# Patient Record
Sex: Male | Born: 1945
Health system: Southern US, Community
[De-identification: ages and names within clinical notes are randomized; demographics above are authoritative.]

## PROBLEM LIST (undated history)

## (undated) DIAGNOSIS — N62 Hypertrophy of breast: Secondary | ICD-10-CM

## (undated) DIAGNOSIS — R011 Cardiac murmur, unspecified: Secondary | ICD-10-CM

## (undated) DIAGNOSIS — K219 Gastro-esophageal reflux disease without esophagitis: Secondary | ICD-10-CM

## (undated) DIAGNOSIS — I080 Rheumatic disorders of both mitral and aortic valves: Secondary | ICD-10-CM

## (undated) DIAGNOSIS — C61 Malignant neoplasm of prostate: Secondary | ICD-10-CM

## (undated) DIAGNOSIS — F419 Anxiety disorder, unspecified: Secondary | ICD-10-CM

## (undated) DIAGNOSIS — Z86010 Personal history of colonic polyps: Principal | ICD-10-CM

## (undated) DIAGNOSIS — M4800 Spinal stenosis, site unspecified: Secondary | ICD-10-CM

## (undated) HISTORY — DX: Hypertrophy of breast: N62

## (undated) HISTORY — PX: POLYPECTOMY: SHX149

## (undated) HISTORY — PX: COLONOSCOPY: SHX174

## (undated) HISTORY — PX: TOOTH EXTRACTION: SUR596

## (undated) HISTORY — DX: Spinal stenosis, site unspecified: M48.00

## (undated) HISTORY — DX: Gastro-esophageal reflux disease without esophagitis: K21.9

## (undated) HISTORY — DX: Rheumatic disorders of both mitral and aortic valves: I08.0

## (undated) HISTORY — DX: Anxiety disorder, unspecified: F41.9

## (undated) HISTORY — DX: Malignant neoplasm of prostate: C61

## (undated) HISTORY — DX: Other disorders of bilirubin metabolism: E80.6

## (undated) HISTORY — DX: Cardiac murmur, unspecified: R01.1

## (undated) HISTORY — DX: Personal history of colonic polyps: Z86.010

---

## 1995-08-13 HISTORY — PX: OTHER SURGICAL HISTORY: SHX169

## 2004-10-10 ENCOUNTER — Ambulatory Visit: Payer: Self-pay | Admitting: Internal Medicine

## 2004-11-20 ENCOUNTER — Ambulatory Visit: Payer: Self-pay | Admitting: Internal Medicine

## 2005-11-21 ENCOUNTER — Ambulatory Visit: Payer: Self-pay | Admitting: Internal Medicine

## 2005-12-25 ENCOUNTER — Ambulatory Visit: Payer: Self-pay | Admitting: Internal Medicine

## 2006-04-25 ENCOUNTER — Ambulatory Visit: Payer: Self-pay | Admitting: Internal Medicine

## 2007-03-25 ENCOUNTER — Ambulatory Visit: Payer: Self-pay | Admitting: Internal Medicine

## 2007-03-25 DIAGNOSIS — R9431 Abnormal electrocardiogram [ECG] [EKG]: Secondary | ICD-10-CM | POA: Insufficient documentation

## 2007-03-26 ENCOUNTER — Encounter (INDEPENDENT_AMBULATORY_CARE_PROVIDER_SITE_OTHER): Payer: Self-pay | Admitting: *Deleted

## 2008-05-24 ENCOUNTER — Ambulatory Visit: Payer: Self-pay | Admitting: Internal Medicine

## 2008-05-24 DIAGNOSIS — K219 Gastro-esophageal reflux disease without esophagitis: Secondary | ICD-10-CM

## 2008-05-24 HISTORY — DX: Gastro-esophageal reflux disease without esophagitis: K21.9

## 2008-05-24 LAB — CONVERTED CEMR LAB
Bilirubin Urine: NEGATIVE
Glucose, Urine, Semiquant: NEGATIVE
Ketones, urine, test strip: NEGATIVE
Specific Gravity, Urine: <1.005
pH: 8

## 2008-05-25 ENCOUNTER — Ambulatory Visit: Payer: Self-pay | Admitting: Internal Medicine

## 2008-05-26 ENCOUNTER — Encounter (INDEPENDENT_AMBULATORY_CARE_PROVIDER_SITE_OTHER): Payer: Self-pay | Admitting: *Deleted

## 2008-09-24 LAB — HM COLONOSCOPY: HM Colonoscopy: NORMAL

## 2008-10-21 ENCOUNTER — Ambulatory Visit: Payer: Self-pay | Admitting: Internal Medicine

## 2008-10-21 DIAGNOSIS — R7303 Prediabetes: Secondary | ICD-10-CM | POA: Insufficient documentation

## 2008-10-21 HISTORY — DX: Other disorders of bilirubin metabolism: E80.6

## 2008-10-24 ENCOUNTER — Encounter (INDEPENDENT_AMBULATORY_CARE_PROVIDER_SITE_OTHER): Payer: Self-pay | Admitting: *Deleted

## 2008-10-31 ENCOUNTER — Ambulatory Visit: Payer: Self-pay | Admitting: Cardiovascular Disease

## 2008-11-07 ENCOUNTER — Telehealth (INDEPENDENT_AMBULATORY_CARE_PROVIDER_SITE_OTHER): Payer: Self-pay | Admitting: *Deleted

## 2008-11-08 ENCOUNTER — Ambulatory Visit: Payer: Self-pay

## 2008-11-08 ENCOUNTER — Encounter: Payer: Self-pay | Admitting: Cardiovascular Disease

## 2008-11-08 ENCOUNTER — Encounter: Payer: Self-pay | Admitting: Cardiology

## 2008-11-15 ENCOUNTER — Encounter: Payer: Self-pay | Admitting: Cardiovascular Disease

## 2008-11-15 DIAGNOSIS — I080 Rheumatic disorders of both mitral and aortic valves: Secondary | ICD-10-CM

## 2008-11-15 DIAGNOSIS — I491 Atrial premature depolarization: Secondary | ICD-10-CM | POA: Insufficient documentation

## 2008-11-15 HISTORY — DX: Rheumatic disorders of both mitral and aortic valves: I08.0

## 2008-11-16 ENCOUNTER — Encounter: Payer: Self-pay | Admitting: Cardiovascular Disease

## 2008-11-16 ENCOUNTER — Ambulatory Visit: Payer: Self-pay | Admitting: Cardiovascular Disease

## 2009-05-31 ENCOUNTER — Encounter: Payer: Self-pay | Admitting: Cardiovascular Disease

## 2009-05-31 ENCOUNTER — Ambulatory Visit: Payer: Self-pay | Admitting: Cardiovascular Disease

## 2009-06-26 ENCOUNTER — Telehealth (INDEPENDENT_AMBULATORY_CARE_PROVIDER_SITE_OTHER): Payer: Self-pay | Admitting: *Deleted

## 2009-06-28 ENCOUNTER — Encounter: Payer: Self-pay | Admitting: Internal Medicine

## 2009-07-28 ENCOUNTER — Encounter: Payer: Self-pay | Admitting: Internal Medicine

## 2009-08-01 ENCOUNTER — Encounter: Payer: Self-pay | Admitting: Internal Medicine

## 2009-08-12 HISTORY — PX: PROSTATECTOMY: SHX69

## 2009-08-29 ENCOUNTER — Encounter: Payer: Self-pay | Admitting: Internal Medicine

## 2009-09-06 ENCOUNTER — Telehealth: Payer: Self-pay | Admitting: Cardiovascular Disease

## 2009-09-14 ENCOUNTER — Inpatient Hospital Stay (HOSPITAL_COMMUNITY): Admission: RE | Admit: 2009-09-14 | Discharge: 2009-09-15 | Payer: Self-pay | Admitting: Urology

## 2009-09-14 ENCOUNTER — Encounter (INDEPENDENT_AMBULATORY_CARE_PROVIDER_SITE_OTHER): Payer: Self-pay | Admitting: Urology

## 2009-09-22 ENCOUNTER — Encounter: Payer: Self-pay | Admitting: Internal Medicine

## 2009-11-17 ENCOUNTER — Encounter: Payer: Self-pay | Admitting: Internal Medicine

## 2010-03-21 ENCOUNTER — Encounter: Payer: Self-pay | Admitting: Internal Medicine

## 2010-06-20 ENCOUNTER — Encounter: Payer: Self-pay | Admitting: Internal Medicine

## 2010-06-20 ENCOUNTER — Ambulatory Visit: Payer: Self-pay | Admitting: Internal Medicine

## 2010-06-20 DIAGNOSIS — Z8546 Personal history of malignant neoplasm of prostate: Secondary | ICD-10-CM | POA: Insufficient documentation

## 2010-06-25 LAB — CONVERTED CEMR LAB
Basophils Relative: 1 % (ref 0.0–3.0)
Bilirubin, Direct: 0.1 mg/dL (ref 0.0–0.3)
Direct LDL: 103.6 mg/dL
Eosinophils Absolute: 0.2 10*3/uL (ref 0.0–0.7)
Eosinophils Relative: 4.7 % (ref 0.0–5.0)
GFR calc non Af Amer: 94.84 mL/min (ref >60)
Glucose, Bld: 116 mg/dL — ABNORMAL HIGH (ref 70–99)
HDL: 98.1 mg/dL (ref >39.00)
Hemoglobin: 14.3 g/dL (ref 13.0–17.0)
Lymphocytes Relative: 24.3 % (ref 12.0–46.0)
MCHC: 34.1 g/dL (ref 30.0–36.0)
Neutrophils Relative %: 60.5 % (ref 43.0–77.0)
Platelets: 221 10*3/uL (ref 150.0–400.0)
Potassium: 4.6 meq/L (ref 3.5–5.1)
Sodium: 142 meq/L (ref 135–145)
TSH: 0.65 microintl units/mL (ref 0.35–5.50)
Total Bilirubin: 0.8 mg/dL (ref 0.3–1.2)

## 2010-06-28 LAB — CONVERTED CEMR LAB: Hgb A1c MFr Bld: 5.9 % (ref 4.6–6.5)

## 2010-09-09 LAB — CONVERTED CEMR LAB
ALT: 20 units/L (ref 0–53)
ALT: 33 units/L (ref 0–53)
AST: 23 units/L (ref 0–37)
AST: 33 units/L (ref 0–37)
Albumin: 3.9 g/dL (ref 3.5–5.2)
Alkaline Phosphatase: 55 units/L (ref 39–117)
BUN: 11 mg/dL (ref 6–23)
BUN: 14 mg/dL (ref 6–23)
Basophils Absolute: 0 10*3/uL (ref 0.0–0.1)
Basophils Relative: 0.1 % (ref 0.0–1.0)
Basophils Relative: 1 % (ref 0.0–3.0)
Bilirubin, Direct: 0.1 mg/dL (ref 0.0–0.3)
CO2: 32 meq/L (ref 19–32)
Calcium: 9.7 mg/dL (ref 8.4–10.5)
Chloride: 103 meq/L (ref 96–112)
Cholesterol: 191 mg/dL (ref 0–200)
Eosinophils Absolute: 0.2 10*3/uL (ref 0.0–0.6)
Eosinophils Absolute: 0.2 10*3/uL (ref 0.0–0.7)
Eosinophils Relative: 3.4 % (ref 0.0–5.0)
GFR calc Af Amer: 110 mL/min
GFR calc non Af Amer: 91 mL/min
Glucose, Bld: 103 mg/dL — ABNORMAL HIGH (ref 70–99)
HCT: 40.3 % (ref 39.0–52.0)
HDL: 85.5 mg/dL (ref >39.0)
HDL: 87.9 mg/dL (ref >39.0)
Hemoglobin: 13.2 g/dL (ref 13.0–17.0)
Lymphocytes Relative: 13.2 % (ref 12.0–46.0)
MCHC: 34.6 g/dL (ref 30.0–36.0)
MCV: 90.6 fL (ref 78.0–100.0)
MCV: 94.4 fL (ref 78.0–100.0)
Monocytes Absolute: 0.4 10*3/uL (ref 0.1–1.0)
Monocytes Relative: 6.7 % (ref 3.0–11.0)
PSA: 9.38 ng/mL — ABNORMAL HIGH (ref 0.10–4.00)
RBC: 4.27 M/uL (ref 4.22–5.81)
Sodium: 141 meq/L (ref 135–145)
TSH: 0.87 microintl units/mL (ref 0.35–5.50)
Total Bilirubin: 1.3 mg/dL — ABNORMAL HIGH (ref 0.3–1.2)
VLDL: 15 mg/dL (ref 0–40)
WBC: 4.5 10*3/uL (ref 4.5–10.5)

## 2010-09-11 NOTE — Letter (Signed)
Summary: [facility]  [facility]   Imported By: Lanelle Bal 12/04/2009 13:13:20  _____________________________________________________________________  External Attachment:    Type:   Image     Comment:   External Document

## 2010-09-11 NOTE — Letter (Signed)
Summary: [facility]  [facility]   Imported By: Lanelle Bal 09/29/2009 13:39:09  _____________________________________________________________________  External Attachment:    Type:   Image     Comment:   External Document

## 2010-09-11 NOTE — Letter (Signed)
Summary: [facility]  [facility]   Imported By: Lanelle Bal 03/30/2010 13:11:03  _____________________________________________________________________  External Attachment:    Type:   Image     Comment:   External Document

## 2010-09-11 NOTE — Progress Notes (Signed)
Summary: need surgical clearence  Phone Note From Other Clinic Call back at [phone] ext [phone]   Caller: Ruben Gottron from [facility]  Summary of Call: she is a physical therpist and she wants to do peri anal bio feed back and needs approval from cardiologist and they want this done before his prostate surgery Initial call taken by: Omer Jack,  September 06, 2009 1:36 PM  Follow-up for Phone Call        [facility] for biofeedback and prostate surgery Follow-up by: Colon Branch, MD, [facility],  September 07, 2009 11:44 AM     Appended Document: need surgical clearence NOTE FAXED

## 2010-09-11 NOTE — Letter (Signed)
Summary: [facility]  [facility]   Imported By: Lanelle Bal 09/08/2009 08:15:40  _____________________________________________________________________  External Attachment:    Type:   Image     Comment:   External Document
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
## 2010-09-13 NOTE — Letter (Signed)
Summary: Patient Questionnaire  Patient Questionnaire   Imported By: Lanelle Bal 08/31/2010 10:05:36  _____________________________________________________________________  External Attachment:    Type:   Image     Comment:   External Document

## 2010-09-26 ENCOUNTER — Encounter: Payer: Self-pay | Admitting: Internal Medicine

## 2010-10-09 NOTE — Letter (Signed)
Summary: [facility]  [facility]   Imported By: Maryln Gottron 10/02/2010 15:35:09  _____________________________________________________________________  External Attachment:    Type:   Image     Comment:   External Document

## 2010-10-29 LAB — BASIC METABOLIC PANEL
BUN: 11 mg/dL (ref 6–23)
CO2: 32 mEq/L (ref 19–32)
Calcium: 9.6 mg/dL (ref 8.4–10.5)
Chloride: 101 mEq/L (ref 96–112)
Glucose, Bld: 109 mg/dL — ABNORMAL HIGH (ref 70–99)
Potassium: 4.5 mEq/L (ref 3.5–5.1)

## 2010-10-29 LAB — CBC
HCT: 41.1 % (ref 39.0–52.0)
Hemoglobin: 14 g/dL (ref 13.0–17.0)
RBC: 4.35 MIL/uL (ref 4.22–5.81)
WBC: 5.1 10*3/uL (ref 4.0–10.5)

## 2010-11-01 ENCOUNTER — Encounter: Payer: Self-pay | Admitting: Internal Medicine

## 2010-11-01 ENCOUNTER — Ambulatory Visit (INDEPENDENT_AMBULATORY_CARE_PROVIDER_SITE_OTHER): Payer: Medicare Other | Admitting: Internal Medicine

## 2010-11-01 DIAGNOSIS — IMO0001 Reserved for inherently not codable concepts without codable children: Secondary | ICD-10-CM

## 2010-11-01 DIAGNOSIS — M545 Low back pain, unspecified: Secondary | ICD-10-CM

## 2010-11-01 DIAGNOSIS — IMO0002 Reserved for concepts with insufficient information to code with codable children: Secondary | ICD-10-CM

## 2010-11-01 DIAGNOSIS — M5418 Radiculopathy, sacral and sacrococcygeal region: Secondary | ICD-10-CM

## 2010-11-01 DIAGNOSIS — Z8546 Personal history of malignant neoplasm of prostate: Secondary | ICD-10-CM

## 2010-11-01 LAB — ABO/RH: ABO/RH(D): O POS

## 2010-11-01 LAB — TYPE AND SCREEN
ABO/RH(D): O POS
Antibody Screen: NEGATIVE

## 2010-11-01 LAB — HEMOGLOBIN AND HEMATOCRIT, BLOOD
HCT: 40 % (ref 39.0–52.0)
Hemoglobin: 12.2 g/dL — ABNORMAL LOW (ref 13.0–17.0)
Hemoglobin: 13.8 g/dL (ref 13.0–17.0)

## 2010-11-01 MED ORDER — CYCLOBENZAPRINE HCL 5 MG PO TABS: 5.0000 mg | ORAL_TABLET | Freq: Two times a day (BID) | ORAL | Status: DC | PRN

## 2010-11-01 MED ORDER — HYDROCODONE-ACETAMINOPHEN 7.5-500 MG PO TABS: 2.0000 | ORAL_TABLET | Freq: Four times a day (QID) | ORAL | Status: DC | PRN

## 2010-11-01 NOTE — Progress Notes (Signed)
  Subjective:    Patient ID: Edward Mccall, male    DOB: 16-Oct-1945, 65 y.o.   MRN: [id number]  HPI he presents with low back pain present for one week. It began in the context of lifting weights at a sports facility. He believes it is related to lifting weights and hyperextension the spine using some of the equipment. He has taken hydrocodone as well as nonsteroidals with only partial response. Additionally he is seen a chiropractor for adjustment. Films were made at that facility. He denies any significant past medical history of back problems except for intermittent back strain which was self resolving.     Review of Systems. He denies fever, chills ,sweats, or weight loss. He also denies any rash in the area of the pain.    the pain is described as sharp in the lumbosacral area and radiating into the left lower extremity as far as the ankle.   He denies any loss control of bladder or bowels    he denies abdominal pain, rectal bleeding, melena, or change in bowels.    he has a history of prostate cancer. He denies dysuria, hematuria, or pyuria.    Objective:   Physical Exam he is in obvious discomfort preferring to stand rather than to sit on the exam table. Sitting aggravates the pain particularly in the hamstring area.    he has no jaundice or scleral icterus. There is no rash in the area of the pain.   He is able to walk although he is in discomfort. He can walk on his tiptoes and heels. He is able to lie down on the exam table and sit up without help. Straight leg raising testing is negative at this time.  Deep tendon reflexes are normal and strength is equal and normal in all extremities. He exhibits no cyanosis clubbing or edema.   he has no lymphadenopathy.    he has no abdominal discomfort to palpation.         Assessment & Plan:   #1 he has an acute low back syndrome which has failed to respond to chiropractor  and limited pain medication administration. Clinically an  acute disc is not suggested as he is by his gait and negative straight leg raising.    #2 past medical history of prostate cancer. His last PSA approximately a month ago was undetectable.    plan: #1 he will be asked to take the narcotics on a regular basis of examining pain. Muscle relaxants will also be added. If there is not significant improvement then an   MRI  Of the lumbosacral spine will be performed. As noted although clinically there is no acute disc his history suggests an S1 radiculopathy intermittently.
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
## 2010-11-06 ENCOUNTER — Telehealth: Payer: Self-pay | Admitting: *Deleted

## 2010-11-06 DIAGNOSIS — M5418 Radiculopathy, sacral and sacrococcygeal region: Secondary | ICD-10-CM

## 2010-11-06 DIAGNOSIS — IMO0001 Reserved for inherently not codable concepts without codable children: Secondary | ICD-10-CM

## 2010-11-06 MED ORDER — CYCLOBENZAPRINE HCL 5 MG PO TABS: 5.0000 mg | ORAL_TABLET | Freq: Two times a day (BID) | ORAL | Status: DC | PRN

## 2010-11-06 MED ORDER — HYDROCODONE-ACETAMINOPHEN 7.5-500 MG PO TABS: 2.0000 | ORAL_TABLET | ORAL | Status: DC | PRN

## 2010-11-06 NOTE — Telephone Encounter (Signed)
Spoke w/ pt says that he would like to know what else he can take says Lortab isn't really helping and he is out of flexeril so would like to have refill on meds.

## 2010-11-06 NOTE — Telephone Encounter (Signed)
see referral for CT

## 2010-11-06 NOTE — Telephone Encounter (Signed)
A I have reviewed his medications; there is nothing stronger than the Lortab. Edward Mccall will be increased to every 4 hours. Based on the CT scan referral will be made to the neurosurgeon or to Dr. Ethelene Hal who  is a nonsurgical back specialist.

## 2010-11-06 NOTE — Telephone Encounter (Signed)
Spoke w/ pt aware prescriptions sent to pharmacy

## 2010-11-08 ENCOUNTER — Other Ambulatory Visit: Payer: Self-pay | Admitting: Internal Medicine

## 2010-11-08 ENCOUNTER — Ambulatory Visit
Admission: RE | Admit: 2010-11-08 | Discharge: 2010-11-08 | Disposition: A | Discharge status: Home / Self Care | Payer: Medicare Other | Source: Ambulatory Visit | Attending: Internal Medicine | Admitting: Internal Medicine

## 2010-11-08 ENCOUNTER — Telehealth: Payer: Self-pay | Admitting: Internal Medicine

## 2010-11-08 DIAGNOSIS — M5417 Radiculopathy, lumbosacral region: Secondary | ICD-10-CM

## 2010-11-08 DIAGNOSIS — M5416 Radiculopathy, lumbar region: Secondary | ICD-10-CM

## 2010-11-08 NOTE — Telephone Encounter (Signed)
CT order was place on wrong pt scheduled going for scan today.

## 2010-11-08 NOTE — Telephone Encounter (Signed)
Pt aware of information.  

## 2010-11-09 ENCOUNTER — Telehealth: Payer: Self-pay

## 2010-11-09 NOTE — Telephone Encounter (Signed)
Patient left message on voicemail requesting MRI results  I called patient back and Dr.Hopper gave him the details of his report. Copy to be mailed to patient

## 2010-11-11 HISTORY — PX: LUMBAR DISC SURGERY: SHX700

## 2010-11-13 ENCOUNTER — Telehealth: Payer: Self-pay | Admitting: Internal Medicine

## 2010-11-13 ENCOUNTER — Other Ambulatory Visit: Payer: Self-pay | Admitting: Internal Medicine

## 2010-11-13 DIAGNOSIS — M5416 Radiculopathy, lumbar region: Secondary | ICD-10-CM

## 2010-11-13 MED ORDER — FENTANYL 50 MCG/HR TD PT72: 1.0000 | MEDICATED_PATCH | TRANSDERMAL | Status: AC

## 2010-11-13 NOTE — Telephone Encounter (Signed)
Pt is aware rx is ready °

## 2010-11-13 NOTE — Telephone Encounter (Signed)
As of today, 11-13-2010, patient records still being reviewed by [facility], I informed patient of this, he is aware & is hopeful that Dr. Alwyn Ren can prescribe something different and stronger for the pain until he gets an appt w/Neurosurgery please.

## 2010-11-13 NOTE — Telephone Encounter (Signed)
Fentanyl 50 mg patch every 3 days was prescribed, dispense 5

## 2010-11-13 NOTE — Telephone Encounter (Signed)
Message left on voicemail, Patient is out of pain med and it did not really help. Patient would like to know if there is any other med Dr.Hopper can recommend.  Dr.Hopper please advise

## 2010-11-13 NOTE — Telephone Encounter (Signed)
Patient called to check on status of his referral for back pain that extends down his legs---says he is in constant pain---is there any way to "hurry " this referral----says he is being referred to Dr Danielle Dess (sp?)---please call him if you can do anything for him

## 2010-12-10 ENCOUNTER — Inpatient Hospital Stay (HOSPITAL_COMMUNITY): Payer: Medicare Other

## 2010-12-10 ENCOUNTER — Inpatient Hospital Stay (HOSPITAL_COMMUNITY)
Admission: RE | Admit: 2010-12-10 | Discharge: 2010-12-10 | DRG: 491 | Disposition: A | Discharge status: Home / Self Care | Payer: Medicare Other | Source: Ambulatory Visit | Attending: Neurological Surgery | Admitting: Neurological Surgery

## 2010-12-10 DIAGNOSIS — I059 Rheumatic mitral valve disease, unspecified: Secondary | ICD-10-CM | POA: Diagnosis present

## 2010-12-10 DIAGNOSIS — M47817 Spondylosis without myelopathy or radiculopathy, lumbosacral region: Secondary | ICD-10-CM | POA: Diagnosis present

## 2010-12-10 DIAGNOSIS — K219 Gastro-esophageal reflux disease without esophagitis: Secondary | ICD-10-CM | POA: Diagnosis present

## 2010-12-10 DIAGNOSIS — M5126 Other intervertebral disc displacement, lumbar region: Principal | ICD-10-CM | POA: Diagnosis present

## 2010-12-10 DIAGNOSIS — Z87891 Personal history of nicotine dependence: Secondary | ICD-10-CM

## 2010-12-10 LAB — CBC
HCT: 39.9 % (ref 39.0–52.0)
MCH: 30.9 pg (ref 26.0–34.0)
MCV: 89.5 fL (ref 78.0–100.0)
RDW: 12.3 % (ref 11.5–15.5)

## 2010-12-10 LAB — SURGICAL PCR SCREEN
MRSA, PCR: NEGATIVE
Staphylococcus aureus: POSITIVE — AB

## 2010-12-25 NOTE — Assessment & Plan Note (Signed)
[facility]                            CARDIOLOGY OFFICE NOTE   NAME:Edward Mccall, CINCH ORMOND                      MRN:          [id number]  DATE:10/31/2008                            DOB:          09/30/1945    REFERRING PHYSICIAN:  Titus Dubin. Alwyn Ren, MD,FACP,FCCP   A 65 year old patient referred by Dr. Alwyn Ren for an abnormal EKG.   HISTORY OF PRESENT ILLNESS:  The patient has previously had some  palpitations.  Reviewing his old chart, he has had an event monitor  which showed an occasional PSVT and short burst of PAC.  However, he has  not been on chronic therapy for this.   He did have a nuclear study back in 2000, which was normal with an EF of  53%.   I do not have an old echocardiogram in the chart.   The patient is extremely active.  He works out on a regular basis.  He  is interested in getting back in the triathlons.  He rides a bike on a  regular basis.   He gets occasional shortness of breath, which is measured with the  activity that he does.  He indicates from time to time, he gets a stitch  in his chest, this is actually not described as pain or pressure.  It is  actually more at rest and with stress.   The patient has not had any associated palpitations, diaphoresis,  presyncope, or lower extremity edema.  He does have a history of murmur.   This has not been fully elucidated.   However, when I mentioned it to him, he seemed to indicate that he knew  he has had a murmur in the past.   The patient also indicates that he can drink alcohol on a regular basis.   His father was an alcoholic.   After talking to him at length, I think it may be an issue in regards to  possible cardiomyopathy.   The patient otherwise has been stable in regards to his weight.  He quit  smoking many years ago.   REVIEW OF SYSTEMS:  Otherwise negative.   PAST MEDICAL HISTORY:  Remarkable for previous broken right fibula and  tibial with a plate.   History of BPH, history of GERD, previous colonic  polypectomy, and previous vasectomy.   ALLERGIES:  He has no known allergies.   FAMILY HISTORY:  Father being alcoholic.  Mother having hypertension.   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  He is married.  He has 2 older children.  He does not smoke.  He probably drinks too much, various beverages including beer, Scotch  and mixed drinks.  He can drink particularly heavily on the weekends.  He is a very physically active and fit.   MEDICATIONS:  Include  1. Prilosec.  2. Aspirin.  3. Multivitamins.  4. Fish oil.  5. Glucosamine.  6. Saw palmetto.   PHYSICAL EXAMINATION:  GENERAL:  Remarkable for healthy-appearing male,  in no distress.  VITAL SIGNS:  Blood pressure is 128/70, pulse  70 and regular,  respiratory rate 14, and afebrile.  HEENT:  Unremarkable.  NECK:  Carotids normal without bruit.  No lymphadenopathy, thyromegaly,  or JVP elevation.  LUNGS:  Clear.  Good diaphragmatic motion.  No wheezing.  S1 and S2 with  a systolic ejection murmur.  PMI normal.  ABDOMEN:  Benign.  Bowel sounds positive.  No AAA, no tenderness, no  bruit, no hepatosplenomegaly, and no hepatojugular reflux.  EXTREMITIES:  Distal pulses are intact.  No edema.  NEUROLOGIC:  Nonfocal.  SKIN:  Warm and dry.  MUSCULOSKELETAL:  No muscular weakness status post broken right lower  extremity fibula and tibial.   Dr. Frederik Pear EMR notes were reviewed.   LABORATORY DATA:  Lab work were reviewed.  His LDL is 104, hematocrit  was 40, and creatinine is 1.0.   Electrocardiogram done at Dr. Frederik Pear office is fairly markedly  abnormal.  His EKG back in 2000-2002 appeared normal.  His current EKG  shows fairly marked T-wave inversions in the inferolateral leads.  It is  not clearly related to LVH, T-wave inversion is most remarked in II, III  and F, V5 and V6.   IMPRESSION:  1. Abnormal EKG.  This is a fairly marked  change from before.  We need      to rule out coronary artery disease.  I do not think the stitches      that he is having in his chest are related.  I think these may be      more related to GERD.  Since he has a baseline abnormal EKG, he      will have an exercise stress thallium study.  2. Murmur.  This may be related to his EKG changes as well.  I do not      think he has significant aortic stenosis.  The murmur is actually      more apical and may represent MR.  He will have a 2-D      echocardiogram to further assess this.  3. History of palpitations, relatively stable.  Previous monitor      showing short burst of PSVT.  No indication for beta-blocker      therapy at this time.  4. Alcohol.  Follow up with Dr. Alwyn Ren.  The patient appears to be      drinking too much.  He has an alcoholic history in terms of his      father's previous history.  I would be little bit concerned about      this and I did talk to the patient about this in any relationship      to possible cardiomyopathy.  5. History of gastroesophageal reflux disease, may be related to      alcohol usage.  Continue over-the-counter Prilosec.   Further recommendations will based on his stress test and  echocardiogram.     Theron Arista C. Eden Emms, MD, [facility]  Electronically Signed    PCN/MedQ  DD: 10/31/2008  DT: 11/01/2008  Job #: [id number]

## 2011-01-02 ENCOUNTER — Ambulatory Visit (INDEPENDENT_AMBULATORY_CARE_PROVIDER_SITE_OTHER): Payer: Medicare Other | Admitting: Family Medicine

## 2011-01-02 ENCOUNTER — Encounter: Payer: Self-pay | Admitting: Family Medicine

## 2011-01-02 DIAGNOSIS — M4800 Spinal stenosis, site unspecified: Secondary | ICD-10-CM

## 2011-01-02 DIAGNOSIS — F419 Anxiety disorder, unspecified: Secondary | ICD-10-CM

## 2011-01-02 DIAGNOSIS — F411 Generalized anxiety disorder: Secondary | ICD-10-CM

## 2011-01-02 DIAGNOSIS — Z8546 Personal history of malignant neoplasm of prostate: Secondary | ICD-10-CM

## 2011-01-02 HISTORY — DX: Spinal stenosis, site unspecified: M48.00

## 2011-01-02 HISTORY — DX: Anxiety disorder, unspecified: F41.9

## 2011-01-02 MED ORDER — VENLAFAXINE HCL ER 37.5 MG PO CP24: 75.0000 mg | ORAL_CAPSULE | Freq: Every day | ORAL | Status: DC

## 2011-01-02 MED ORDER — GABAPENTIN 100 MG PO CAPS: 200.0000 mg | ORAL_CAPSULE | Freq: Every day | ORAL | Status: DC

## 2011-01-02 NOTE — Assessment & Plan Note (Signed)
Discussed with pt that this all stems from him losing part of his identity since not being able to cycle as much, warned him it will be a long road but likely will improve. Pt will start on effexor and will titrate up, has valium on hand if needed will return in 2-3 weeks.  Told pt of red flags to look out for.

## 2011-01-02 NOTE — Progress Notes (Signed)
  Subjective:    Patient ID: Edward Mccall, male    DOB: 18-Jun-1946, 65 y.o.   MRN: [id number]  HPI  Pt here to establish care changing providers for PCP.  Main topics of todays visit.   1.  Spinal stenosis-  Pt did have operation on back three weeks ago when having incredible pain and foot drop. Since then had been doing better increased activity again to some planks and rode his bicycle the other day without much problems.  Starting PT this Friday and excited to become more active.  Pt though is still having a lot of pain in the morning, states it is agony and takes about 20-30 minutes to loosen up to allow for him to go about his day.  Pt has tried changing positions while sleeping without much help.  Pt states during the day he has done well and is not taking the meloxicam, percocet or any other pain meds unless he really needs to.  Pt denies any bowel or bladder problems.  Pt does state his left foot does still having a tingling sensation but it has gotten all the strength back. Pt really does not want to be on medications if he can help it.   2.  Anxiety and depression  Pt states that since his surgery and not being as active as he used to be he is having these overwhelming feelings of depression that come and go day by day.  He also feels like he has too much energy and does not know what to do with it. Pt denies  Suicidal and Homicidal ideation  But is not sleeping well, has become teary eyed from time to time.  Pt has not been on any medications before and feels exercise used to help but cannot do it as much.  Pt would be open to see if there is anything that could help.   Review of Systems Denies fever, chills, nausea vomiting abdominal pain, dysuria, chest pain, shortness of breath dyspnea on exertion or numbness in extremities    Past medical history, social, surgical and family history all reviewed without changes.   Objective:   Physical Exam    Gen: NAD HEENT: EOMI, PERRLA CV:  RRR 1/6 SEM Pul: CTAB  ABd: Soft NT Ext: no edema, NVI, full 5/5 strength in all extremities all reflexes in tact, no foot drop appreciated.  Back:  Pt does have incision well healing in mid lumbar region, no pus minimal erythema from healing. Good ROM.  Sensation intact    Assessment & Plan:

## 2011-01-02 NOTE — Assessment & Plan Note (Signed)
S/p surgery, seems to be doing excellent, see his surgeon, no red flags but with the pain he is talking about will have pt start neurontin for short amount of time, pt did not want steroids which could have been some benefit if from swelling. Pt will return in two weeks and we will see how pt is doing.

## 2011-01-02 NOTE — Assessment & Plan Note (Signed)
Discussed with pt did not want to get any labs at this time, no complaints and no problems since surgery.

## 2011-01-02 NOTE — Patient Instructions (Signed)
Edward Mccall it is great we finally got you in I am giving you 2 medicines The first is called neurontin.  I want you to take 1 pill at night for the next week then two pills at night thereafter.  I want you to start Effexor as well.  Take `1 pill daily for the next week then 2 pills daily thereafter Take the percocet if you need it Take the valium if you really need it as well I want to see you again in 2-3 weeks and make sure everything is going well.

## 2011-01-11 HISTORY — PX: LUMBAR DISC SURGERY: SHX700

## 2011-01-24 ENCOUNTER — Other Ambulatory Visit: Payer: Self-pay | Admitting: Neurological Surgery

## 2011-01-24 DIAGNOSIS — M5136 Other intervertebral disc degeneration, lumbar region: Secondary | ICD-10-CM

## 2011-01-24 DIAGNOSIS — M5416 Radiculopathy, lumbar region: Secondary | ICD-10-CM

## 2011-01-28 ENCOUNTER — Ambulatory Visit
Admission: RE | Admit: 2011-01-28 | Discharge: 2011-01-28 | Disposition: A | Discharge status: Home / Self Care | Payer: Medicare Other | Source: Ambulatory Visit | Attending: Neurological Surgery | Admitting: Neurological Surgery

## 2011-01-28 DIAGNOSIS — M5136 Other intervertebral disc degeneration, lumbar region: Secondary | ICD-10-CM

## 2011-01-28 DIAGNOSIS — M5416 Radiculopathy, lumbar region: Secondary | ICD-10-CM

## 2011-01-28 MED ORDER — GADOBENATE DIMEGLUMINE 529 MG/ML IV SOLN
15.0000 mL | Freq: Once | INTRAVENOUS | Status: AC | PRN
  Administered 2011-01-28: 15 mL via INTRAVENOUS

## 2011-02-02 ENCOUNTER — Other Ambulatory Visit (HOSPITAL_COMMUNITY): Payer: Self-pay | Admitting: Neurological Surgery

## 2011-02-02 ENCOUNTER — Ambulatory Visit (HOSPITAL_COMMUNITY)
Admission: EM | Admit: 2011-02-02 | Discharge: 2011-02-02 | Disposition: A | Discharge status: Home / Self Care | Payer: Medicare Other | Source: Ambulatory Visit | Attending: Neurological Surgery | Admitting: Neurological Surgery

## 2011-02-02 ENCOUNTER — Ambulatory Visit (HOSPITAL_COMMUNITY): Admission: EM | Admit: 2011-02-02 | Payer: Medicare Other | Source: Ambulatory Visit | Admitting: Neurological Surgery

## 2011-02-02 ENCOUNTER — Ambulatory Visit (HOSPITAL_COMMUNITY)
Admission: RE | Admit: 2011-02-02 | Discharge: 2011-02-02 | Disposition: A | Discharge status: Home / Self Care | Payer: Medicare Other | Source: Ambulatory Visit | Attending: Neurological Surgery | Admitting: Neurological Surgery

## 2011-02-02 ENCOUNTER — Ambulatory Visit (HOSPITAL_COMMUNITY): Payer: Medicare Other

## 2011-02-02 DIAGNOSIS — M545 Low back pain, unspecified: Secondary | ICD-10-CM

## 2011-02-02 DIAGNOSIS — Z01810 Encounter for preprocedural cardiovascular examination: Secondary | ICD-10-CM | POA: Insufficient documentation

## 2011-02-02 DIAGNOSIS — R52 Pain, unspecified: Secondary | ICD-10-CM

## 2011-02-02 DIAGNOSIS — Z01812 Encounter for preprocedural laboratory examination: Secondary | ICD-10-CM | POA: Insufficient documentation

## 2011-02-02 DIAGNOSIS — M5126 Other intervertebral disc displacement, lumbar region: Secondary | ICD-10-CM | POA: Insufficient documentation

## 2011-02-02 DIAGNOSIS — Z79899 Other long term (current) drug therapy: Secondary | ICD-10-CM | POA: Insufficient documentation

## 2011-02-02 LAB — BASIC METABOLIC PANEL
Chloride: 103 mEq/L (ref 96–112)
GFR calc Af Amer: >60 mL/min (ref >60)
Potassium: 3.8 mEq/L (ref 3.5–5.1)

## 2011-02-02 LAB — CBC
Platelets: 184 10*3/uL (ref 150–400)
RDW: 12.3 % (ref 11.5–15.5)
WBC: 4.8 10*3/uL (ref 4.0–10.5)

## 2011-02-14 NOTE — Op Note (Signed)
[name], Mccall               ACCOUNT NO.:  [id number]  MEDICAL RECORD NO.:  [id number]           PATIENT TYPE:  I  LOCATION:  3524                         FACILITY:  [facility]  PHYSICIAN:  Stefani Dama, M.D.  DATE OF BIRTH:  1946/07/06  DATE OF PROCEDURE:  12/10/2010 DATE OF DISCHARGE:  12/10/2010                              OPERATIVE REPORT   PREOPERATIVE DIAGNOSIS:  Herniated nucleus pulposus at L4-L5 on the left with spondylosis and left lumbar radiculopathy including foot drop.  POSTOPERATIVE DIAGNOSIS:  Herniated nucleus pulposus at L4-L5 on the left with spondylosis and left lumbar radiculopathy including footdrop.  PROCEDURE:  Microdiskectomy at L4-L5 on the left with operating microscope and microdissection technique.  SURGEON:  Stefani Dama, MD  FIRST ASSISTANT:  Clydene Fake, MD  ANESTHESIA:  General endotracheal.  INDICATIONS:  Edward Mccall is a 65 year old individual who has had significant back and left lower extremity pain with weakness including a footdrop.  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  In addition, the pain has been insurmountable despite efforts at conservative treatment.  He has been requiring high doses of oral narcotic analgesics to maintain some level of comfort which is very transient.  The patient is taken to the operating room to undergo surgical decompression.  PROCEDURE:  The patient was brought to the operating room supine on the stretcher.  After smooth induction of general endotracheal anesthesia, he was turned prone.  Back was prepped with alcohol and DuraPrep and draped in a sterile fashion.  A midline incision was created and carried down through the lumbodorsal fascia after localizing the L4-L5 space. Dissection was then carried out subperiosteally at L4-L5 to allow placement of a self-retaining retractor.  A laminotomy was then created removing the inferior margin of lamina of L4 out to and  including the medial wall of the facet.  The yellow ligament was identified and this was then taken up.  It was noted to be very thickened and redundant.  As the lateral recess was uncovered, it was noted be flattening at the takeoff of the L5 nerve root and with further dissection we were able to mobilize the L5 nerve root off a mass which was noted to be a significant protrusion of the disk right over the area of the disk space.  This was incised vertically and under modest amount of pressure some fragments of severely degenerating and desiccated disk material were encountered.  These were removed and allowed for good decompression of the nerve root.  Further exploration, however, yielded that there was direct communication with the disk space and then a diskectomy was ensued removing continual significant fragmented desiccated disk material from within the disk space.  Ultimately, a substantial diskectomy of the disk space was performed removing all of the fragments of degenerated disk material both medially and laterally from within thedisk space.  The space collapsed a bit and the lateral recess for the L4 nerve root was explored.  Some ligamentous material was taken up in this area with 2 and 3 mm Kerrison punch.  Dissection was then carried out laterally and  hemostasis was achieved.  The wound was irrigated copiously with antibiotic irrigating solution and then the common dural tube and the L5 nerve root was well decompressed.  The L4 nerve root was well decompressed and with this final exploration was undertaken.  At this point, it was noted that there was a small arachnoidal appearance at the dura near the midline.  The bone around this area was enlarged and this was then closed with a singular horizontal mattress suture.  No CSF leak was encountered, however, to be certain that this would remain closed a small layer of DuraSeal was laid over this area.  Further exploration  yielded no other rents in the dura and again identified good decompression of the nerve roots.  The wound was then copiously irrigated with antibiotic irrigating solution.  The lumbodorsal fascia was closed with 2-0 Vicryl in an interrupted fashion, 2-0 Vicryl was used on the subcuticular tissues, and Dermabond was placed in the skin. 10 mL of 0.5% Marcaine was injected into the paraspinous musculature for analgesia.  The patient tolerated the procedure well and was returned to the recovery room in stable condition.     Stefani Dama, M.D.     Merla Riches  D:  12/10/2010  T:  12/10/2010  Job:  [id number]  Electronically Signed by Barnett Abu M.D. on 02/14/2011 04:54:04 PM

## 2011-02-14 NOTE — Op Note (Signed)
Edward Mccall, Edward Mccall               ACCOUNT NO.:  [id number]  MEDICAL RECORD NO.:  [id number]  LOCATION:                                 FACILITY:  PHYSICIAN:  Stefani Dama, M.D.  DATE OF BIRTH:  1945/10/19  DATE OF PROCEDURE:  02/02/2011 DATE OF DISCHARGE:                              OPERATIVE REPORT   PREOPERATIVE DIAGNOSIS:  Recurrent herniated nucleus pulposus, L4-L5 left with left lumbar radiculopathy.  POSTOPERATIVE DIAGNOSIS:  Recurrent herniated nucleus pulposus, L4-L5 left with left lumbar radiculopathy.  OPERATION:  Repeat microdiskectomy, L4-L5 with operating microscope, microdissection technique.  SURGEON:  Stefani Dama, MD  FIRST ASSISTANT:  Clydene Fake, MD  ANESTHESIA:  General endotracheal.  INDICATIONS:  Edward Mccall is a 65 year old individual who had a herniated nucleus pulposus diagnosed back in April, had severe unrelenting pain with evidence of a footdrop, underwent surgical diskectomy and things seemed to be improving for the first 3-4 weeks' time.  Then, he developed a fairly sudden and severe recurrence of pain in the left buttock and left lower extremity.  It was not responsive to conservative efforts and we repeated an MRI which demonstrated a large recurrent extruded fragment of disk at L4-L5 on the left side.  He is taken to the operating room at this time to undergo re-decompression.  PROCEDURE:  The patient was brought to the operating room supine on the stretcher.  After smooth induction of general endotracheal anesthesia, he was carefully turned into the prone position.  Bony prominences were appropriately padded and protected.  Back was prepped with alcohol and DuraPrep and draped in a sterile fashion.  The previous incision was reopened and dissection was carried down through the lumbodorsal fascia which was opened on the left side in the midline.  Paraspinous musculature was stripped in the subperiosteal fashion.  A  self-retaining retractor was placed in the wound.  The site of the previous laminotomy was then carefully dissected using a curette.  We freed the edges of some dense adherent scar tissue.  As the scar tissue was removed and dissection proceeded, we identified the dura by carefully dissecting along the lateral aspects of the dura on the outer edges of the laminotomy.  We were able to mobilize the common dural tube, however, on the most inferior aspect as the dura curved under, a small amount of spinal fluid was seen and it was noted that the dura in this area was rather thin and somewhat shredding.  The hole was not clear cut with distinct borders.  It was tamponaded with the Gelfoam and a cottonoid patty.  We proceeded with the dissection.  We were able to identify a large fragment of disk.  Procedure, at this point, was done under the operating microscope with Dr. Phoebe Perch assisting with the dissection and retraction while I worked to free fragment of disk which ultimately ended up this being at first a singular large fragment.  This was followed by many other smaller fragments of disk in the area of the laminotomy and into the disk space itself.  The disk space itself was cleared of a substantial quantity of significantly degenerated disk material and this process continued  to evacuate the disk space until all the fragments of disk that could be obtained from both medial and lateral aspects of the dissection were obtained.  Then, we carefully dissected underneath the L5 nerve root in the region of the foramen. Some other small fragments of disk were identified here and these were removed.  When we were assured that the common dural tube in the L5 nerve root were well decompressed, we then inspected the area of the spinal fluid leak.  It was noted here that the dura was thin and had somewhat of a shredded characteristic and was on the undersurface and far lateral margin of the dura.  It  seemed to tamponade itself when left alone.  Therefore, it was decided that no stitch could be placed into the dura and after a thorough decompression, hemostasis was established. We used the DuraSeal over this area to seal the leak.  Once the DuraSeal was poured, no spinal fluid was noted to be in the vicinity.  We then removed the lumbodorsal retractor and closed the fascia tightly with #1 and #2-0 Vicryl sutures.  20 mL of Exparel long-acting local anesthetic was injected into the paraspinous fascia.  With the fascia being closed tightly, second subcutaneous layer was placed with 2-0 Vicryl and a subcuticular layer was used to close the skin with 3-0 Vicryl. Dermabond was used to seal the skin.  Blood loss for the entire procedure was estimated at 50 mL.  The patient tolerated the procedure well and was returned to recovery room in stable condition.     Stefani Dama, M.D.     Merla Riches  D:  02/02/2011  T:  02/02/2011  Job:  [id number]  Electronically Signed by Barnett Abu M.D. on 02/14/2011 04:54:09 PM

## 2011-04-04 ENCOUNTER — Encounter: Payer: Self-pay | Admitting: Family Medicine

## 2011-04-05 ENCOUNTER — Ambulatory Visit (INDEPENDENT_AMBULATORY_CARE_PROVIDER_SITE_OTHER): Payer: Medicare Other | Admitting: Family Medicine

## 2011-04-05 DIAGNOSIS — M4800 Spinal stenosis, site unspecified: Secondary | ICD-10-CM

## 2011-04-05 DIAGNOSIS — Z8546 Personal history of malignant neoplasm of prostate: Secondary | ICD-10-CM

## 2011-04-05 DIAGNOSIS — F419 Anxiety disorder, unspecified: Secondary | ICD-10-CM

## 2011-04-05 DIAGNOSIS — F411 Generalized anxiety disorder: Secondary | ICD-10-CM

## 2011-04-05 DIAGNOSIS — D179 Benign lipomatous neoplasm, unspecified: Secondary | ICD-10-CM

## 2011-04-06 ENCOUNTER — Encounter: Payer: Self-pay | Admitting: Family Medicine

## 2011-04-06 DIAGNOSIS — D179 Benign lipomatous neoplasm, unspecified: Secondary | ICD-10-CM | POA: Insufficient documentation

## 2011-04-06 NOTE — Assessment & Plan Note (Signed)
About 4 cm today, told pt likely will need surgical removal, is causing discomfort so is indicated.  Pt though would like to wait until done with PT.

## 2011-04-06 NOTE — Progress Notes (Signed)
  Subjective:    Patient ID: Edward Mccall, male    DOB: 05-May-1946, 65 y.o.   MRN: [id number]  HPI 65 yo male with 1. Spinal stenosis-  Pt over the coarse of the last 6 months has had 2 back surgeries, the pain is finally improving, will be starting formal PT here next week, has been walking 3-5 miles a day still very stiff in AM for about an hour then better, is starting to ride his bicycle again as well which is his main love. Denies and radiculopathy and no bowel or bladder problems.   2.  Hx of prostate cancer, saw urologist recently (note scanned in ) no signs of any problems doing well continue to follow up every 6 months.   3.  Depression-  Pt has more anxiety then depression did come off the valium which was difficult but has stopped taking it. Pt though is continuing to take his medication Effexor and is happy with the dose.  Denies  Suicidal and Homicidal ideation    4.  Pt has lump on left bicep has had it for some time, has grown recently and decided that it has started to hurt some, pt though wanted me to be aware of it and look at it and would like it remove in the future but not until he becomes more active with his daily regimen.    Review of Systems Past medical, surgical and family hx were reviewed with no changes. Denies fever, chills, nausea vomiting abdominal pain, dysuria, chest pain, shortness of breath dyspnea on exertion or numbness in extremities     Objective:   Physical Exam Gen: NAD CV: RRR no murmur no PVC Pul: CTAB Abd: SOft NT, ND Ext: DTR intact, strength 5/5 in both legs. neurovascularly intact  No clonus. Left arm 4 cm lipoma, minimally tender to palpation, mobile no erythema no bruising      Assessment & Plan:  Anxiety Pt is doing very well with his Effexor does not want to increase dose, told him when he comes off likely will need to titrate down as well.   Spinal stenosis Improving s/p surgery, continue to follow up with surgeon. Pt knows of  red flags to look out for, will keep me inform with his PT regimen.   PROSTATE CANCER, HX OF Pt doing well, continue to see urologist.  Lipoma About 4 cm today, told pt likely will need surgical removal, is causing discomfort so is indicated.  Pt though would like to wait until done with PT.

## 2011-04-06 NOTE — Assessment & Plan Note (Signed)
Pt is doing very well with his Effexor does not want to increase dose, told him when he comes off likely will need to titrate down as well.

## 2011-04-06 NOTE — Assessment & Plan Note (Signed)
Pt doing well, continue to see urologist.

## 2011-04-06 NOTE — Assessment & Plan Note (Signed)
Improving s/p surgery, continue to follow up with surgeon. Pt knows of red flags to look out for, will keep me inform with his PT regimen.

## 2011-05-27 ENCOUNTER — Telehealth: Payer: Self-pay | Admitting: Family Medicine

## 2011-05-27 NOTE — Telephone Encounter (Signed)
Edward Mccall is calling because he has been having anxiety attacks and he wants to know if it could be from the medication he was taking.

## 2011-05-28 NOTE — Telephone Encounter (Signed)
Appt made for a physical on Jun 25, 2011 per pt request.  Would like to know if MD can put in orders for him to come in one week earlier to have fasting labs.  Also want to know if stopping the effexor one month ago could be causing what he describes as anxiety attacks.  Will forward to MD .Milas Gain, Maryjo Rochester

## 2011-05-29 NOTE — Telephone Encounter (Signed)
Called patient back patient is doing very well only had anxiety for approximately 48 hours thinks is more circumstantial and secondary to the pain that he is still continuing to have after his surgery for spinal stenosis.  Patient though able to do all activities of daily living he did taper down and is off his Effexor at this time patient is going to come in for physical on November 13

## 2011-06-25 ENCOUNTER — Encounter: Payer: Self-pay | Admitting: Family Medicine

## 2011-06-25 ENCOUNTER — Ambulatory Visit (INDEPENDENT_AMBULATORY_CARE_PROVIDER_SITE_OTHER): Payer: Medicare Other | Admitting: Family Medicine

## 2011-06-25 DIAGNOSIS — R7309 Other abnormal glucose: Secondary | ICD-10-CM

## 2011-06-25 DIAGNOSIS — IMO0001 Reserved for inherently not codable concepts without codable children: Secondary | ICD-10-CM

## 2011-06-25 DIAGNOSIS — M545 Low back pain, unspecified: Secondary | ICD-10-CM

## 2011-06-25 DIAGNOSIS — R030 Elevated blood-pressure reading, without diagnosis of hypertension: Secondary | ICD-10-CM | POA: Insufficient documentation

## 2011-06-25 DIAGNOSIS — M5418 Radiculopathy, sacral and sacrococcygeal region: Secondary | ICD-10-CM

## 2011-06-25 DIAGNOSIS — Z13220 Encounter for screening for lipoid disorders: Secondary | ICD-10-CM

## 2011-06-25 DIAGNOSIS — M4800 Spinal stenosis, site unspecified: Secondary | ICD-10-CM

## 2011-06-25 DIAGNOSIS — I491 Atrial premature depolarization: Secondary | ICD-10-CM

## 2011-06-25 DIAGNOSIS — IMO0002 Reserved for concepts with insufficient information to code with codable children: Secondary | ICD-10-CM

## 2011-06-25 LAB — LIPID PANEL
Cholesterol: 223 mg/dL — ABNORMAL HIGH (ref 0–200)
Triglycerides: 78 mg/dL (ref <150)
VLDL: 16 mg/dL (ref 0–40)

## 2011-06-25 LAB — COMPREHENSIVE METABOLIC PANEL
CO2: 30 mEq/L (ref 19–32)
Glucose, Bld: 118 mg/dL — ABNORMAL HIGH (ref 70–99)
Sodium: 142 mEq/L (ref 135–145)
Total Bilirubin: 0.7 mg/dL (ref 0.3–1.2)
Total Protein: 6.7 g/dL (ref 6.0–8.3)

## 2011-06-25 MED ORDER — CYCLOBENZAPRINE HCL 5 MG PO TABS: 5.0000 mg | ORAL_TABLET | Freq: Two times a day (BID) | ORAL | Status: DC | PRN

## 2011-06-25 NOTE — Assessment & Plan Note (Signed)
Patient will have a fasting lipid panel drawn today I'll call patient with results.

## 2011-06-25 NOTE — Assessment & Plan Note (Signed)
She continues to have these flares. Likely to do him actually overexerting himself. Discussed with patient the proper exercises as well as flexion and rotation component that he needs to do to increase his flexibility. Also states that he needs to focus on stretching the psoas muscle whenever possible. In addition to that discussed that possibly crosstraining maybe better some not to overwhelm anyone muscle group. Patient will try to do some swimming as well as biking as well as some other potential crosstraining to try to help decrease the amount of tightness that the patient gets with his hip flexors. Patient continue take the muscle relaxers as needed and have the hydrocodone on hand as needed as well. We will continue to monitor no red flags at this time

## 2011-06-25 NOTE — Progress Notes (Signed)
  Subjective:    Patient ID: Edward Mccall, male    DOB: [birth year], 65 y.o.   MRN: [id number]  HPI 65 yo male with preventive care visit 1. Spinal stenosis-  Pt over the coarse of the last 6 months has had 2 back surgeries, the pain is finally improving, patient has now done most of his physical therapy and has increased his biking up to approximately 100 miles per week and had been doing very good until last weekend when he did have a setback. Patient went along 48 mile bike ride.his bike to take a rest when try to get up he had significant back pain left-sided radiating down the left leg like he did before. Patient states though since that time he was taken riding off for couple days and the pain seemed to be getting better. Patient states though that sometimes the pain does not seem to come to the back but more in the gluteal region and still though radiate down his left leg. Denies bowel or bladder problems.  Patient has been taking his muscle relaxer and only hydrocodone as needed and seems to be doing very well with the pain. Patient was also given Mobic which did seem to help some.   2.  Hx of prostate cancer,no signs of any problems doing well continue to follow up every 6 months with urologist.   3.  Depression-  patient is on no medications at this time and was doing very well until he did have a setback. Patient though still would like to stay off any medications and would like to continue to monitor naturally.   4. patient has a history of fasting hyperglycemia would like an A1c if possible.  5. palpitations patient has had a history of having palpitations in the past and does have a little bit of mitral regurgitation seen on echocardiogram 6 years ago. Patient was previously seen by Dr. Ellyn Hack at that time and has not followed up since. Patient has notice his life he works out on a regular basis he seems to have minimal to none of these arrhythmias but since he had not been working out  as religiously as he usually did he did have some more palpitations than usual. Patient may return to see Dr. Ellyn Hack in the near future.   Review of Systems Past medical history, social, surgical and family history all reviewed.  Denies fever, chills, nausea vomiting abdominal pain, dysuria, chest pain, shortness of breath dyspnea on exertion or numbness in extremities     Objective:   Physical Exam Gen: NAD CV: RRR no murmur no PVC Pul: CTAB Abd: SOft NT, ND Ext: DTR intact, strength 5/5 in both legs. neurovascularly intact  No clonus. OMT Findings: Cervical:C4 rotated and side bent right ThoracicT 5 rotated and side bent left Lumbar: L2-L4 neutral rotated right and side bent left Sacrum: Neutral        Assessment & Plan:

## 2011-06-25 NOTE — Assessment & Plan Note (Signed)
Patient has history of having premature beats previously. At this time is regular rate and rhythm patient has been seen by Dr. Eden Emms and encouraged to see him again for possibly further evaluation. At this time does not seem to be affecting his daily activities at all and does not have many risk factors.Edward Mccall

## 2011-06-25 NOTE — Patient Instructions (Addendum)
It is great to see you. I when she to try to do some of those exercises trying to increase the range of motion. When sitting aware a tennis ball in the back of her left pocket it will not feel good at first but should continue to help. Try stopping the Mobic and see if you can monitor your blood pressure. It probably would be a good idea for [place] to check in 2-3 months. Decided he would like to see Dr. Ellyn Hack again and if so I will put a referral in. I'm drawing labs today and I will call you with the results  Piriformis Syndrome  with Rehab  Piriformis syndrome is a condition the affects the nervous system in the area of the hip, and is characterized by pain and possibly a loss of feeling in the backside (posterior) thigh that may extend down the entire length of the leg. The symptoms are caused by an increase in pressure on the sciatic nerve by the piriformis muscle, which is on the back of the hip and is responsible for externally rotating the hip. The sciatic nerve and its branches connect to much of the leg. Normally the sciatic nerve runs between the piriformis muscle and other muscles. However, in certain individuals the nerve runs through the muscle, which causes an increase in pressure on the nerve and results in the symptoms of piriformis syndrome.  SYMPTOMS  Pain, tingling, numbness, or burning in the back of the thigh that may also extend down the entire leg.  Occasionally, tenderness in the buttock.  Loss of function of the leg.  Pain that worsens when using the piriformis muscle (running, jumping, or stairs).  Pain that increases with prolonged sitting.  Pain that is lessened by laying flat on the back.  CAUSES  Piriformis syndrome is the result of an increase in pressure placed on the sciatic nerve. Often times piriformis syndrome is an overuse injury.  Stress placed on the nerve from a sudden increase in the intensity, frequency, or duration of training.  Compensation of other  extremity injuries.  RISK INCREASES WITH:  Sports that involve the piriformis muscle (running, walking or jumping).  You are born with (congenital) a defect in which the sciatic nerve passes through the muscle.  PREVENTION  Warm up and stretch properly before activity.  Allow for adequate recovery between workouts.  Maintain physical fitness:  Strength, flexibility, and endurance.  Cardiovascular fitness.  PROGNOSIS  If treated properly, then the symptoms of piriformis syndrome usually resolve in 2 to 6 weeks.  RELATED COMPLICATIONS  Persistent and possibly permanent pain and numbness in the lower extremity.  Weakness of the extremity that may progress to disability and inability to compete.  TREATMENT  The most effective treatment for piriformis syndrome is rest from any activities that aggravate the symptoms. Ice and pain medication may help reduce pain and inflammation. The use of strengthening and stretching exercises may help reduce pain with activity. These exercises may be performed at home or with a therapist. A referral to a therapist may be given for further evaluation and treatment, such as ultrasound. Corticosteroid injections may be given to reduce inflammation that is causing pressure to be placed on the sciatic nerve. If non-surgical (conservative) treatment is unsuccessful, then surgery may be recommended.  MEDICATION  If pain medication is necessary, then nonsteroidal anti-inflammatory medications, such as aspirin and ibuprofen, or other minor pain relievers, such as acetaminophen, are often recommended.  Do not take pain medication for 7  days before surgery.  Prescription pain relievers may be given if deemed necessary by your caregiver. Use only as directed and only as much as you need.  Corticosteroid injections may be given by your caregiver. These injections should be reserved for the most serious cases, because they may only be given a certain number of times.  HEAT AND  COLD:  Cold treatment (icing) relieves pain and reduces inflammation. Cold treatment should be applied for 10 to 15 minutes every 2 to 3 hours for inflammation and pain and immediately after any activity that aggravates your symptoms. Use ice packs or massage the area with a piece of ice (ice massage).  Heat treatment may be used prior to performing the stretching and strengthening activities prescribed by your caregiver, physical therapist, or athletic trainer. Use a heat pack or soak the injury in warm water.  SEEK IMMEDIATE MEDICAL CARE IF:  Treatment seems to offer no benefit, or the condition worsens.  Any medications produce adverse side effects.  EXERCISES  RANGE OF MOTION (ROM) AND STRETCHING EXERCISES - Piriformis Syndrome  These exercises may help you when beginning to rehabilitate your injury. Your symptoms may resolve with or without further involvement from your physician, physical therapist or athletic trainer. While completing these exercises, remember:  Restoring tissue flexibility helps normal motion to return to the joints. This allows healthier, less painful movement and activity.  An effective stretch should be held for at least 30 seconds.  A stretch should never be painful. You should only feel a gentle lengthening or release in the stretched tissue.  STRETCH - Hip Rotators  Lie on your back on a firm surface. Grasp your right / left knee with your right / left hand and your ankle with your opposite hand.  Keeping your hips and shoulders firmly planted, gently pull your right / left knee and rotate your lower leg toward your opposite shoulder until you feel a stretch in your buttocks.  Hold this stretch for __________ seconds.  Repeat this stretch __________ times. Complete this stretch __________ times per day.  STRETCH - Iliotibial Band  On the floor or bed, lie on your side so your right / left leg is on top. Bend your knee and grab your ankle.  Slowly bring your knee back  so that your thigh is in line with your trunk. Keep your heel at your buttocks and gently arch your back so your head, shoulders and hips line up.  Slowly lower your leg so that your knee approaches the floor/bed until you feel a gentle stretch on the outside of your right / left thigh. If you do not feel a stretch and your knee will not fall farther, place the heel of your opposite foot on top of your knee and pull your thigh down farther.  Hold this stretch for __________ seconds.  Repeat __________ times. Complete __________ times per day.  STRENGTHENING EXERCISES - Piriformis Syndrome  These are some of the caregiver again or until your symptoms are resolved. Remember:  Strong muscles with good endurance tolerate stress better.  Do the exercises as initially prescribed by your caregiver. Progress slowly with each exercise, gradually increasing the number of repetitions and weight used under their guidance.  STRENGTH - Hip Abductors, Straight Leg Raises  Be aware of your form throughout the entire exercise so that you exercise the correct muscles. Sloppy form means that you are not strengthening the correct muscles.  Lie on your side so that your head, shoulders,  knee and hip line up. You may bend your lower knee to help maintain your balance. Your right / left leg should be on top.  Roll your hips slightly forward, so that your hips are stacked directly over each other and your right / left knee is facing forward.  Lift your top leg up 4-6 inches, leading with your heel. Be sure that your foot does not drift forward or that your knee does not roll toward the ceiling.  Hold this position for __________ seconds. You should feel the muscles in your outer hip lifting (you may not notice this until your leg begins to tire).  Slowly lower your leg to the starting position. Allow the muscles to fully relax before beginning the next repetition.  Repeat __________ times. Complete this exercise __________  times per day.  STRENGTH - Hip Abductors, Quadriped  On a firm, lightly padded surface, position yourself on your hands and knees. Your hands should be directly below your shoulders and your knees should be directly below your hips.  Keeping your right / left knee bent, lift your leg out to the side. Keep your legs level and in line with your shoulders.  Position yourself on your hands and knees.  Hold for __________ seconds.  Keeping your trunk steady and your hips level, slowly lower your leg to the starting position.  Repeat __________ times. Complete this exercise __________ times per day.  STRENGTH - Hip Abductors, Standing  Tie one end of a rubber exercise band/tubing to a secure surface (table, pole) and tie a loop at the other end.  Place the loop around your right / left ankle. Keeping your ankle with the band directly opposite of the secured end, step away until there is tension in the tube/band.  Hold onto a chair as needed for balance.  Keeping your back upright, your shoulders over your hips, and your toes pointing forward, lift your right / left leg out to your side. Be sure to lift your leg with your hip muscles. Do not "throw" your leg or tip your body to lift your leg.  Slowly and with control, return to the starting position.

## 2011-07-16 ENCOUNTER — Other Ambulatory Visit: Payer: Self-pay | Admitting: Family Medicine

## 2011-07-16 MED ORDER — PREDNISONE 50 MG PO TABS: ORAL_TABLET | ORAL | Status: AC

## 2011-07-18 ENCOUNTER — Encounter: Payer: Self-pay | Admitting: Family Medicine

## 2011-07-18 ENCOUNTER — Ambulatory Visit (INDEPENDENT_AMBULATORY_CARE_PROVIDER_SITE_OTHER): Payer: Medicare Other | Admitting: Family Medicine

## 2011-07-18 ENCOUNTER — Ambulatory Visit
Admission: RE | Admit: 2011-07-18 | Discharge: 2011-07-18 | Disposition: A | Discharge status: Home / Self Care | Payer: Medicare Other | Source: Ambulatory Visit | Attending: Family Medicine | Admitting: Family Medicine

## 2011-07-18 VITALS — BP 179/95 | HR 88 | Temp 98.0°F | Ht 69.5 in

## 2011-07-18 DIAGNOSIS — M4800 Spinal stenosis, site unspecified: Secondary | ICD-10-CM

## 2011-07-18 MED ORDER — DIAZEPAM 5 MG PO TABS: 5.0000 mg | ORAL_TABLET | Freq: Two times a day (BID) | ORAL | Status: AC | PRN

## 2011-07-18 MED ORDER — DEXAMETHASONE SODIUM PHOSPHATE 10 MG/ML IJ SOLN
10.0000 mg | Freq: Once | INTRAMUSCULAR | Status: AC
  Administered 2011-07-18: 10 mg via INTRAMUSCULAR

## 2011-07-18 MED ORDER — KETOROLAC TROMETHAMINE 60 MG/2ML IM SOLN
60.0000 mg | Freq: Once | INTRAMUSCULAR | Status: AC
  Administered 2011-07-18: 60 mg via INTRAMUSCULAR

## 2011-07-18 MED ORDER — OXYCODONE-ACETAMINOPHEN 10-325 MG PO TABS: 1.0000 | ORAL_TABLET | ORAL | Status: DC | PRN

## 2011-07-18 NOTE — Patient Instructions (Addendum)
It is good to see you both. I'm giving you a couple injections today to help with the pain. I when she to stop the Vicodin I'm going to give you some Percocet to see if this helps more. I want you to take Valium 5 mg twice daily for at least the next 3 days. I'm going to see if I can do to overtake [facility] for a second opinion.  This would be with Dr. Yevette Edwards if possible. I am going to get some imaging of your back done and I will call you with the results. I probably should see you back in 10-14 days to make sure you're doing better.

## 2011-07-18 NOTE — Progress Notes (Signed)
  Subjective:    Patient ID: Edward Mccall, male    DOB: 1946/05/16, 65 y.o.   MRN: [id number]  HPI  65 year old gentleman last seen for manipulation in the past. Patient has a significant history of spinal stenosis. Patient over the course of the last year has had 2 microdiscectomy use of L4-L5 L5-S1 for bulging discs by Dr. Danielle Dess over the course of a year. Patient after her second surgery seemed to be doing much better for a while there. Patient had started doing his activities of daily living as well as swimming and biking without much trouble. Patient was even able to go on approximately 100 miles a biking in a week. Patient had a goal of getting up to 150 miles a biking in a week's time. Patient though over the course of the last 4 days started having significant stiffness where he was unable to even get out of bed. Patient states that this has continued to get worse where he is now needed a walker for walking. Patient denies weakness but does feel unsteady with his legs. In patient states that this pain is even more severe then after his first surgery when he had to have a revision done of the microdiscectomy. Patient is concerned that something may have slipped from the surgery. Patient has attempted to discuss this with Dr. Danielle Dess but has not received a phone call back. He is also had a friend contact [facility] but has not seen them yet. Patient is concerned and would like a second opinion. Patient called me approximately 66 days ago and I did start him on prednisone burst at that time. He is on day 2 of this medication and states some very minimal improvement. Patient denies any fever, chills, numbness or loss of strength in the legs.  Review of Systems As stated in history of present illness    Objective:   Physical Exam General: Patient is minorly distraught and seems to be minorly anxious. Patient is ambulating with a walker but able to stand without as well as stand  up from a sitting position to standing position without walker.  Back Exam: Inspection: Patient is a well healed incision over the lumbar vertebrae just left of midline. Motion: Patient is very slow with flecks of away secondary to pain and unable to extend away either. Patient did not attempt any side bending. SLR seated:    Negative                      SLR lying: Minorly positive on the left side XSLR seated:    Minorly positive on the left side                    XSLR lying: Positive Seated HS Flexibility: Patient has very tight hamstrings Palpable tenderness: Patient has significant lumbar paraspinal spasm on left side FABER: Unable to do due to pain Sensory change: Patient actually is neurovascularly intact and sensation intact bilaterally Reflex change: Patient does have 3+ DTRs bilaterally no beats of clonus  Strength patient has 5 out of 5 strength in all muscle groups bilaterally Peroneal sensation intact rectum exam deferred   Assessment & Plan:

## 2011-07-18 NOTE — Assessment & Plan Note (Addendum)
Edward Mccall is a 65 year old male with a history of spinal stenosis and has had 2 microdiscectomy use of L4-L5 L5-S1 over the course of the last year by Dr. Danielle Dess. Patient is having worsening pain of the back which is affecting all his activities of daily living. Patient has brisk 3+ reflexes of the lower extremities bilaterally but no clonus and no bowel or bladder problems. Patient's would like a second opinion and consider other modalities at [facility] orthopedics has available.  Toradol and Decadron shot today. At this time will treat acutely with Percocet and Valium. He should not be on chronic pain medications. Hopefully we can decrease him back to Vicodin as needed once this flare is done. Referral to [facility] to see Dr. Yevette Edwards for second opinion. Encourage patient to talk to Dr. Danielle Dess is well but seems this relationship will not work for patient. Patient at this moment is walking with a walker when at his baseline he is able to work out every day and do a throat biking in the area of 150 miles a week. Today's visit had 30 minutes of counseling

## 2011-07-25 ENCOUNTER — Other Ambulatory Visit: Payer: Self-pay | Admitting: Neurological Surgery

## 2011-07-25 DIAGNOSIS — M5126 Other intervertebral disc displacement, lumbar region: Secondary | ICD-10-CM

## 2011-07-29 ENCOUNTER — Ambulatory Visit
Admission: RE | Admit: 2011-07-29 | Discharge: 2011-07-29 | Disposition: A | Discharge status: Home / Self Care | Payer: Medicare Other | Source: Ambulatory Visit | Attending: Neurological Surgery | Admitting: Neurological Surgery

## 2011-07-29 DIAGNOSIS — M5126 Other intervertebral disc displacement, lumbar region: Secondary | ICD-10-CM

## 2011-07-29 MED ORDER — GADOBENATE DIMEGLUMINE 529 MG/ML IV SOLN
15.0000 mL | Freq: Once | INTRAVENOUS | Status: AC | PRN
  Administered 2011-07-29: 15 mL via INTRAVENOUS

## 2011-08-07 ENCOUNTER — Telehealth: Payer: Self-pay | Admitting: *Deleted

## 2011-08-07 ENCOUNTER — Telehealth: Payer: Self-pay | Admitting: Internal Medicine

## 2011-08-07 NOTE — Telephone Encounter (Signed)
Pt is calling back again stating that he is out of his percocet 10-325 mg and diazepam 5 mg. He has had back surgery and his wife will come p/u the Rx's. Pt is requesting a call back ASAP. Thanks.Loralee Pacas Walker

## 2011-08-07 NOTE — Telephone Encounter (Signed)
Edward Mccall is calling about his Rx for Oxycodone that has expired and he only has enough to last through the day and needs a refill.  Please give him a call.

## 2011-08-07 NOTE — Telephone Encounter (Signed)
Pt called my personal cell phone calling to ask if I had any advice regarding what to do about running out of his pain medications.  Pt is a close family friend and was appropriate throughout our conversation.  I was somewhat familiar with what had been going on with his back via social interactions only.  He gave me a brief update stating that his pain is persistent but has not changed.  He is requiring Oxycodone 10mg  tid + qhs.  He is having difficulty with any activity and coming into the doctor is difficult for him.  I informed him that I was unable to fill this per our facility policy and per my training license restrictions.  I was able to call and get him worked into to Dr. Michaelle Copas clinic tomorrow as a favor and forwarded this to Ian Malkin in a private message.

## 2011-08-07 NOTE — Telephone Encounter (Signed)
Spoke with Dr. Alvester Morin and told him that I informed pt once again that he will need to be seen by his pcp per our policy. Dr. Alvester Morin will not refill this medication for him and stated that he will need to be seen by pcp for his meds. I will forward this to Dr. Katrinka Blazing

## 2011-08-07 NOTE — Telephone Encounter (Signed)
Error

## 2011-08-07 NOTE — Telephone Encounter (Signed)
Dr. Katrinka Blazing is in [place].  Tonya told pt that our policy is for him to be seen.  Will forward to covering MD Alvester Morin)

## 2011-08-08 ENCOUNTER — Ambulatory Visit (INDEPENDENT_AMBULATORY_CARE_PROVIDER_SITE_OTHER): Payer: Medicare Other | Admitting: Family Medicine

## 2011-08-08 ENCOUNTER — Encounter: Payer: Self-pay | Admitting: Family Medicine

## 2011-08-08 VITALS — BP 146/86 | HR 85 | Temp 98.1°F | Ht 69.5 in | Wt 158.6 lb

## 2011-08-08 DIAGNOSIS — F419 Anxiety disorder, unspecified: Secondary | ICD-10-CM

## 2011-08-08 DIAGNOSIS — IMO0001 Reserved for inherently not codable concepts without codable children: Secondary | ICD-10-CM

## 2011-08-08 DIAGNOSIS — F411 Generalized anxiety disorder: Secondary | ICD-10-CM

## 2011-08-08 DIAGNOSIS — R030 Elevated blood-pressure reading, without diagnosis of hypertension: Secondary | ICD-10-CM

## 2011-08-08 DIAGNOSIS — M4800 Spinal stenosis, site unspecified: Secondary | ICD-10-CM

## 2011-08-08 MED ORDER — KETOROLAC TROMETHAMINE 60 MG/2ML IM SOLN
60.0000 mg | Freq: Once | INTRAMUSCULAR | Status: AC
  Administered 2011-08-08: 60 mg via INTRAMUSCULAR

## 2011-08-08 MED ORDER — MORPHINE SULFATE 4 MG/ML IJ SOLN
4.0000 mg | Freq: Once | INTRAMUSCULAR | Status: AC
  Administered 2011-08-08: 4 mg via INTRAMUSCULAR

## 2011-08-08 MED ORDER — OXYCODONE-ACETAMINOPHEN 10-325 MG PO TABS: 1.0000 | ORAL_TABLET | ORAL | Status: AC | PRN

## 2011-08-08 MED ORDER — VENLAFAXINE HCL ER 75 MG PO CP24: 75.0000 mg | ORAL_CAPSULE | Freq: Every day | ORAL | Status: DC

## 2011-08-08 NOTE — Telephone Encounter (Signed)
Pt called my personal cell phone.  Told him when it is a medical question needs to go through the office. Told him I will see him on the 27th and we will deal with the pain medications that day.

## 2011-08-08 NOTE — Progress Notes (Signed)
  Subjective:    Patient ID: Edward Mccall, male    DOB: 1946-02-03, 65 y.o.   MRN: [id number]  HPI 65 year old gentleman here for followup of back pain. Please see below for more detailed history.   Patient has a significant history of spinal stenosis. Patient over the course of the last year has had 2 microdiscectomy use of L4-L5 L5-S1 for bulging discs by Dr. Danielle Dess over the course of a year. Patient after her second surgery seemed to be doing much better for a while there. Patient had started doing his activities of daily living as well as swimming and biking without much trouble. Patient was even able to go on approximately 100 miles a biking in a week. Patient had a goal of getting up to 150 miles a biking in a week's time. Patient though over the course of time started having significant stiffness where he was unable to even get out of bed. Patient was seen by Dr. Danielle Dess as well as seen a second orthopedic surgeon in [facility] both have suggested that patient does have a spinal fusion. Patient is continuing to have pain and needing to walk with the walker. After last visit patient was given some Percocet for an acute episode of this pain has not gotten any better. Patient has actually increased the amount is taking and is taking the Percocet every 4 hours. Patient states it still not relieving all his pain. Patient though is at the end and feels that he needs some other changes. Patient is willing to have surgery again. Patient has a history of some depression and anxiety problems. Patient was on Effexor previously but titrate himself off multiple months ago. Patient does notice he is been having a lot more anxiety again having trouble where he is actually having crying spells and not acting like himself. Patient denies any suicidal or homicidal ideation and denies any type of depressive symptoms has a lot of anxiety.   Review of Systems Denies fever, chills, nausea, abdominal pain,  shortness of breath, or chest pain. Patient also denies any bowel or bladder incontinence.    Objective:   Physical Exam Gen: NAD CV: RRR no murmur no PVC Pul: CTAB Abd: SOft NT, ND Ext: DTR intact, strength 4/5 in left lower extremity. neurovascularly intact  No clonus. Mild foot drop on left side. Patient on straight leg test seated does have some radiating pain down the left leg in the sciatic distribution.    Assessment & Plan:  Spinal stenosis After a long discussion with patient I did tell him that my recommendation would be to follow through with spinal fusion probably as soon as possible with whatever physician he decides on. At this time we'll continue the same pain management. I am concerned with some tolerance with this medication and told him that I do not be a long time treatment option. Do to [place] having to refill this medication and in a short time frame we will make him sign a pain contract.

## 2011-08-08 NOTE — Assessment & Plan Note (Signed)
Discussed with patient again at length given multiple different pharmacological changes as well as ventral therapy. Patient has decided not to do any therapy and has decided to start his Effexor can. We'll start 75 mg and see how patient tolerates this. If necessary we will bridge with Ativan the patient declined at this time. If patient does call I would write him for Ativan and leave at the front desk.

## 2011-08-08 NOTE — Assessment & Plan Note (Signed)
Still elevated the patient is still in pain we'll continue to monitor at every office visit. If once patient has surgery and still has an elevated blood pressure we should consider treating with a diuretic but with patient's anxiety feel like this could cause more harm than good at this point.

## 2011-08-08 NOTE — Assessment & Plan Note (Addendum)
After a long discussion with patient I did tell him that my recommendation would be to follow through with spinal fusion probably as soon as possible with whatever physician he decides on. At this time we'll continue the same pain management. I am concerned with some tolerance with this medication and told him that I do not be a long time treatment option. Do to [place] having to refill this medication and in a short time frame we will make him sign a pain contract. Addition this I do think some of his pain is amplified by his anxiety. We will also treat the anxiety with Effexor in because he did have relief during that time. I will send this note also to Dr. Danielle Dess Patient also given an injection of morphine and portal today. Patient declined any other cycle of steroids orally.

## 2011-08-08 NOTE — Patient Instructions (Signed)
I am sorry you are having pain.  Call [facility] and get in for surgery , I will call him as well.  I am refilling percocet and giving you an injection.  I hope this last time I give you the percocet.  I want you to start effexor again, if you need refill call me.  Come and see me whenever.

## 2011-08-14 ENCOUNTER — Telehealth: Payer: Self-pay | Admitting: Family Medicine

## 2011-08-14 ENCOUNTER — Other Ambulatory Visit: Payer: Self-pay | Admitting: Family Medicine

## 2011-08-14 NOTE — Telephone Encounter (Signed)
Spoke with pharmacy regarding refill for Valium.  If any questions about filling please call patient.

## 2011-08-14 NOTE — Telephone Encounter (Signed)
Refill request

## 2011-08-14 NOTE — Telephone Encounter (Signed)
Done

## 2011-08-15 ENCOUNTER — Other Ambulatory Visit: Payer: Self-pay | Admitting: Family Medicine

## 2011-08-15 MED ORDER — DIAZEPAM 5 MG PO TABS: 5.0000 mg | ORAL_TABLET | Freq: Two times a day (BID) | ORAL | Status: DC | PRN

## 2011-08-15 NOTE — Telephone Encounter (Signed)
Refill request

## 2011-08-21 ENCOUNTER — Telehealth: Payer: Self-pay | Admitting: Family Medicine

## 2011-08-21 NOTE — Telephone Encounter (Signed)
Called pt back LM stating that I would continue the valium 2 times daily but would only do this for 1 more week or so.  Tod him to call back if he needs more but hoping this will last long enough.  Pt called back and told him directly.  Will refill valium again on the 13th

## 2011-08-21 NOTE — Telephone Encounter (Signed)
Pt is asking to speak with nurse about some reactions he is having to some of his meds.

## 2011-08-21 NOTE — Telephone Encounter (Signed)
Patient reports he has surgery scheduled at [facility] for 01/17.    He is calling today to report that since he started on Effexor his anxiety has not smoothed out as it did when he started it before.  He was told to start out with 37.5 mg daily for one week and then increase to 75 mg. States starting back has been  " rough " Since anxiety has not smoothed out he has continued taking 37.5 mg and has not increased as directed. Has not been taking Valium, but last night took 2.5 mg and today has taken 5 mg. This seems to help some.    He feels his anxiety comes from upcoming surgery . Advised will forward message to Dr. Katrinka Blazing. He  Is concerned also  that Valium Rx is only for 10 day supply.

## 2011-08-21 NOTE — Telephone Encounter (Signed)
Text paged Dr. Katrinka Blazing . It appears he might be post call. Called patient back to advise that message has been sent and will call him back when Dr. Katrinka Blazing has responded. Patient is agreeable with this plan. States he called doctor at [facility] and was told they would prefer him not make any changes in meds now.  Will forward to MD.

## 2011-08-22 ENCOUNTER — Telehealth: Payer: Self-pay | Admitting: Cardiovascular Disease

## 2011-08-22 NOTE — Telephone Encounter (Signed)
Pt Signed ROI, Pick up copy of Echo  08/21/11/KM

## 2011-08-26 ENCOUNTER — Other Ambulatory Visit: Payer: Self-pay | Admitting: Family Medicine

## 2011-08-26 MED ORDER — DIAZEPAM 5 MG PO TABS: 5.0000 mg | ORAL_TABLET | Freq: Two times a day (BID) | ORAL | Status: DC | PRN

## 2011-08-26 NOTE — Progress Notes (Signed)
Called in prescription in to pharmacy.  Pt surgery got postponed by a week gave another 10 days.

## 2011-09-09 ENCOUNTER — Telehealth: Payer: Self-pay | Admitting: Family Medicine

## 2011-09-09 ENCOUNTER — Other Ambulatory Visit: Payer: Self-pay | Admitting: Family Medicine

## 2011-09-09 MED ORDER — OXYCODONE HCL 5 MG PO TABS: 5.0000 mg | ORAL_TABLET | Freq: Three times a day (TID) | ORAL | Status: AC | PRN

## 2011-09-09 MED ORDER — OXYCODONE HCL 5 MG PO TABS: 5.0000 mg | ORAL_TABLET | Freq: Three times a day (TID) | ORAL | Status: DC | PRN

## 2011-09-09 NOTE — Telephone Encounter (Signed)
Pt called about back pain running out of pain meds, surgeon at [facility] will not refill until after surgery, surgery scheduled for next week.  Told him this is against policy will refill one time then will not do any more pain meds for him.  Pt is agreeable.

## 2011-09-09 NOTE — Telephone Encounter (Signed)
Please call patient back. Returned your call. 

## 2011-09-09 NOTE — Telephone Encounter (Signed)
Spoke with Dr.Smith, he will replace the previous Rx.  Pt informed.  He will have his wife bring up the old Rx, which we will shred and give him the new one.  Will inform Asha in case I am not here.  The Rx is at my desk. [place], Maryjo Rochester

## 2011-09-09 NOTE — Telephone Encounter (Signed)
Wife came by and picked up new rx and gave me old rx that I shredded.

## 2011-09-12 ENCOUNTER — Other Ambulatory Visit: Payer: Self-pay | Admitting: Family Medicine

## 2011-09-12 NOTE — Telephone Encounter (Signed)
Refill request

## 2011-09-13 HISTORY — PX: LUMBAR DISC SURGERY: SHX700

## 2011-10-29 DIAGNOSIS — M5137 Other intervertebral disc degeneration, lumbosacral region: Secondary | ICD-10-CM | POA: Insufficient documentation

## 2011-10-30 ENCOUNTER — Telehealth: Payer: Self-pay | Admitting: Cardiovascular Disease

## 2011-10-30 NOTE — Telephone Encounter (Signed)
SPOKE WITH PT RE MESSAGE , PT  PICKED UP ECHO REPORT AND NOTED  UNDER HISTORY ON ECHO REPORT  THAT PT  HAD ALCOHOL ABUSE AND HYPERCHOLESTEREMIA PT VERY UPSET AND WANTS  THIS CHANGED PT WANTS DOCUMENTATION NOTING   CHANGED IN RECORDS.PT AWARE WILL FORWARD TO DR Eden Emms FOR REVIEW .Edward Mccall

## 2011-10-30 NOTE — Telephone Encounter (Signed)
New msg: Pt calling wanting to speak with nurse/MD regarding correcting some information on pt records.   Pt stated records stated some incorrect information including:  that pt has a history of alcohol abuse That pt has taken a medication that pt know nothing about.  Pt would like this information corrected on his chart/record.   Please return pt call to dicuuss further.

## 2011-10-30 NOTE — Telephone Encounter (Signed)
I have not seen him in 2.5 years.  My note at that time indicated that we talked about his ETOH.  I did not fill out the history section of the echo report and did not read the echo.  I cannot change an echo report read by Dr Cline Cools

## 2011-10-31 NOTE — Telephone Encounter (Signed)
PT AWARE.IS UPSET WITH DOCUMENTATION  IF NEEDS FOLLOW IN FUTURE  DOES NOT THINK HE WILL RESCHEDULE WITH DR NISHAN./CY

## 2012-01-31 ENCOUNTER — Encounter: Payer: Self-pay | Admitting: Internal Medicine

## 2012-01-31 ENCOUNTER — Ambulatory Visit (INDEPENDENT_AMBULATORY_CARE_PROVIDER_SITE_OTHER): Payer: Medicare Other | Admitting: Internal Medicine

## 2012-01-31 VITALS — BP 136/90 | HR 60 | Wt 165.0 lb

## 2012-01-31 DIAGNOSIS — F419 Anxiety disorder, unspecified: Secondary | ICD-10-CM

## 2012-01-31 DIAGNOSIS — F411 Generalized anxiety disorder: Secondary | ICD-10-CM

## 2012-01-31 MED ORDER — FLUOXETINE HCL 20 MG PO TABS: 20.0000 mg | ORAL_TABLET | Freq: Every day | ORAL | Status: DC

## 2012-01-31 NOTE — Progress Notes (Signed)
  Subjective:    Patient ID: Edward Mccall, male    DOB: 1945-09-27, 66 y.o.   MRN: [id number]  HPI For the last 2 weeks he has had intermittent significant anxiety and panic  phenomena without a specific trigger. He questions whether this may be fixation on possible complications of his 3 back surgeries with the last 13 months. This has been an intermittent issue in the past but is unable to control with meditation and high levels of cardiovascular exercise. He has taken Effexor on 2 occasions in the past year. He states his wife feels it may have been of benefit but he had concerns about its safety. At that time he was also taking Valium 5 mg is unsure which is of more benefit. He had some difficulty weaning off the Valium.  He denies a constellation of headache, chest pain, flushing, or diarrhea. He has had some intermittent palpitations;especially with caffeine. This is not significant by history  Both his brother and son on Prozac for anxiety. His mother was on Cymbalta and lorazepam.        Review of Systems  There's been no dramatic fatigue. He also is not having headaches, limb numbness or tingling or weakness other than minor tingling intermittently  in his low left lower extremity. This phenomena has been present  since his lumbosacral disc surgeries.       Objective:   Physical Exam Gen.: Thin but well-nourished; in no acute distress Eyes: Extraocular motion intact; no lid lag or proptosis; minimal ptosis on the left  Neck: No lymphadenopathy; thyroid normal to palpation without nodularity or enlargement Heart: Normal rhythm and slow rate without significant murmur, gallop, or extra heart sounds Lungs: Chest clear to auscultation without rales,rales, wheezes Neuro:Deep tendon reflexes are equal and within normal limits; minimal hand tremor  Skin: Warm and dry without significant lesions or rashes; no onycholysis Psych: Normally communicative and interactive; no abnormal mood  or affect clinically.  MDQ: all negative answers          Assessment & Plan:  #1 anxiety Plan: The pathophysiology of neurotransmitter deficiency was discussed along with the benefits and potential adverse effects of SSRI therapy.

## 2012-01-31 NOTE — Patient Instructions (Addendum)
Consider all the options we discussed. For extreme anxiety consider Valium 5 mg one half pill every 8-12 hours as needed only.

## 2012-02-10 ENCOUNTER — Telehealth: Payer: Self-pay | Admitting: *Deleted

## 2012-02-10 NOTE — Telephone Encounter (Signed)
Pt called back to report that anxiety has increased today with hot flashes and tingling/goose bumps on arms.

## 2012-02-10 NOTE — Telephone Encounter (Signed)
Pt called to report that he has been taking the Prozac but he still continues to have anxiety attack. Pt notes that he is unsure if this med is helping or if symptoms are getting worse.Marland KitchenPlease advise

## 2012-02-10 NOTE — Telephone Encounter (Signed)
Increased anxiety or panic attacks indicate significantly low serotonin levels. Please double dose of Prozac as a trial

## 2012-02-10 NOTE — Telephone Encounter (Signed)
Discuss with patient  

## 2012-02-11 ENCOUNTER — Encounter: Payer: Self-pay | Admitting: Internal Medicine

## 2012-02-11 ENCOUNTER — Ambulatory Visit (INDEPENDENT_AMBULATORY_CARE_PROVIDER_SITE_OTHER): Payer: Medicare Other | Admitting: Internal Medicine

## 2012-02-11 ENCOUNTER — Ambulatory Visit: Payer: Medicare Other | Admitting: Sports Medicine

## 2012-02-11 VITALS — BP 126/72 | HR 54 | Wt 161.0 lb

## 2012-02-11 DIAGNOSIS — F410 Panic disorder [episodic paroxysmal anxiety] without agoraphobia: Secondary | ICD-10-CM

## 2012-02-11 MED ORDER — LORAZEPAM 1 MG PO TABS: ORAL_TABLET | ORAL | Status: DC

## 2012-02-11 MED ORDER — FLUOXETINE HCL 20 MG PO CAPS: ORAL_CAPSULE | ORAL | Status: DC

## 2012-02-11 NOTE — Addendum Note (Signed)
Addended byMarga Melnick F on: 02/11/2012 10:43 AM   Modules accepted: Orders

## 2012-02-11 NOTE — Patient Instructions (Addendum)
Share report with  Psychologist

## 2012-02-11 NOTE — Progress Notes (Signed)
  Subjective:    Patient ID: Edward Mccall, male    DOB: May 12, 1946, 66 y.o.   MRN: [id number]  HPI He has had increased anxiety, particularly in the mornings. He did not feel that fluoxetine 20 mg was of benefit and he may have been worse. His symptoms improved in the evening especially a few beers. Valium 5 mg one half pill and Xanax 0.25 were not of benefit.  As per phone conversation 02/10/12; fluoxetine was increased to 20 mg 2 pills this morning. He sleeps well until approximately 3 AM and then is restless  from then until he awakens about 6 AM.    Review of Systems He denies any significant headache, chest pain, flushing, and diarrhea.     Objective:   Physical Exam He's alert and oriented and interactive. The mood disorder questionnaire was repeated. He does have a positive response of her "thoughts race failure he had recurrence when her mind down". His major symptom is the intermittent sensation of being afraid. He also lacks emotional motivation for physical activity; this is new.        Assessment & Plan:  #1 anxiety and panic; mood disorder is not suggested. The fluoxetine has been of benefit for his brother and son. The dose is probably insufficient for panic and excess anxiety. Lorazepam will be prescribed one half to 1 milligram every 6-8  hours as needed only. He will assess his response to fluoxetine 40 mg daily. Referral was discussed to a psychologist; Dr. Rolm Baptise is an excellent choice.

## 2012-02-18 ENCOUNTER — Encounter: Payer: Self-pay | Admitting: Internal Medicine

## 2012-02-19 ENCOUNTER — Encounter: Payer: Self-pay | Admitting: Internal Medicine

## 2012-02-20 ENCOUNTER — Encounter: Payer: Self-pay | Admitting: Internal Medicine

## 2012-02-20 ENCOUNTER — Ambulatory Visit (INDEPENDENT_AMBULATORY_CARE_PROVIDER_SITE_OTHER): Payer: 59 | Admitting: Licensed Clinical Social Worker

## 2012-02-20 DIAGNOSIS — F411 Generalized anxiety disorder: Secondary | ICD-10-CM

## 2012-02-24 ENCOUNTER — Encounter: Payer: Self-pay | Admitting: Internal Medicine

## 2012-02-27 ENCOUNTER — Ambulatory Visit (INDEPENDENT_AMBULATORY_CARE_PROVIDER_SITE_OTHER): Payer: 59 | Admitting: Licensed Clinical Social Worker

## 2012-02-27 DIAGNOSIS — F411 Generalized anxiety disorder: Secondary | ICD-10-CM

## 2012-03-19 ENCOUNTER — Ambulatory Visit (INDEPENDENT_AMBULATORY_CARE_PROVIDER_SITE_OTHER): Payer: 59 | Admitting: Licensed Clinical Social Worker

## 2012-03-19 DIAGNOSIS — F411 Generalized anxiety disorder: Secondary | ICD-10-CM

## 2012-04-09 ENCOUNTER — Ambulatory Visit (INDEPENDENT_AMBULATORY_CARE_PROVIDER_SITE_OTHER): Payer: 59 | Admitting: Licensed Clinical Social Worker

## 2012-04-09 DIAGNOSIS — F411 Generalized anxiety disorder: Secondary | ICD-10-CM

## 2012-05-18 ENCOUNTER — Encounter: Payer: Self-pay | Admitting: Internal Medicine

## 2012-06-08 ENCOUNTER — Encounter: Payer: Self-pay | Admitting: Internal Medicine

## 2012-06-17 ENCOUNTER — Ambulatory Visit (INDEPENDENT_AMBULATORY_CARE_PROVIDER_SITE_OTHER): Payer: Medicare Other | Admitting: Sports Medicine

## 2012-06-17 ENCOUNTER — Encounter: Payer: Self-pay | Admitting: Sports Medicine

## 2012-06-17 VITALS — BP 133/77 | HR 58 | Ht 69.25 in | Wt 158.5 lb

## 2012-06-17 DIAGNOSIS — F411 Generalized anxiety disorder: Secondary | ICD-10-CM

## 2012-06-17 DIAGNOSIS — F419 Anxiety disorder, unspecified: Secondary | ICD-10-CM

## 2012-06-17 DIAGNOSIS — M4800 Spinal stenosis, site unspecified: Secondary | ICD-10-CM

## 2012-06-17 NOTE — Progress Notes (Signed)
Patient ID: Edward Mccall, male   DOB: 08/27/1945, 66 y.o.   MRN: [id number]  SUBJECTIVE:  Edward Mccall is a 66 y.o. male with relevant PMHx of two discectomies and lumbar fusion presenting for LEFT gluteus pain and mass behind his left nipple.  Pt reports gluteus pain during the previous month that has progressively gotten worse.  He is very fit and reports that he can ride his bike without any pain; last weekend he road 50 miles on Saturday, 80 miles on Sunday without any pain.  He typically rides 200 miles per week.    Pt reports gluteus pain typically comes on with prolonged standing or prolonged sitting in the upright position.  He states that this develops as pain adjacent to his LEFT sacral inferior lateral angle (ILA) and then radiates across his glut and into the superior hamstring.  In addition to biking he also lifts weights 2-3 times per week and does regular core strengthening exercises.  Denies red flag symptoms for low back pain or cauda equina.  At its worse, his prior low back pain was debilitating and caused left sided drop fit.  With regard to breast mass he first noticed mass approximately 10 days ago at which time he also felt some nipple irritation / pain.  Denies any retraction, breast changes, nipple drainage or changes in mass size, shape or contour.  It is non-tender to his palpation.  PMHx: Prostate CA Low back pain with multiple surgeries Anxiety disorder NOS GERD  PSHx: Prostatectomy 2/2 prostate cancer (now cancer free) April 2012 - hemi-discectomy of L4-L5 disc August 2012 - complete discectomy of L4-L5 disc secondary to recurrent symptoms February 2013 - L4/L5 fusion with minimally invasive surgery  Meds: Baby ASA Daily Multi-vitamin Fish Oil Capsules, dose unknown OTC Vitamin D supplement, dose unknown Prevacid prn    OBJECTIVE: Vital signs as noted above.  Well-appearing male resting comfortably in room and in no acute distress  Low back/hip  exam: - No gross abnormalities on inspection - Full AROM, PROM in bilateral hip flexion, extension, IR, ER and abduction, adduction - 5/5 strength in above movements, with exception of 4+/5 strength in hip adduction bilaterally - 5/5 strength in knee flexion/extension - 5/5 strength in ankle dorsiflexion, great toe dorsiflexion and ankle plantar flexion with manual resistance - Pt can toe walk across room without any appreciable defects - During heel walk the left foot drops slightly and is unable to maintain equal degree of dorsiflexion as compared to the right - 2/4 DTR at patellar and achilles tendon bilaterally - Neurovascularly intact distally - Negative straight leg raise bilaterally - Negative FABER bilaterally   ASSESSMENT:  1. Low back pain with radiculopathy secondary to sacral nerve compression    PLAN: Fit, well-appearing 66 year-old male with history of multiple low back surgeries and radicular symptoms ultimately resulting in fusion of L4-L5 in February 2013.  Pt with historical symptoms to suggest possible compression of S2 given dermatomal feature of pain that is better with flexion (during bike-rides) but worse with weight-bearing extension (prolonged standing, seated upright) of lower back.  Neurovascularly intact distally with some residual weakness in dorsiflexion observed on heel walk, likely 2/2 to prior nerve injury on left at that level. As noted in patient instructions, patient was given exercises to perform to help protect his lower back and balance his flexion and extension activities to help protect him from further injury, as noted in patient instructions.   Additionally, he will continue using  NSAIDs for pain relief but switch to using Alleve scheduled at OTC dose, q day or BID (if painful day).  He will begin vitamin B-6 to help with nerve healing and will consider trying tramadol in the interim.  He will follow-up in 1 month at which time we will discuss pain,  symptoms and whether or not he would like to add-on tramadol. As for breast mass, patient will be re-checked in 1 month.  He appears to have benign mass based on historical features and physical exam.  Most likely hypothesis is gynecomastia secondary to high-dose steroids from back injuries that was aggravated from tight biking shirts.  1 month follow-up  -- Kenney Houseman, MS IV

## 2012-06-17 NOTE — Patient Instructions (Addendum)
  Thank you for coming to the office today !  Please follow-up in 1 month  To help stabilize your lower back and abdominal musculature please complete the following: - 2 flexion exercises  --- (1) Pulling your knees to your chest, squeezing for 5 count, slowly lowering legs to the floor --- repeat 3 sets of 10 --- (2) Crunches --- please complete with legs bent initially to 45 degrees -- repeat 3 sets of 10 (you may place pillows under your back for support if needed)  - 2 extension exercises --- (1) Bird dog stretches - 3 sets of 10 daily --- (2) Extending your torso with hands at shoulder distance while prone -- 3 sets of 10 daily   You may do lat pull downs, rows and other activities for your low back, chest or core at the gym but please do them with firm back support (using a seat or a wall)

## 2012-06-18 NOTE — Assessment & Plan Note (Signed)
I think his sxs are coming from level below his fusion  This relates to his spinal stenosis  We will try a rehab program to balance extension and flexion  First try OTC aleve  If no response may try tramadol

## 2012-06-18 NOTE — Assessment & Plan Note (Signed)
With his anxiety disorder he has a fear of taking prescription medications  He might benefit from gabapentin and or tramadol but will hold on this for now to see if rehab works

## 2012-06-30 ENCOUNTER — Ambulatory Visit (INDEPENDENT_AMBULATORY_CARE_PROVIDER_SITE_OTHER): Payer: Medicare Other | Admitting: Internal Medicine

## 2012-06-30 VITALS — BP 126/78 | HR 74 | Temp 98.1°F | Wt 162.0 lb

## 2012-06-30 DIAGNOSIS — N62 Hypertrophy of breast: Secondary | ICD-10-CM

## 2012-06-30 DIAGNOSIS — Z8546 Personal history of malignant neoplasm of prostate: Secondary | ICD-10-CM

## 2012-06-30 DIAGNOSIS — Z23 Encounter for immunization: Secondary | ICD-10-CM

## 2012-06-30 NOTE — Progress Notes (Signed)
  Subjective:    Patient ID: Edward Mccall, male    DOB: [birth year], 66 y.o.   MRN: [id number]  HPI  He describes intermittent diffuse tingling especially in the morning upon awakening which resolves as he ambulates. He also questions whether he is having some flushing on rare occasion. There is no associated headache, chest pain, or diarrhea with the flushing.   He describes mood swings as emotional lability such as responding to the content of about his reading.  He questions whether there might be "low T".  For the last month he's noted a knot in the left breast. This is in the area where the bib shorts he wears biking cause some abrasion. He is not on spironolactone & denies excess caffeine  Review of Systems He describes decreased libido since back & prostate surgery ; he denies loss of strength. He bikes up to 130 miles per week.     Objective:   Physical Exam  Gen.: Thin but well-nourished; in no acute distress Eyes: Extraocular motion intact; no lid lag or proptosis Neck: thyroid normal Heart: Normal rhythm and rate without  gallop, or extra heart sounds.Grade 1/6 systolic murmur  Lungs: Chest clear to auscultation without rales,rales, wheezes Tender subareolar fibrocystic changes in the left Neuro:Deep tendon reflexes are equal and within normal limits; no tremor  Skin: Warm and dry without significant lesions or rashes; no onycholysis Genitalia normal except for left varices  No neck ,axillary , or inguinal nodes Psych: Normally communicative and interactive; no abnormal mood or affect clinically.         Assessment & Plan:  #1 paresthesias, self-limited  #2 rare flushing  #3 fibrocystic breast changes on the left  #4 decreased libido in the setting of back surgery and prostate surgery

## 2012-06-30 NOTE — Patient Instructions (Addendum)
Please  schedule fasting Labs : BMET, hepatic panel,  TSH, testosterone,serum estradiol,LH,FSH,prolactin.PLEASE BRING THESE INSTRUCTIONS TO FOLLOW UP  LAB APPOINTMENT.This will guarantee correct labs are drawn, eliminating need for repeat blood sampling ( needle sticks ! ). Diagnoses /Codes: 611.1

## 2012-07-02 ENCOUNTER — Other Ambulatory Visit (INDEPENDENT_AMBULATORY_CARE_PROVIDER_SITE_OTHER): Payer: Medicare Other

## 2012-07-02 DIAGNOSIS — N62 Hypertrophy of breast: Secondary | ICD-10-CM

## 2012-07-02 DIAGNOSIS — Z79899 Other long term (current) drug therapy: Secondary | ICD-10-CM

## 2012-07-02 LAB — BASIC METABOLIC PANEL
CO2: 31 mEq/L (ref 19–32)
Calcium: 9.5 mg/dL (ref 8.4–10.5)
Glucose, Bld: 112 mg/dL — ABNORMAL HIGH (ref 70–99)
Potassium: 3.8 mEq/L (ref 3.5–5.1)
Sodium: 137 mEq/L (ref 135–145)

## 2012-07-02 LAB — HEPATIC FUNCTION PANEL
AST: 20 U/L (ref 0–37)
Albumin: 4.1 g/dL (ref 3.5–5.2)
Alkaline Phosphatase: 49 U/L (ref 39–117)
Total Protein: 6.9 g/dL (ref 6.0–8.3)

## 2012-07-02 LAB — LUTEINIZING HORMONE: LH: 5.59 m[IU]/mL (ref 1.50–9.30)

## 2012-07-03 ENCOUNTER — Encounter: Payer: Self-pay | Admitting: Internal Medicine

## 2012-07-04 LAB — PROLACTIN: Prolactin: 3.9 ng/mL (ref 2.1–17.1)

## 2012-07-05 ENCOUNTER — Other Ambulatory Visit: Payer: Self-pay | Admitting: Internal Medicine

## 2012-07-05 ENCOUNTER — Encounter: Payer: Self-pay | Admitting: Internal Medicine

## 2012-07-05 DIAGNOSIS — R7989 Other specified abnormal findings of blood chemistry: Secondary | ICD-10-CM

## 2012-07-05 DIAGNOSIS — N62 Hypertrophy of breast: Secondary | ICD-10-CM

## 2012-07-05 DIAGNOSIS — Z8546 Personal history of malignant neoplasm of prostate: Secondary | ICD-10-CM

## 2012-07-07 NOTE — Addendum Note (Signed)
Addended by: Maurice Small on: 07/07/2012 11:32 AM   Modules accepted: Orders

## 2012-07-08 LAB — ESTRADIOL, FREE

## 2012-07-21 ENCOUNTER — Ambulatory Visit: Payer: Self-pay | Admitting: Sports Medicine

## 2012-07-22 ENCOUNTER — Ambulatory Visit (INDEPENDENT_AMBULATORY_CARE_PROVIDER_SITE_OTHER): Payer: Medicare Other | Admitting: Endocrinology

## 2012-07-22 ENCOUNTER — Encounter: Payer: Self-pay | Admitting: Endocrinology

## 2012-07-22 VITALS — BP 134/80 | HR 60 | Temp 98.0°F | Wt 160.0 lb

## 2012-07-22 DIAGNOSIS — N62 Hypertrophy of breast: Secondary | ICD-10-CM

## 2012-07-22 HISTORY — DX: Hypertrophy of breast: N62

## 2012-07-22 NOTE — Patient Instructions (Addendum)
A mammogram is requested for you today.  We'll contact you with results. If the mammogram is ok, i would be happy to prescribe for you a hormonal medication, but this is optional.

## 2012-07-22 NOTE — Progress Notes (Signed)
Subjective:    Patient ID: Edward Mccall, male    DOB: 1945/12/23, 66 y.o.   MRN: [id number]  HPI Pt states few years of slight swelling at the left breast area, and slight assoc pain. Past Medical History  Diagnosis Date  . 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 MITRAL REGURGITATION 11/15/2008  . GERD 05/24/2008  . Anxiety 01/02/2011  . Spinal stenosis 01/02/2011    Past Surgical History  Procedure Date  . Lumbar disc surgery 11/2010    L4-5 discectomy & laminectomy, Dr Danielle Dess   . Lumbar disc surgery 01/2011    L4-5 discectomy,Dr Elsner  . Lumbar disc surgery 09/2011    L4-5 minimally invasive fusion , Dr Penne Lash, [facility]    History   Social History  . Marital Status: Married    Spouse Name: N/A    Number of Children: N/A  . Years of Education: N/A   Occupational History  . Not on file.   Social History Main Topics  . Smoking status: Former Smoker    Quit date: 08/12/1973  . Smokeless tobacco: Not on file     Comment: Quit x 40 yrs ago   . Alcohol Use: Yes     Comment: daily 2-3/night  . Drug Use: No  . Sexually Active: Not on file   Other Topics Concern  . Not on file   Social History Narrative  . No narrative on file    Current Outpatient Prescriptions on File Prior to Visit  Medication Sig Dispense Refill  . aspirin 81 MG tablet Take 81 mg by mouth daily.        Marland Kitchen escitalopram (LEXAPRO) 5 MG tablet Take 5 mg by mouth daily.      . lansoprazole (PREVACID) 15 MG capsule Take 15 mg by mouth. 1 by mouth 3 x weekly       . LORazepam (ATIVAN) 1 MG tablet 1/2 - 1 every 6-8 hrs prn only  30 tablet  1  . Multiple Vitamin (MULTIVITAMIN) capsule Take 1 capsule by mouth daily.        . Omega-3 Fatty Acids (FISH OIL) 1000 MG CAPS Take 1,000 mg by mouth daily.          No Known Allergies  Family History  Problem Relation Age of Onset  . Anxiety disorder Mother   . Anxiety disorder Son   . Anxiety disorder Brother   . Alcohol abuse Father     BP 134/80  Pulse 60  Temp  98 F (36.7 C) (Oral)  Wt 160 lb (72.576 kg)  SpO2 98%    Review of Systems denies polyuria, syncope, headache, visual loss, galactorrhea, weight change, easy bruising, change in facial appearance, rhinorrhea, and n/v.  He reports mood swings.  He has chronic low-back pain, rhinorrhea, and anxiety.  He has intermittent facial flushing.      Objective:   Physical Exam VS: see vs page GEN: no distress HEAD: head: no deformity eyes: no periorbital swelling, no proptosis external nose and ears are normal mouth: no lesion seen NECK: supple, thyroid is not enlarged CHEST WALL: no deformity LUNGS: clear to auscultation BREASTS:  2 cm left gynecomastia, above the areola.  No drainage CV: reg rate and rhythm, no murmur ABD: abdomen is soft, nontender.  no hepatosplenomegaly.  not distended.  no hernia GENITALIA:  Normal male.   MUSCULOSKELETAL: muscle bulk and strength are grossly normal.  no obvious joint swelling.  gait is normal and steady. EXTEMITIES: no deformity.  no  ulcer on the feet.  feet are of normal color and temp.  no edema PULSES: dorsalis pedis intact bilat.  no carotid bruit NEURO:  cn 2-12 grossly intact.   readily moves all 4's.  sensation is intact to touch on the feet SKIN:  Normal texture and temperature.  No rash or suspicious lesion is visible.   NODES:  None palpable at the neck PSYCH: alert, oriented x3.  Does not appear anxious nor depressed.    (i reviewed labs)    Assessment & Plan:  Gynecomastia, new to me, uncertain etiology Mild hypogonadism, primary.  No karyotype needed at this age. H/o prostate cancer.  Despite good result from surgery, this precludes testosterone rx

## 2012-07-31 ENCOUNTER — Other Ambulatory Visit: Payer: Self-pay | Admitting: Endocrinology

## 2012-07-31 ENCOUNTER — Ambulatory Visit
Admission: RE | Admit: 2012-07-31 | Discharge: 2012-07-31 | Disposition: A | Discharge status: Home / Self Care | Payer: Medicare Other | Source: Ambulatory Visit | Attending: Endocrinology | Admitting: Endocrinology

## 2012-07-31 DIAGNOSIS — N62 Hypertrophy of breast: Secondary | ICD-10-CM

## 2012-08-10 ENCOUNTER — Telehealth: Payer: Self-pay | Admitting: Internal Medicine

## 2012-08-10 NOTE — Telephone Encounter (Signed)
Pt needs someone to call him about getting a back referral to see someone.

## 2012-08-11 ENCOUNTER — Other Ambulatory Visit: Payer: Self-pay | Admitting: Internal Medicine

## 2012-08-11 DIAGNOSIS — G8929 Other chronic pain: Secondary | ICD-10-CM

## 2012-08-11 DIAGNOSIS — M549 Dorsalgia, unspecified: Secondary | ICD-10-CM

## 2012-08-11 NOTE — Telephone Encounter (Signed)
Spoke with patient, patient with 3 back surgeries with in the last year or so. Patient needs referral to [facility], patient with constant back pain. Patient would like consult with [facility] to see if they would be able to help alleviate pain   FYI: Last Surgery was performed by Dr. Carolyn Stare  @ [facility] [phone]  Hopp please advise

## 2012-09-01 ENCOUNTER — Encounter: Payer: Self-pay | Admitting: Internal Medicine

## 2012-09-08 ENCOUNTER — Ambulatory Visit: Payer: Medicare Other | Attending: Internal Medicine

## 2012-09-08 DIAGNOSIS — R5381 Other malaise: Secondary | ICD-10-CM | POA: Insufficient documentation

## 2012-09-08 DIAGNOSIS — IMO0001 Reserved for inherently not codable concepts without codable children: Secondary | ICD-10-CM | POA: Insufficient documentation

## 2012-09-08 DIAGNOSIS — R293 Abnormal posture: Secondary | ICD-10-CM | POA: Insufficient documentation

## 2012-09-08 DIAGNOSIS — M2550 Pain in unspecified joint: Secondary | ICD-10-CM | POA: Insufficient documentation

## 2012-09-10 ENCOUNTER — Ambulatory Visit (INDEPENDENT_AMBULATORY_CARE_PROVIDER_SITE_OTHER): Payer: Medicare Other | Admitting: Cardiovascular Disease

## 2012-09-10 ENCOUNTER — Encounter: Payer: Self-pay | Admitting: Cardiovascular Disease

## 2012-09-10 ENCOUNTER — Ambulatory Visit: Payer: Medicare Other

## 2012-09-10 ENCOUNTER — Ambulatory Visit (HOSPITAL_COMMUNITY): Payer: Medicare Other | Attending: Internal Medicine | Admitting: Radiology

## 2012-09-10 VITALS — BP 149/85 | HR 51 | Ht 69.0 in | Wt 157.0 lb

## 2012-09-10 DIAGNOSIS — I059 Rheumatic mitral valve disease, unspecified: Secondary | ICD-10-CM

## 2012-09-10 DIAGNOSIS — I340 Nonrheumatic mitral (valve) insufficiency: Secondary | ICD-10-CM

## 2012-09-10 DIAGNOSIS — Z87891 Personal history of nicotine dependence: Secondary | ICD-10-CM | POA: Insufficient documentation

## 2012-09-10 DIAGNOSIS — I498 Other specified cardiac arrhythmias: Secondary | ICD-10-CM | POA: Insufficient documentation

## 2012-09-10 DIAGNOSIS — Z8546 Personal history of malignant neoplasm of prostate: Secondary | ICD-10-CM | POA: Insufficient documentation

## 2012-09-10 NOTE — Patient Instructions (Signed)
Your physician wants you to follow-up in:  12 months. You will receive a reminder letter in the mail two months in advance. If you don't receive a letter, please call our office to schedule the follow-up appointment.  Your physician has requested that you have an echocardiogram. Echocardiography is a painless test that uses sound waves to create images of your heart. It provides your doctor with information about the size and shape of your heart and how well your heart's chambers and valves are working. This procedure takes approximately one hour. There are no restrictions for this procedure. Today at 2:00

## 2012-09-10 NOTE — Progress Notes (Signed)
Echocardiogram performed.  

## 2012-09-10 NOTE — Progress Notes (Signed)
History of Present Illness: 67 yo male with history of mitral regurgitation, anxiety and GERD who has been followed by Dr. Eden Emms in the past who is here today to establish in my clinic. I have done an extensive review of the records and find that he has had an echo and stress myoview in the past. He has a history of non-specific ECG changes but no structural heart disease by echo or myoview. Echo 11/08/08 with normal LV size and function, mild to moderate MR, mild LAE. Per report, stress myoview without ischemia.   He tells me today that he has been doing well. He has had some palpitations when he is anxious. No chest pain or SOB. He is very active and exercises daily.   Primary Care Physician: Alwyn Ren  Last Lipid Profile:Lipid Panel     Component Value Date/Time   CHOL 223* 06/25/2011 0938   TRIG 78 06/25/2011 0938   HDL 83 06/25/2011 0938   CHOLHDL 2.7 06/25/2011 0938   VLDL 16 06/25/2011 0938   LDLCALC 124* 06/25/2011 0938     Past Medical History  Diagnosis Date  . 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 MITRAL REGURGITATION 11/15/2008    Mild to moderate by echo 2010  . GERD 05/24/2008  . Anxiety 01/02/2011  . Spinal stenosis 01/02/2011  . Prostate cancer   . Gynecomastia     Past Surgical History  Procedure Date  . Lumbar disc surgery 11/2010    L4-5 discectomy & laminectomy, Dr Danielle Dess   . Lumbar disc surgery 01/2011    L4-5 discectomy,Dr Elsner  . Lumbar disc surgery 09/2011    L4-5 minimally invasive fusion , Dr Penne Lash, [facility]  . Prostatectomy 2011  . Tooth extraction     Current Outpatient Prescriptions  Medication Sig Dispense Refill  . aspirin 81 MG tablet Take 81 mg by mouth daily.        . Garlic 100 MG TABS Take by mouth. 1 TAB TWICE A DAY      . lansoprazole (PREVACID) 15 MG capsule Take 15 mg by mouth. 1 by mouth 3 x weekly       . LORazepam (ATIVAN) 1 MG tablet TAKE . 5MG  TWICE A DAY      . Multiple Vitamin (MULTIVITAMIN) capsule Take 1 capsule by mouth daily.         . naproxen sodium (ANAPROX) 220 MG tablet Take 220 mg by mouth 2 (two) times daily with a meal.      . Omega-3 Fatty Acids (FISH OIL) 1000 MG CAPS Take 1,000 mg by mouth daily.          No Known Allergies  History   Social History  . Marital Status: Married    Spouse Name: N/A    Number of Children: 2  . Years of Education: N/A   Occupational History  . Retired-works part time at [employer]    Social History Main Topics  . Smoking status: Former Smoker -- 1.0 packs/day for 10 years    Types: Cigarettes    Quit date: 08/12/1973  . Smokeless tobacco: Not on file     Comment: Quit x 40 yrs ago   . Alcohol Use: 3.5 oz/week    7 drink(s) per week     Comment: daily 2-3/night  . Drug Use: No  . Sexually Active: Not on file   Other Topics Concern  . Not on file   Social History Narrative  . No narrative on file  Family History  Problem Relation Age of Onset  . Anxiety disorder Mother   . Anxiety disorder Son   . Anxiety disorder Brother   . Alcohol abuse Father   . CAD Neg Hx     Review of Systems:  As stated in the HPI and otherwise negative.   BP 149/85  Pulse 51  Ht 5\' 9"  (1.753 m)  Wt 157 lb (71.215 kg)  BMI 23.18 kg/m2  Physical Examination: General: Well developed, well nourished, NAD HEENT: OP clear, mucus membranes moist SKIN: warm, dry. No rashes. Neuro: No focal deficits Musculoskeletal: Muscle strength 5/5 all ext Psychiatric: Mood and affect normal Neck: No JVD, no carotid bruits, no thyromegaly, no lymphadenopathy. Lungs:Clear bilaterally, no wheezes, rhonci, crackles Cardiovascular: Regular rate and rhythm. Slight systolic murmur. No gallops or rubs. Abdomen:Soft. Bowel sounds present. Non-tender.  Extremities: No lower extremity edema. Pulses are 2 + in the bilateral DP/PT.  EKG: Sinus bradycardia, rate 51bpm. Old inferior ST depression with T wave inversions.   Echo 11/08/08:  Overall left ventricular systolic function was normal.  Left ventricular ejection fraction was estimated to be 65 %. There was no diagnostic evidence of left ventricular regional wall motion abnormalities. Left ventricular wall thickness was mildly increased. - Normal aortic valve - There is flat closure of the mitral valve. There was mild to moderate mitral valvular regurgitation. - The left atrium was mildly dilated.  Assessment and Plan:   1. Mitral regurgitation: Will update echo to assess MR. Based on his examination, I would not expect that his regurgitation has worsened. I will see him back in one year.   2. Palpitations: Resolved with use of Ativan. He will call if he has worsening of palpitations or increase in frequency.

## 2012-09-15 ENCOUNTER — Ambulatory Visit: Payer: Medicare Other | Attending: Internal Medicine

## 2012-09-15 DIAGNOSIS — R293 Abnormal posture: Secondary | ICD-10-CM | POA: Insufficient documentation

## 2012-09-15 DIAGNOSIS — IMO0001 Reserved for inherently not codable concepts without codable children: Secondary | ICD-10-CM | POA: Insufficient documentation

## 2012-09-15 DIAGNOSIS — M2550 Pain in unspecified joint: Secondary | ICD-10-CM | POA: Insufficient documentation

## 2012-09-15 DIAGNOSIS — R5381 Other malaise: Secondary | ICD-10-CM | POA: Insufficient documentation

## 2012-09-17 ENCOUNTER — Ambulatory Visit: Payer: Medicare Other

## 2012-09-29 ENCOUNTER — Ambulatory Visit: Payer: Medicare Other

## 2012-10-01 ENCOUNTER — Ambulatory Visit: Payer: Medicare Other

## 2012-11-10 ENCOUNTER — Encounter: Payer: Self-pay | Admitting: Internal Medicine

## 2012-11-10 ENCOUNTER — Encounter: Payer: Self-pay | Admitting: Lab

## 2012-11-10 ENCOUNTER — Ambulatory Visit (INDEPENDENT_AMBULATORY_CARE_PROVIDER_SITE_OTHER): Payer: Medicare Other | Admitting: Internal Medicine

## 2012-11-10 VITALS — BP 114/72 | HR 67 | Wt 152.0 lb

## 2012-11-10 DIAGNOSIS — F411 Generalized anxiety disorder: Secondary | ICD-10-CM

## 2012-11-10 DIAGNOSIS — R51 Headache: Secondary | ICD-10-CM

## 2012-11-10 DIAGNOSIS — R42 Dizziness and giddiness: Secondary | ICD-10-CM

## 2012-11-10 LAB — CBC WITH DIFFERENTIAL/PLATELET
Basophils Absolute: 0 10*3/uL (ref 0.0–0.1)
Eosinophils Absolute: 0.1 10*3/uL (ref 0.0–0.7)
Lymphocytes Relative: 20.4 % (ref 12.0–46.0)
Lymphs Abs: 1.2 10*3/uL (ref 0.7–4.0)
MCHC: 34.4 g/dL (ref 30.0–36.0)
Monocytes Relative: 6.7 % (ref 3.0–12.0)
Neutro Abs: 4.2 10*3/uL (ref 1.4–7.7)
Platelets: 242 10*3/uL (ref 150.0–400.0)
RDW: 12.8 % (ref 11.5–14.6)
WBC: 6 10*3/uL (ref 4.5–10.5)

## 2012-11-10 LAB — BASIC METABOLIC PANEL
BUN: 15 mg/dL (ref 6–23)
CO2: 29 mEq/L (ref 19–32)
Calcium: 9.7 mg/dL (ref 8.4–10.5)
Creatinine, Ser: 0.9 mg/dL (ref 0.4–1.5)
Glucose, Bld: 120 mg/dL — ABNORMAL HIGH (ref 70–99)

## 2012-11-10 LAB — TSH: TSH: 0.67 u[IU]/mL (ref 0.35–5.50)

## 2012-11-10 LAB — T3, FREE: T3, Free: 3.4 pg/mL (ref 2.3–4.2)

## 2012-11-10 NOTE — Patient Instructions (Addendum)
Review and correct the record as indicated. Please share record with all medical staff seen.  

## 2012-11-10 NOTE — Progress Notes (Signed)
  Subjective:    Patient ID: Edward Mccall, male    DOB: 02-06-1946, 67 y.o.   MRN: [id number]  HPI Over the last several months he's had intermittent lightheadedness associated with a circumferential low-grade headache; the symptoms may last one to 2 hours. This occurs every few days w/o specific trigger. He has noted the symptoms actually improve with exercise. He questions whether this is related to anxiety; has been seeing Dr. Donell Beers and is on lorazepam 0.5 mg twice a day. He has concerns about possible addiction to this medication. With this medicine his blood pressure has been much better controlled. Previously he had some blood pressure spikes.   No associated frank vertigo or pre syncope.Symptoms were not related to straining ,pain, cardiac or neurologic prodrome. No seizure activity or syncope described. No PMH of head trauma or LOC since childhood.            Review of Systems   Constitutional : No fever , chills, sweats , change in weight since surgery ENT:No sinus pressure;nasal purulence; hearing loss;earache ;otic discharge. Minor tinnitus chronically. Eye: No blurred vision , diplopia, vision loss Cardiac prodrome: Occasional irregular rhythm for years.No associated  nausea , sweating.He has seen his cardiologist; EKG revealed no acute changes. Pulmonary: No acute dyspnea, cough, hemoptysis Neurologic :No  numbness and tingling,limb  weakness, change in coordination (gait/falling).He continues to have intermittent sciatic pain for which he takes Aleve twice a day or ibuprofen as needed. Psych: Some depression ; this was discussed with Dr Donell Beers.       Objective:   Physical Exam Gen. appearance: Thin but well-nourished, in no distress Eyes: Extraocular motion intact, field of vision normal, vision grossly intact, no nystagmus ENT: Canals clear, tympanic membranes normal, tuning fork exam normal, hearing grossly normal Neck: Normal range of motion, no masses,  normal thyroid Cardiovascular: Rate and rhythm normal; no murmurs, or gallops. S4 with slurring at  LSB  Muscle skeletal: Tone &  strength normal Neuro:Oriented X3.No cranial nerve deficit, deep tendon  reflexes normal, gait including tiptoe & heel walk normal. Rhomberg & finger to nose negative.Slight hand tremor Lymph: No cervical or axillary LA Skin: Warm and dry without suspicious lesions or rashes Psych: Slightly  Anxious otherwise normally interactive and cooperative.         Assessment & Plan:  #1 paroxysmal lightheadedness #2 headache w/o neurologic deficit #3 anxiety Plan: See orders and recommendations

## 2012-11-11 ENCOUNTER — Encounter: Payer: Self-pay | Admitting: Internal Medicine

## 2012-11-12 ENCOUNTER — Ambulatory Visit: Payer: Medicare Other

## 2012-11-12 DIAGNOSIS — R7309 Other abnormal glucose: Secondary | ICD-10-CM

## 2012-11-16 LAB — CATECHOLAMINES, FRACTIONATED, URINE, 24 HOUR
Calculated Total (E+NE): 63 mcg/24 h (ref 26–121)
Creatinine, Urine mg/day-CATEUR: 1.43 g/(24.h) (ref 0.63–2.50)
Dopamine, 24 hr Urine: 165 mcg/24 h (ref 52–480)
Epinephrine, 24 hr Urine: 13 mcg/24 h (ref 2–24)
Total Volume - CF 24Hr U: 2000 mL

## 2012-11-17 LAB — METANEPHRINES, URINE, 24 HOUR: Metanephrines, Ur: 316 mcg/24 h — ABNORMAL HIGH (ref 90–315)

## 2012-11-18 ENCOUNTER — Encounter: Payer: Self-pay | Admitting: Internal Medicine

## 2012-12-05 IMAGING — CR DG LUMBAR SPINE 2-3V
1 series · 1 of 1 positions shown · non-contrast
Comparison: 11/08/2010

CLINICAL DATA: Low back pain

LUMBAR SPINE - 2-3 VIEW

[view not recorded]
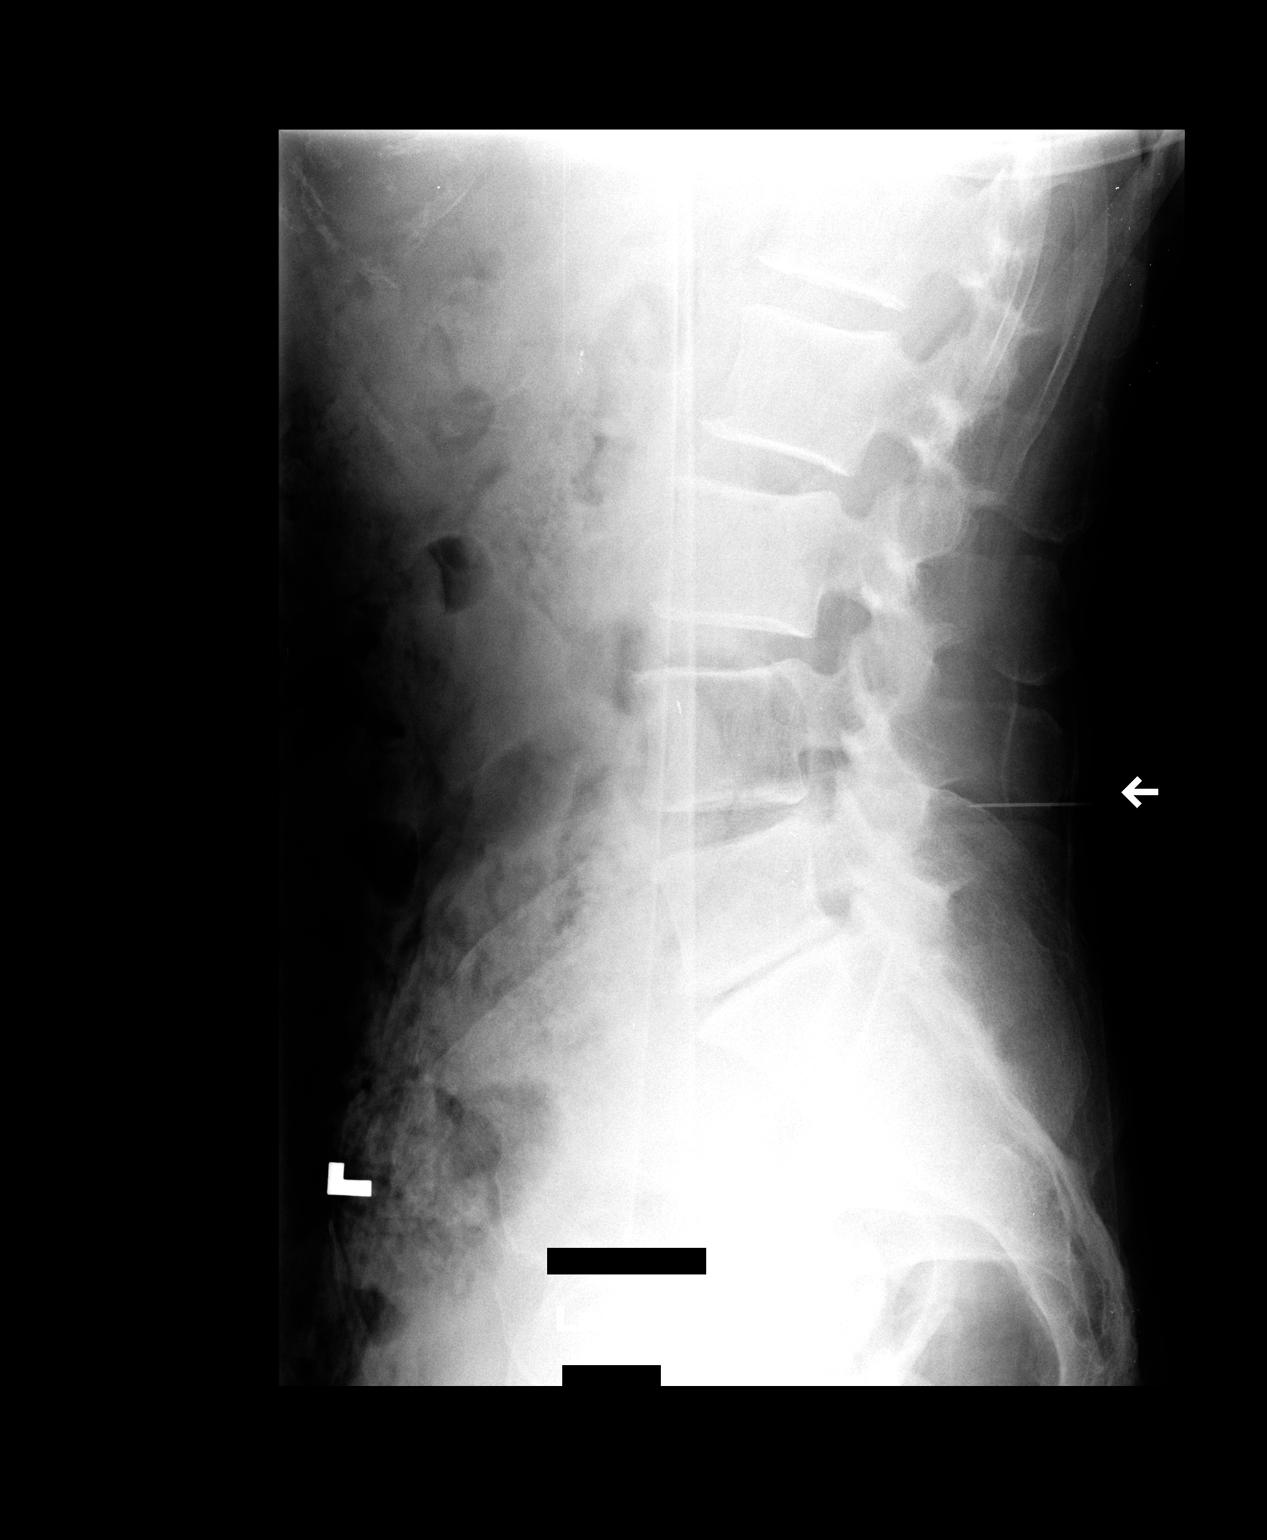

[1 of 1 positions shown; findings below may reference images not displayed]

FINDINGS: The first film, portable intraoperative lateral view at
[DATE] hours, reveals a needle directed most closely toward L4-L5.

Film #2 at [DATE] hours demonstrates a blunt angled probe directed
towards L4-L5.
IMPRESSION: As above.

## 2012-12-05 IMAGING — CR DG LUMBAR SPINE 2-3V
1 series · 1 of 1 positions shown · non-contrast
Comparison: 11/08/2010

CLINICAL DATA: Low back pain

LUMBAR SPINE - 2-3 VIEW

[view not recorded]
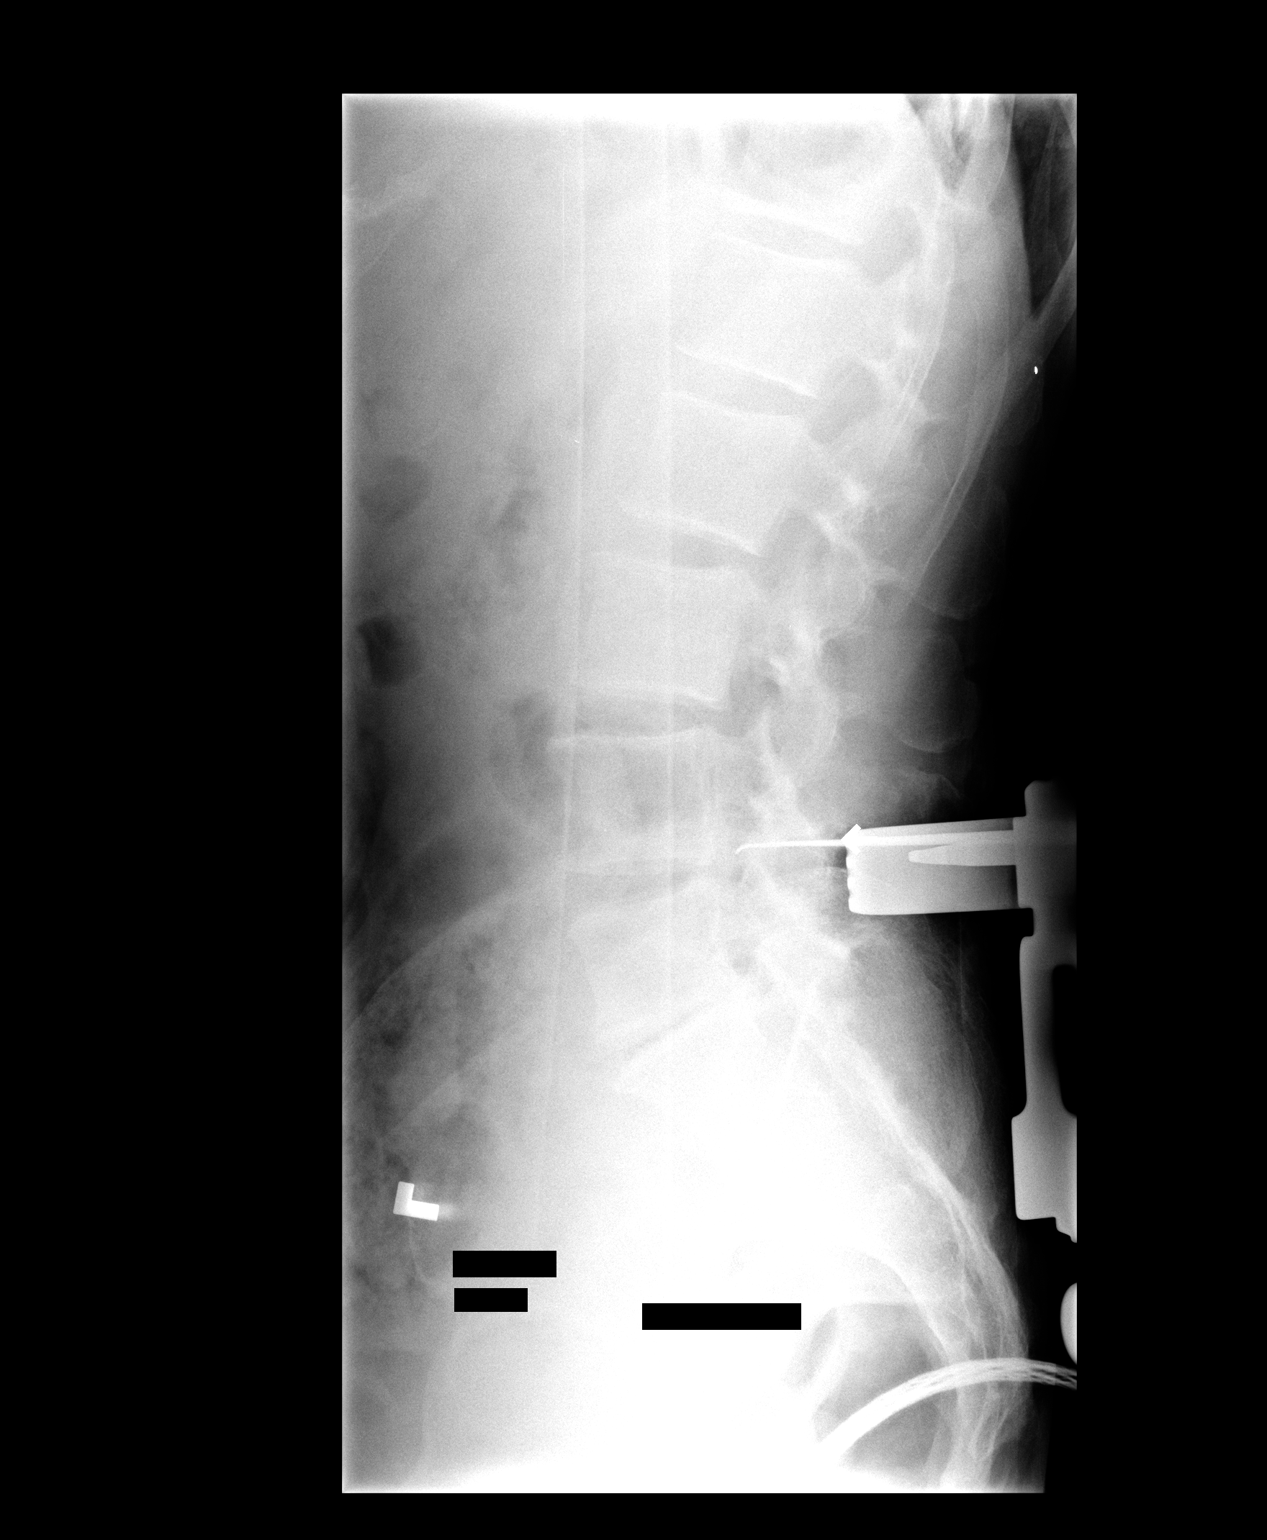

[1 of 1 positions shown; findings below may reference images not displayed]

FINDINGS: The first film, portable intraoperative lateral view at
[DATE] hours, reveals a needle directed most closely toward L4-L5.

Film #2 at [DATE] hours demonstrates a blunt angled probe directed
towards L4-L5.
IMPRESSION: As above.

## 2012-12-10 ENCOUNTER — Encounter: Payer: Self-pay | Admitting: Internal Medicine

## 2012-12-30 ENCOUNTER — Encounter: Payer: Self-pay | Admitting: Internal Medicine

## 2013-01-21 ENCOUNTER — Encounter: Payer: Self-pay | Admitting: Internal Medicine

## 2013-01-21 ENCOUNTER — Ambulatory Visit (INDEPENDENT_AMBULATORY_CARE_PROVIDER_SITE_OTHER): Payer: Medicare Other | Admitting: Internal Medicine

## 2013-01-21 VITALS — BP 118/72 | HR 57 | Wt 156.0 lb

## 2013-01-21 DIAGNOSIS — H01009 Unspecified blepharitis unspecified eye, unspecified eyelid: Secondary | ICD-10-CM

## 2013-01-21 DIAGNOSIS — H01006 Unspecified blepharitis left eye, unspecified eyelid: Secondary | ICD-10-CM

## 2013-01-21 MED ORDER — ERYTHROMYCIN 5 MG/GM OP OINT: TOPICAL_OINTMENT | OPHTHALMIC | Status: DC

## 2013-01-21 NOTE — Patient Instructions (Addendum)
Cleans the base of the eyelashes in the shower as discussed

## 2013-01-21 NOTE — Progress Notes (Signed)
  Subjective:    Patient ID: Edward Mccall, male    DOB: 1946/01/21, 67 y.o.   MRN: [id number]  HPI  For the last week he's noticed some redness and tenderness in the left eye. This has been associated with some crusting and production of tan "gunk" in the morning. He's also had some mild itching.  Through the day he has noted some scant watery discharge. Cleansing wipes used in am for the gunk.    Review of Systems  He has no frontal headache, facial pain, nasal purulence, dental pain, sore throat, otic pain, otic discharge, fever, chills, or sweats.  He denies associated blurred vision, double vision, or loss of vision.  Despite the watery discharge he denies  sneezing.     Objective:   Physical Exam General appearance:good health ;well nourished; no acute distress or increased work of breathing is present.  No  lymphadenopathy about the head, neck, or axilla noted.   Eyes: Mild conjunctival inflammation w/o lid edema of OS upper lid with erythema externally of upper lid is present. Slight ptosis OS.EOMI & vision intact.  Ears:  External ear exam shows no significant lesions or deformities.  Otoscopic examination reveals clear canals, tympanic membranes are intact bilaterally without bulging, retraction, inflammation or discharge.  Nose:  External nasal examination shows no deformity or inflammation. Nasal mucosa are pink and moist without lesions or exudates. L septal deviation.  Oral exam: Dental hygiene is good; lips and gums are healthy appearing.There is no oropharyngeal erythema or exudate noted.   Neck:  No deformities,  masses, or tenderness noted.   Supple with full range of motion without pain.      Skin: Warm & dry .         Assessment & Plan:  #1 OS blepharitis See Orders

## 2013-01-28 ENCOUNTER — Ambulatory Visit: Payer: No Typology Code available for payment source | Admitting: Psychology

## 2013-01-28 IMAGING — CR DG LUMBAR SPINE 1V
1 series · 1 of 1 positions shown · non-contrast
Comparison: MRI lumbar spine 01/28/2011

CLINICAL DATA: L4-5 microdiskectomy

LUMBAR SPINE - 1 VIEW

[view not recorded]
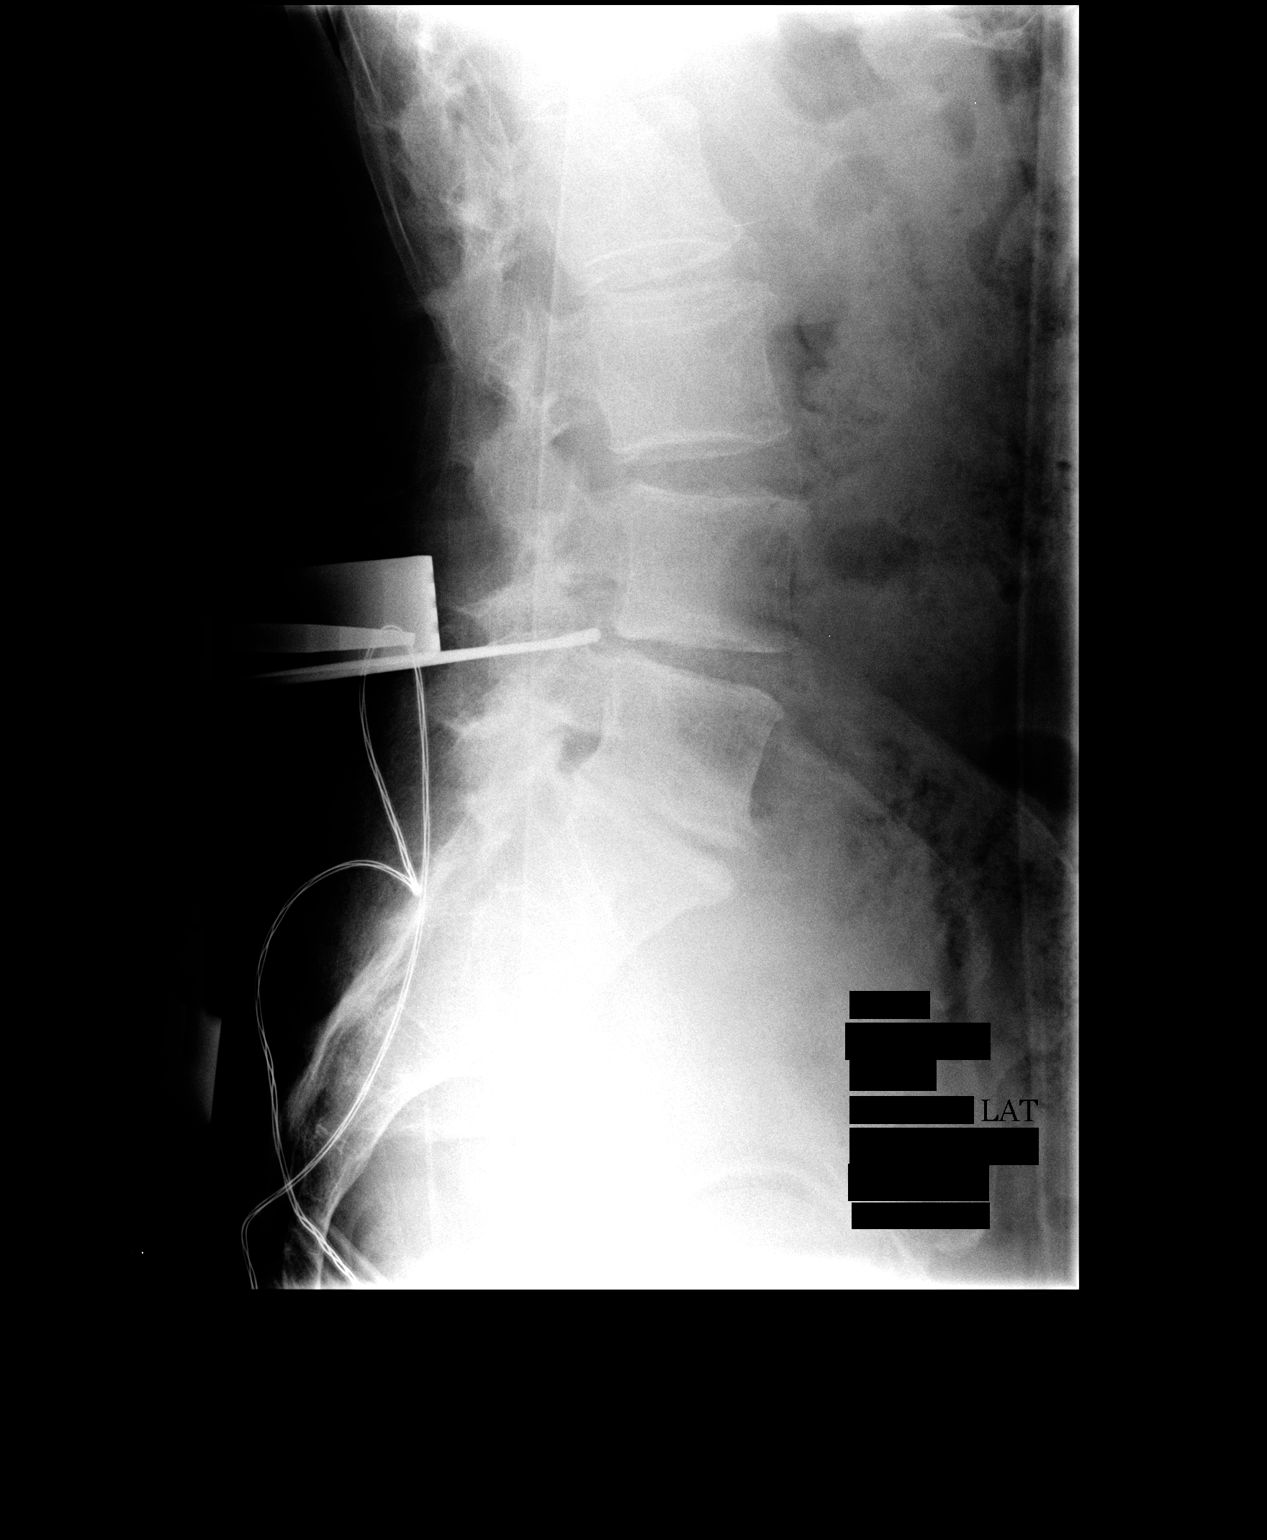

[1 of 1 positions shown; findings below may reference images not displayed]

FINDINGS: Intraoperative localization probe directed toward the L4-
5 disc space
IMPRESSION: Intraoperative localization

## 2013-02-04 ENCOUNTER — Ambulatory Visit (INDEPENDENT_AMBULATORY_CARE_PROVIDER_SITE_OTHER): Payer: No Typology Code available for payment source | Admitting: Psychiatry

## 2013-02-04 ENCOUNTER — Encounter (HOSPITAL_COMMUNITY): Payer: Self-pay | Admitting: Psychiatry

## 2013-02-04 VITALS — BP 151/89 | HR 55 | Ht 69.0 in | Wt 149.4 lb

## 2013-02-04 DIAGNOSIS — F419 Anxiety disorder, unspecified: Secondary | ICD-10-CM

## 2013-02-04 DIAGNOSIS — F411 Generalized anxiety disorder: Secondary | ICD-10-CM

## 2013-02-04 MED ORDER — SERTRALINE HCL 50 MG PO TABS: ORAL_TABLET | ORAL | Status: DC

## 2013-02-04 NOTE — Progress Notes (Signed)
Patient ID: Edward Mccall, male   DOB: [birth year], 67 y.o.   MRN: [id number] Psychiatric assessment note  Chief complaint. I have anxiety.  History presenting illness. Patient is 67 year old Caucasian married man who is self-referred for seeking treatment of his anxiety disorder.  Patient admitted having anxiety symptoms worsening the past one year.  He had multiple back surgeries and chronic medical issues.  He admitted taking Effexor Lexapro and Prozac in the past by his primary care physician and previous psychiatrist.  Lately he is taking Ativan 0.5 mg only as needed because his previous psychiatrist does not feed he need to be on any SSRIs.  Patient feels that taking Ativan sometimes making him more depressed.  Patient experiencing episodic anxiety attacks which he described as funny feeling in his body.  He feels sometimes shortness of breath and believe having heart attack.  He is experiencing on and off insomnia and that is about his medical problem and physical health.  Otherwise he is very active in his daily life.  He is involved in running, biking, screaming, digging and walking.  Patient reported his anxiety get worse after he had turned back surgery which was done in 2012.  Patient denies any crying spells, anhedonia, irritability, active or passive suicidal thoughts.  Patient is very high functional.  He had read a lot about psychotropic medication and has very reluctant to take.  He is concerned about lorazepam dependency however he takes 0.5 mg half to one tablet only as needed.  Patient described that sometime he feels tremors shakes and nervousness.  2 weeks ago patient was doing very well and even thinking to cancel the appointment but last week he has taken regularly half tablet of lorazepam to help his anxiety.  He denies any paranoia, hallucination, any excessive or compulsive symptoms.  Patient admitted that recent nervousness and anxiety is causing limitation in his daily life.  He  does not enjoy as much as he use to and he is scared to travel.  Patient also endorsed history of drinking and afraid that he is taking benzodiazepine and cannot drink socially.  He drinks with his friends and enjoyed drinking.  He denies any tremors related to drinking or any shakes.  He denies any DWI or any blackouts.  He admitted history of using binge drinking but not in recent times.  Sometime he has drink to relieve his anxiety but now he is scared because he is using benzodiazepine.  Patient denies any active or passive suicidal thoughts.  He described his energy level is normal.  He denies any rituals or any concern with height.  He is comfortable around people.  Currently he is taking benzodiazepine Ativan 0.5 mg half to one tablet as needed.  Patient denies any violence aggression.  Patient also scheduled to see therapist's Dr. Rexene Mccall next week.  Past psychiatric history. Patient denies any history of suicidal attempt or any inpatient psychiatric treatment.  He endorse long history of anxiety which has been progressively worse in recent years.  He tried Effexor in 2012 2 2013 after his third back surgery.  At that time he developed shakes but Effexor however his primary care physician encourage him to take Effexor 37.5 twice a day with Valium.  Patient took for one year and gradually, because he does not want to take psychotropic medication.  In the past he had tried Prozac which caused him more nervousness and shakes.  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  He was given Valium when he had back surgery by his primary care physician which helps him for episodic anxiety.  Patient has a history of psychosis mania or any hallucination.  Patient has a history of mood swing or anger.  Psychosocial history. Patient Edward Mccall his wife.  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  Patient is married  twice.   Alcohol and substance use history. Patient admitted drinking at age 67.  He admitted to drinking in the past denies any DWI or any withdrawal symptoms.  He drinks socially .  He denies any illegal use of substance use.    Family history. Patient endorse mother has anxiety, mother has anxiety and both take Prozac.  Father was alcoholic and using drugs.  Sibling also has some anxiety symptoms.  His grandfather gait medication box with him.    Medical history. Patient has mitral regurgitation, supraventricular premature beats , multiple back surgeries, GERD, Gilbert syndrome , history of prostate cancer , spinal  stenosis and elevated blood sugars.  His primary care physician is Edward Mccall .  Military history. None.  Review of Systems  Constitutional: Negative.   Musculoskeletal: Positive for back pain.  Skin: Negative.   Neurological: Negative.   Psychiatric/Behavioral: Negative for depression, suicidal ideas and memory loss. The patient is nervous/anxious and has insomnia.      Mental status examination. Patient is casually dressed and groomed.  He is calm cooperative and maintained good eye contact.  He described his mood is anxious and his affect is mood appropriate.  He denies any active or passive suicidal thoughts or homicidal thoughts.  He denies any auditory or visual hallucination.  There were no paranoia or delusion obsession present at this time.  His fund of knowledge is good.  His psychomotor activity is normal.  Liver no tremors or shakes present.  His thought processes logical, linear and goal-directed.  His attention and concentration is good.  He's alert oriented x3.  His insight judgment and impulse control is okay.  Assessment Axis I anxiety disorder NOS, rule out anxiety disorder due to general medical condition Axis II deferred Axis III  Patient Active Problem List   Diagnosis Date Noted  . Gynecomastia, male 07/22/2012  . Elevated blood pressure  06/25/2011  . Lipoma 04/06/2011  . Anxiety 01/02/2011  . Spinal stenosis 01/02/2011  . PROSTATE CANCER, HX OF 06/20/2010  . MITRAL REGURGITATION 11/15/2008  . SUPRAVENTRICULAR PREMATURE BEATS 11/15/2008  . 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 FASTING HYPERGLYCEMIA 10/21/2008  . GERD 05/24/2008  . Nonspecific Abnormal Electrocardiogram (ECG) (EKG) 03/25/2007   Axis IV mild Axis V 65-70  Plan I do believe patient has underlying anxiety disorder which has been worsening in the past one to 2 years.  In the past he had tried Effexor but good response however he does not want any medication that can cause withdrawal symptoms if he decided to stop.  Currently he is taking lorazepam 0.5 mg however he feels either it is not helping are causing him more depressed.  I recommend to try Zoloft which he has never tried before .  I will start 25 mg daily and gradually increase to 50 mg after one week.  I had a long discussion with the patient that psychotropic medication may cause transient side effects in that case he should not stop the medication and continue to take it because  he eventually did get better.  I recommend to call us back if he is any question of conservatively worsening of the symptom.  I explained in detail the short and long-term side effects of psychotropic medication.  He also discussed that if he start having jitteriness and worsening of anxiety that he can try taking Valium 5 mg half tablet as needed.  Patient has left or Valium from the past.  Patient also scheduled to see Dr. Rexene Mccall next week for counseling.  We discuss safety plan anytime having active suicidal thoughts or homicidal thoughts and he to call 911 or go to local emergency room.  He talked about stopping alcohol that can cause interaction with psychotropic medication .  Time spent 60 minutes.  I will see him again in 2 weeks.

## 2013-02-09 ENCOUNTER — Telehealth (HOSPITAL_COMMUNITY): Payer: Self-pay

## 2013-02-09 ENCOUNTER — Telehealth (HOSPITAL_COMMUNITY): Payer: Self-pay | Admitting: Psychiatry

## 2013-02-09 ENCOUNTER — Ambulatory Visit (INDEPENDENT_AMBULATORY_CARE_PROVIDER_SITE_OTHER): Payer: No Typology Code available for payment source | Admitting: Psychology

## 2013-02-09 DIAGNOSIS — F411 Generalized anxiety disorder: Secondary | ICD-10-CM

## 2013-02-09 NOTE — Telephone Encounter (Signed)
Call return. Patient is complaining of feeling tired with Zoloft.  Does not feel comfortable going up to dozing few days.  Recommend to continue Zoloft 25 mg for now.

## 2013-02-16 ENCOUNTER — Ambulatory Visit (HOSPITAL_COMMUNITY): Payer: Self-pay | Admitting: Psychiatry

## 2013-02-17 ENCOUNTER — Ambulatory Visit (INDEPENDENT_AMBULATORY_CARE_PROVIDER_SITE_OTHER): Payer: No Typology Code available for payment source | Admitting: Psychiatry

## 2013-02-17 ENCOUNTER — Encounter (HOSPITAL_COMMUNITY): Payer: Self-pay | Admitting: Psychiatry

## 2013-02-17 VITALS — BP 123/73 | HR 50 | Ht 69.0 in | Wt 149.8 lb

## 2013-02-17 DIAGNOSIS — F411 Generalized anxiety disorder: Secondary | ICD-10-CM

## 2013-02-17 DIAGNOSIS — F419 Anxiety disorder, unspecified: Secondary | ICD-10-CM

## 2013-02-17 MED ORDER — SERTRALINE HCL 50 MG PO TABS: ORAL_TABLET | ORAL | Status: DC

## 2013-02-17 MED ORDER — DIAZEPAM 2 MG PO TABS: 2.0000 mg | ORAL_TABLET | ORAL | Status: DC | PRN

## 2013-02-17 NOTE — Progress Notes (Signed)
[facility] [id number] Progress Note  Edward Mccall [id number] 67 y.o.   02/17/2013 12:22 PM  Chief Complaint:  Anxiety  History of Present Illness: Patient is 67 year old Caucasian married man who came for his followup appointment.  We started him on Zoloft 25 mg and recommend to take 50 mg after one week however patient is started to have concerns about increasing the medication.  He had called one week ago and wanted to stay on 25 mg.  He continues to have anxiety symptoms.  He continues to feel some time funny feeling in his body.  He admitted some time his nervousness get so bad that he questioned if Zoloft is working.  He is taking Valium 2.5 mg as needed.  He continues to have insomnia most of his nights.  He admitted continued to drink socially.  He remains very active in his social life.  He is seeing therapist regularly. Patient denies any paranoia, aggression, active or passive suicidal thoughts or homicidal thoughts.  Suicidal Ideation: No Plan Formed: No Patient has means to carry out plan: No  Homicidal Ideation: No Plan Formed: No Patient has means to carry out plan: No  Review of Systems: Psychiatric: Agitation: No Hallucination: No Depressed Mood: Yes Insomnia: Yes Hypersomnia: No Altered Concentration: No Feels Worthless: No Grandiose Ideas: No Belief In Special Powers: No New/Increased Substance Abuse: No Compulsions: No  Neurologic: Headache: No Seizure: No Paresthesias: No  Past Medical Family, Social History: Patient is living with his wife.  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  He is very active in his daily life.  He likes biking, hiking.  Patient admitted drinking at age 67.  He claims he is a social drinker but does admit sometime during to calm his anxiety.  He denies any other illegal substance use.  Patient has a mitral valve regurgitation, supraventricular premature beats, multiple back surgeries, GERD, Gilbert's syndrome,  history of prostate cancer, spinal stenosis and elevated blood sugar.  His primary care physician is Dr. Alwyn Ren.  Family history Patient endorse mother has anxiety and she takes Prozac.  Patient's father was alcoholic and using drugs.  Patient's siblings has anxiety.  Outpatient Encounter Prescriptions as of 02/17/2013  Medication Sig Dispense Refill  . erythromycin ophthalmic ointment Apply every 4- 6 hrs while awake  3.5 g  0  . lansoprazole (PREVACID) 15 MG capsule Take 15 mg by mouth. 1 by mouth 3 x weekly       . Multiple Vitamin (MULTIVITAMIN) capsule Take 1 capsule by mouth daily.        . naproxen sodium (ANAPROX) 220 MG tablet Take 220 mg by mouth 2 (two) times daily with a meal.      . Omega-3 Fatty Acids (FISH OIL) 1000 MG CAPS Take 1,000 mg by mouth 2 (two) times daily.       . sertraline (ZOLOFT) 50 MG tablet Take one daily  30 tablet  0  . [DISCONTINUED] sertraline (ZOLOFT) 50 MG tablet Take 1/2 tab daily for 1 week and than one daily  30 tablet  0  . diazepam (VALIUM) 2 MG tablet Take 1 tablet (2 mg total) by mouth as needed for anxiety.  30 tablet  0  . [DISCONTINUED] LORazepam (ATIVAN) 1 MG tablet TAKE . 5MG  TWICE A DAY       No facility-administered encounter medications on file as of 02/17/2013.    Past Psychiatric History/Hospitalization(s): Patient denies any history of suicidal attempt or any  inpatient psychiatric treatment. He endorse long history of anxiety which has been progressively worse in recent years. He tried Effexor in 2012 2 2013 after his third back surgery. At that time he developed shakes but Effexor however his primary care physician encourage him to take Effexor 37.5 twice a day with Valium. Patient took for one year and gradually, because he does not want to take psychotropic medication. In the past he had tried Prozac which caused him more nervousness and shakes. 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 Erling Cruz would try Lexapro but patient again had shakes. He was  given Valium when he had back surgery by his primary care physician which helps him for episodic anxiety. Patient has a history of psychosis mania or any hallucination. Patient has a history of mood swing or anger  Anxiety: Yes Bipolar Disorder: No Depression: Yes Mania: No Psychosis: No Schizophrenia: No Personality Disorder: No Hospitalization for psychiatric illness: No History of Electroconvulsive Shock Therapy: No Prior Suicide Attempts: No  Physical Exam: Constitutional:  BP 123/73  Pulse 50  Ht 5\' 9"  (1.753 m)  Wt 149 lb 12.8 oz (67.949 kg)  BMI 22.11 kg/m2  General Appearance: well nourished  Musculoskeletal: Strength & Muscle Tone: within normal limits Gait & Station: normal Patient leans: N/A  Psychiatric: Speech (describe rate, volume, coherence, spontaneity, and abnormalities if any): Clear and coherent.  Thought Process (describe rate, content, abstract reasoning, and computation): No flight of ideas or any loose association.  Associations: Intact  Thoughts: normal  Mental Status: Orientation: oriented to person, place, time/date and situation Mood & Affect: anxiety Attention Span & Concentration: Good  Medical Decision Making (Choose Three): Review of Psycho-Social Stressors (1), Established Problem, Worsening (2), Review of Last Therapy Session (1), Review of Medication Regimen & Side Effects (2) and Review of New Medication or Change in Dosage (2)  Assessment: Axis I: Anxiety disorder NOS  Axis II: Deferred  Axis III: See medical history  Axis IV: Mild to moderate  Axis V: 70-75   Plan: Patient has not tried Zoloft 50 mg.  He is taking 25 milligrams which is a small dose.  He is taking Valium as needed.  I recommend to try at least 50 mg Zoloft to help anxiety symptoms.  I would not consider failure with Zoloft at this time since he has not taken the appropriate dose.  He is no longer taking Ativan.  We will discontinue Ativan and recommended  take Valium 2 mg as needed.  Patient was using left lower Valium bitches expired.  I recommend that he take expired medicine.  Continue to see therapist for coping and social skills.  Recommend to call us back it is a question of conservatively worsening of the symptom.  I'll see him again in 4 weeks.  Time spent 25 minutes.  More than 50% of the time spent and psychoeducation, counseling and coordination of care.  Saw Mendenhall T., MD 02/17/2013

## 2013-02-23 ENCOUNTER — Telehealth (HOSPITAL_COMMUNITY): Payer: Self-pay | Admitting: Psychiatry

## 2013-02-23 ENCOUNTER — Telehealth (HOSPITAL_COMMUNITY): Payer: Self-pay | Admitting: *Deleted

## 2013-02-23 ENCOUNTER — Ambulatory Visit (INDEPENDENT_AMBULATORY_CARE_PROVIDER_SITE_OTHER): Payer: No Typology Code available for payment source | Admitting: Psychology

## 2013-02-23 DIAGNOSIS — F411 Generalized anxiety disorder: Secondary | ICD-10-CM

## 2013-02-23 MED ORDER — MIRTAZAPINE 15 MG PO TABS: 15.0000 mg | ORAL_TABLET | Freq: Every day | ORAL | Status: DC

## 2013-02-23 NOTE — Telephone Encounter (Signed)
Patient called.  Complaining of increased anxiety and nervousness meds Zoloft 50 mg.  Recommend to stop and try Remeron 15 mg at bedtime.  We have discuss risk and benefits of Remeron.  Explain more more time and recommend to call us back if he does not feel any better.

## 2013-02-23 NOTE — Telephone Encounter (Signed)
Pt left 3 VM about having side effects of Zoloft :nausea/headaches. His medication was increased to 50mg  and wondering if he should go back to the lower dose. He did start taking 25mg  but wants to know if this is OK to continue. Also worried about taking his Valium due to dependence.Requests call from provider.

## 2013-02-25 ENCOUNTER — Telehealth (HOSPITAL_COMMUNITY): Payer: Self-pay | Admitting: *Deleted

## 2013-02-25 NOTE — Telephone Encounter (Signed)
See call notes

## 2013-03-02 ENCOUNTER — Ambulatory Visit (INDEPENDENT_AMBULATORY_CARE_PROVIDER_SITE_OTHER): Payer: No Typology Code available for payment source | Admitting: Psychology

## 2013-03-02 DIAGNOSIS — F411 Generalized anxiety disorder: Secondary | ICD-10-CM

## 2013-03-10 ENCOUNTER — Ambulatory Visit (INDEPENDENT_AMBULATORY_CARE_PROVIDER_SITE_OTHER): Payer: No Typology Code available for payment source | Admitting: Psychology

## 2013-03-10 DIAGNOSIS — F411 Generalized anxiety disorder: Secondary | ICD-10-CM

## 2013-03-17 ENCOUNTER — Ambulatory Visit (INDEPENDENT_AMBULATORY_CARE_PROVIDER_SITE_OTHER): Payer: No Typology Code available for payment source | Admitting: Psychiatry

## 2013-03-17 ENCOUNTER — Encounter (HOSPITAL_COMMUNITY): Payer: Self-pay | Admitting: Psychiatry

## 2013-03-17 VITALS — BP 121/78 | HR 58 | Ht 69.0 in | Wt 151.6 lb

## 2013-03-17 DIAGNOSIS — F411 Generalized anxiety disorder: Secondary | ICD-10-CM

## 2013-03-17 DIAGNOSIS — F419 Anxiety disorder, unspecified: Secondary | ICD-10-CM

## 2013-03-17 MED ORDER — MIRTAZAPINE 15 MG PO TABS
15.0000 mg | ORAL_TABLET | Freq: Every day | ORAL | Status: DC
Start: 2013-03-17 — End: 2013-06-14

## 2013-03-17 NOTE — Progress Notes (Signed)
[facility] [address] Progress Note  Edward Mccall [id number] 67 y.o.   03/17/2013 11:58 AM  Chief Complaint:  Anxiety  History of Present Illness: Edward Mccall is 67 year old Caucasian married man who came for his followup appointment.  He is not taking Zoloft.  Edward Mccall is now taking Remeron 7.5 mg at bedtime.  He was given 15 mg however he called and requested to cut down the dose .  Overall Edward Mccall is doing better on 7.5 mg.  He has cut down his drinking.  He is no longer taking Valium.  He is sleeping better.  He has some anxiety spacing the morning he denies any severe panic attack.  He is seeing therapist regularly.  He denies any side effects however he continued to have a lot of concern about the medication side effects .  He remains very active in his social life.  He is seeing therapist regularly. Edward Mccall denies any paranoia, aggression, active or passive suicidal thoughts or homicidal thoughts.  Suicidal Ideation: No Plan Formed: No Edward Mccall has means to carry out plan: No  Homicidal Ideation: No Plan Formed: No Edward Mccall has means to carry out plan: No  Review of Systems: Psychiatric: Agitation: No Hallucination: No Depressed Mood: Yes Insomnia: Yes Hypersomnia: No Altered Concentration: No Feels Worthless: No Grandiose Ideas: No Belief In Special Powers: No New/Increased Substance Abuse: No Compulsions: No  Neurologic: Headache: No Seizure: No Paresthesias: No  Past Medical Family, Social History: Edward Mccall is living with his wife.  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  He is very active in his daily life.  He likes biking, hiking.  Edward Mccall admitted drinking at age 36.  He claims he is a social drinker but does admit sometime during to calm his anxiety.  He denies any other illegal substance use.  Edward Mccall has a mitral valve regurgitation, supraventricular premature beats, multiple back surgeries, GERD, Gilbert's syndrome, history of prostate cancer,  spinal stenosis and elevated blood sugar.  His primary care physician is Dr. Alwyn Ren.  Family history Edward Mccall endorse mother has anxiety and she takes Prozac.  Edward Mccall's father was alcoholic and using drugs.  Edward Mccall's siblings has anxiety.  Outpatient Encounter Prescriptions as of 03/17/2013  Medication Sig Dispense Refill  . erythromycin ophthalmic ointment Apply every 4- 6 hrs while awake  3.5 g  0  . lansoprazole (PREVACID) 15 MG capsule Take 15 mg by mouth. 1 by mouth 3 x weekly       . mirtazapine (REMERON) 15 MG tablet Take 1 tablet (15 mg total) by mouth at bedtime.  30 tablet  0  . Multiple Vitamin (MULTIVITAMIN) capsule Take 1 capsule by mouth daily.        . naproxen sodium (ANAPROX) 220 MG tablet Take 220 mg by mouth 2 (two) times daily with a meal.      . Omega-3 Fatty Acids (FISH OIL) 1000 MG CAPS Take 1,000 mg by mouth 2 (two) times daily.       . [DISCONTINUED] diazepam (VALIUM) 2 MG tablet Take 1 tablet (2 mg total) by mouth as needed for anxiety.  30 tablet  0  . [DISCONTINUED] mirtazapine (REMERON) 15 MG tablet Take 1 tablet (15 mg total) by mouth at bedtime.  30 tablet  0   No facility-administered encounter medications on file as of 03/17/2013.    Past Psychiatric History/Hospitalization(s): Edward Mccall denies any history of suicidal attempt or any inpatient psychiatric treatment. He endorse long history of anxiety which has been progressively  worse in recent years. He tried Effexor in 2012 2 2013 after his third back surgery. At that time he developed shakes but Effexor however his primary care physician encourage him to take Effexor 37.5 twice a day with Valium. Edward Mccall took for one year and gradually, because he does not want to take psychotropic medication. In the past he had tried Prozac which caused him more nervousness and shakes. 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 Erling Cruz would try Lexapro but Edward Mccall again had shakes. He was given Valium when he had back surgery by his primary  care physician which helps him for episodic anxiety. Edward Mccall has a history of psychosis mania or any hallucination. Edward Mccall has a history of mood swing or anger.  We did try Zoloft but Edward Mccall stopped due to an increase in anxiety and nervousness.  Anxiety: Yes Bipolar Disorder: No Depression: Yes Mania: No Psychosis: No Schizophrenia: No Personality Disorder: No Hospitalization for psychiatric illness: No History of Electroconvulsive Shock Therapy: No Prior Suicide Attempts: No  Physical Exam: Constitutional:  BP 121/78  Pulse 58  Ht 5\' 9"  (1.753 m)  Wt 151 lb 9.6 oz (68.765 kg)  BMI 22.38 kg/m2  General Appearance: well nourished  Musculoskeletal: Strength & Muscle Tone: within normal limits Gait & Station: normal Edward Mccall leans: N/A  Psychiatric: Speech (describe rate, volume, coherence, spontaneity, and abnormalities if any): Clear and coherent.  Thought Process (describe rate, content, abstract reasoning, and computation): No flight of ideas or any loose association.  Associations: Intact  Thoughts: normal  Mental Status: Orientation: oriented to person, place, time/date and situation Mood & Affect: anxiety Attention Span & Concentration: Good  Medical Decision Making (Choose Three): Review of Psycho-Social Stressors (1), Established Problem, Worsening (2), Review of Last Therapy Session (1) and Review of Medication Regimen & Side Effects (2)  Assessment: Axis I: Anxiety disorder NOS  Axis II: Deferred  Axis III: See medical history  Axis IV: Mild to moderate  Axis V: 70-75   Plan:  Edward Mccall is tolerating Remeron 7.5 mg.  I recommend if he continues to feel anxious every day then he should try 15 mg.  Edward Mccall is tolerating the medication without any side effects.  Recommend to see therapist regularly.  Followup in 6 weeks.  Recommend to call us if he has any questions or concerns.   ARFEEN,SYED T., MD 03/17/2013

## 2013-03-25 ENCOUNTER — Ambulatory Visit (INDEPENDENT_AMBULATORY_CARE_PROVIDER_SITE_OTHER): Payer: No Typology Code available for payment source | Admitting: Psychology

## 2013-03-25 DIAGNOSIS — F411 Generalized anxiety disorder: Secondary | ICD-10-CM

## 2013-04-06 ENCOUNTER — Telehealth (HOSPITAL_COMMUNITY): Payer: Self-pay | Admitting: *Deleted

## 2013-04-06 NOTE — Telephone Encounter (Signed)
Contacted pt with Dr. Sheela Stack instructions, left message on named Voice Mail. Change in mood may be temporary and if so, no change in medication needed. If anxiety and depression persist for the next two to three days, may increase Remeron to 15 mg. Contact office for concerns or questions

## 2013-04-06 NOTE — Telephone Encounter (Signed)
Pt left VM:Has been taking 7.5mg  of Remeron for about 4 weks.Doing well for most part.Today, feels anxious and depressed.Should he increase to 15mg  at this time or wait to see if he feels better? Sees Dr. Dellia Cloud for therapy tomorrow.

## 2013-04-07 ENCOUNTER — Ambulatory Visit (INDEPENDENT_AMBULATORY_CARE_PROVIDER_SITE_OTHER): Payer: No Typology Code available for payment source | Admitting: Psychology

## 2013-04-07 DIAGNOSIS — F411 Generalized anxiety disorder: Secondary | ICD-10-CM

## 2013-04-21 ENCOUNTER — Ambulatory Visit (INDEPENDENT_AMBULATORY_CARE_PROVIDER_SITE_OTHER): Payer: No Typology Code available for payment source | Admitting: Psychology

## 2013-04-21 DIAGNOSIS — F411 Generalized anxiety disorder: Secondary | ICD-10-CM

## 2013-04-28 ENCOUNTER — Ambulatory Visit (HOSPITAL_COMMUNITY): Payer: Self-pay | Admitting: Psychiatry

## 2013-04-28 ENCOUNTER — Ambulatory Visit (INDEPENDENT_AMBULATORY_CARE_PROVIDER_SITE_OTHER): Payer: No Typology Code available for payment source | Admitting: Psychiatry

## 2013-04-28 ENCOUNTER — Encounter (HOSPITAL_COMMUNITY): Payer: Self-pay | Admitting: Psychiatry

## 2013-04-28 VITALS — Wt 152.0 lb

## 2013-04-28 DIAGNOSIS — F419 Anxiety disorder, unspecified: Secondary | ICD-10-CM

## 2013-04-28 DIAGNOSIS — F411 Generalized anxiety disorder: Secondary | ICD-10-CM

## 2013-04-28 NOTE — Progress Notes (Signed)
[facility] [address] Progress Note  Edward Mccall [id number] 67 y.o.   04/28/2013 11:21 AM  Chief Complaint:  I am doing better.  History of Present Illness: Patient is 67 year old Caucasian married man who came for his followup appointment.  Patient is taking Remeron 7.5 mg at bedtime.  He decided not to increase Remeron.  He is feeling much better.  He has one panic attack yesterday .  He also remembered taking Valium 2.5 mg 2 weeks ago.  Otherwise he is doing better on his medication.  He is sleeping 6-7 hours.  He admitted social drinking but overall he has cut down his drinking from the past.  He continues to enjoy biking , reading and remains very active and social in his life.  He is seeing Dr. Dellia Cloud regularly and recently decided to see him once a month since his insight is under control.  He is planning to visit [place] to see his 4 weeks granddaughter .  Patient is planning to visit her daughter once she will out of her maternity leave .  Patient likes to help her daughter .  He has some anxiety due to anticipation of taking care of her grandchild however he is also excited to help her.  Patient remains very concerned about long term side effects of psychotropic medication and wondering if some days he can take for dose of Remeron.  Patient denies any suicidal thoughts or homicidal thoughts.  Suicidal Ideation: No Plan Formed: No Patient has means to carry out plan: No  Homicidal Ideation: No Plan Formed: No Patient has means to carry out plan: No  Review of Systems: Psychiatric: Agitation: No Hallucination: No Depressed Mood: No Insomnia: No Hypersomnia: No Altered Concentration: No Feels Worthless: No Grandiose Ideas: No Belief In Special Powers: No New/Increased Substance Abuse: No Compulsions: No  Neurologic: Headache: No Seizure: No Paresthesias: No  Past Medical Family, Social History:  Patient is living with his wife.  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  He is very active in his daily life.  He likes biking, hiking.  Patient admitted drinking at age 32.  He claims he is a social drinker but does admit sometime during to calm his anxiety.  He denies any other illegal substance use.  Patient has a mitral valve regurgitation, supraventricular premature beats, multiple back surgeries, GERD, Gilbert's syndrome, history of prostate cancer, spinal stenosis and elevated blood sugar.  His primary care physician is Dr. Alwyn Ren.  Family history Patient endorse mother has anxiety and she takes Prozac.  Patient's father was alcoholic and using drugs.  Patient's siblings has anxiety.  Outpatient Encounter Prescriptions as of 04/28/2013  Medication Sig Dispense Refill  . mirtazapine (REMERON) 15 MG tablet Take 1 tablet (15 mg total) by mouth at bedtime.  30 tablet  0  . erythromycin ophthalmic ointment Apply every 4- 6 hrs while awake  3.5 g  0  . lansoprazole (PREVACID) 15 MG capsule Take 15 mg by mouth. 1 by mouth 3 x weekly       . Multiple Vitamin (MULTIVITAMIN) capsule Take 1 capsule by mouth daily.        . naproxen sodium (ANAPROX) 220 MG tablet Take 220 mg by mouth 2 (two) times daily with a meal.      . Omega-3 Fatty Acids (FISH OIL) 1000 MG CAPS Take 1,000 mg by mouth 2 (two) times daily.        No facility-administered encounter medications on  file as of 04/28/2013.    Past Psychiatric History/Hospitalization(s): Patient denies any history of suicidal attempt or any inpatient psychiatric treatment. He endorse long history of anxiety which has been progressively worse in recent years. He tried Effexor in 2012-2013 after his third back surgery.  Patient took for one year and gradually stopped because he does not want to take psychotropic medication. In the past he had tried Prozac which caused him more nervousness and shakes. 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 tried Lexapro and Xanax but patient had shakes. He was  given Valium when he had back surgery by his primary care physician which helps him for episodic anxiety. Patient denies any history of psychosis mania or any hallucination. Patient denies any history of mood swing or anger.  We did try Zoloft but patient stopped due to an increase in anxiety and nervousness.  Anxiety: Yes Bipolar Disorder: No Depression: Yes Mania: No Psychosis: No Schizophrenia: No Personality Disorder: No Hospitalization for psychiatric illness: No History of Electroconvulsive Shock Therapy: No Prior Suicide Attempts: No  Physical Exam: Constitutional:  Wt 152 lb (68.947 kg)  BMI 22.44 kg/m2  General Appearance: well nourished  Musculoskeletal: Strength & Muscle Tone: within normal limits Gait & Station: normal Patient leans: N/A  Psychiatric: Speech (describe rate, volume, coherence, spontaneity, and abnormalities if any): Clear and coherent.  Thought Process (describe rate, content, abstract reasoning, and computation): No flight of ideas or any loose association.  Associations: Intact  Thoughts: normal  Mental Status: Orientation: oriented to person, place, time/date and situation Mood & Affect: anxiety Attention Span & Concentration: Good  Medical Decision Making (Choose Three): Review of Psycho-Social Stressors (1), Established Problem, Worsening (2), Review of Last Therapy Session (1) and Review of Medication Regimen & Side Effects (2)  Assessment: Axis I: Anxiety disorder NOS  Axis II: Deferred  Axis III: See medical history  Axis IV: Mild to moderate  Axis V: 70-75   Plan:  Patient is tolerating Remeron 7.5 mg.  he does not want higher dose on a regular basis however he like to try full dose of 50 mg when he gets very anxious and nervous.  Patient overall has improved from the past.  He has cut down his drinking.  I explained that if he he needs persistent Remeron 50 mg than he should take it on a regular basis.  Recommend to see Dr.  Dellia Cloud for coping and social skills.  Patient has refill remaining .  He will call us when he need a new one.  Followup in 2 months.  Recommend to call us if he has any questions or concerns.   Clema Skousen T., MD 04/28/2013

## 2013-05-25 ENCOUNTER — Ambulatory Visit (INDEPENDENT_AMBULATORY_CARE_PROVIDER_SITE_OTHER): Payer: No Typology Code available for payment source | Admitting: Psychology

## 2013-05-25 DIAGNOSIS — F411 Generalized anxiety disorder: Secondary | ICD-10-CM

## 2013-05-26 ENCOUNTER — Ambulatory Visit (INDEPENDENT_AMBULATORY_CARE_PROVIDER_SITE_OTHER): Payer: Medicare Other

## 2013-05-26 DIAGNOSIS — Z23 Encounter for immunization: Secondary | ICD-10-CM

## 2013-06-14 ENCOUNTER — Telehealth (HOSPITAL_COMMUNITY): Payer: Self-pay | Admitting: *Deleted

## 2013-06-14 DIAGNOSIS — F419 Anxiety disorder, unspecified: Secondary | ICD-10-CM

## 2013-06-14 MED ORDER — MIRTAZAPINE 15 MG PO TABS: 15.0000 mg | ORAL_TABLET | Freq: Every day | ORAL | Status: DC

## 2013-06-14 NOTE — Telephone Encounter (Signed)
See contact notes 

## 2013-06-16 ENCOUNTER — Ambulatory Visit: Payer: No Typology Code available for payment source | Admitting: Psychology

## 2013-06-22 ENCOUNTER — Ambulatory Visit (INDEPENDENT_AMBULATORY_CARE_PROVIDER_SITE_OTHER): Payer: No Typology Code available for payment source | Admitting: Psychology

## 2013-06-22 DIAGNOSIS — F411 Generalized anxiety disorder: Secondary | ICD-10-CM

## 2013-06-30 ENCOUNTER — Encounter (HOSPITAL_COMMUNITY): Payer: Self-pay | Admitting: Psychiatry

## 2013-06-30 ENCOUNTER — Ambulatory Visit (INDEPENDENT_AMBULATORY_CARE_PROVIDER_SITE_OTHER): Payer: No Typology Code available for payment source | Admitting: Psychiatry

## 2013-06-30 VITALS — BP 132/80 | HR 60 | Ht 69.0 in | Wt 160.8 lb

## 2013-06-30 DIAGNOSIS — F419 Anxiety disorder, unspecified: Secondary | ICD-10-CM

## 2013-06-30 DIAGNOSIS — F411 Generalized anxiety disorder: Secondary | ICD-10-CM

## 2013-06-30 MED ORDER — MIRTAZAPINE 15 MG PO TABS: 15.0000 mg | ORAL_TABLET | Freq: Every day | ORAL | Status: DC

## 2013-06-30 NOTE — Progress Notes (Signed)
[facility] [address] Progress Note  WAH SABIC [id number] 67 y.o.   06/30/2013 11:54 AM  Chief Complaint:  Medication management and followup.  History of Present Illness: Edward Mccall came for his followup appointment.  He is doing better in the past few weeks.  He is taking Remeron 7.5 mg however there are times when he has taken 15 mg .  He has not taken Valium consistently .  He remembered taking Valium on November 4 and October 28 .  Overall he is doing much better.  He is active social helping his daughter.  He admitted continues to drinking socially but denies any binge drinking.  He continues to enjoy biking , reading and walking.  He is seeing Dr. Dellia Cloud once a month.  He denies any panic attack.  He is sleeping better.  He enjoys the company of his grandchild.  He admitted that more anxious and he read about the psychotropic medication on Internet.  Patient remains very concerned about long term side effects of psychotropic medication however he was to continue since it is working at this time.    Suicidal Ideation: No Plan Formed: No Patient has means to carry out plan: No  Homicidal Ideation: No Plan Formed: No Patient has means to carry out plan: No  Review of Systems: Psychiatric: Agitation: No Hallucination: No Depressed Mood: No Insomnia: No Hypersomnia: No Altered Concentration: No Feels Worthless: No Grandiose Ideas: No Belief In Special Powers: No New/Increased Substance Abuse: No Compulsions: No  Neurologic: Headache: No Seizure: No Paresthesias: No  Past Medical Family, Social History:  Patient is living with his wife.  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  He is very active in his daily life.  He likes biking, hiking.  Patient admitted drinking at age 38.  He claims he is a social drinker but does admit sometime during to calm his anxiety.  He denies any other illegal substance use.  Patient has a mitral valve regurgitation,  supraventricular premature beats, multiple back surgeries, GERD, Gilbert's syndrome, history of prostate cancer, spinal stenosis and elevated blood sugar.  His primary care physician is Dr. Alwyn Ren.  Family history Patient endorse mother has anxiety and she takes Prozac.  Patient's father was alcoholic and using drugs.  Patient's siblings has anxiety.  Outpatient Encounter Prescriptions as of 06/30/2013  Medication Sig  . lansoprazole (PREVACID) 15 MG capsule Take 15 mg by mouth. 1 by mouth 3 x weekly   . mirtazapine (REMERON) 15 MG tablet Take 1 tablet (15 mg total) by mouth at bedtime.  . Multiple Vitamin (MULTIVITAMIN) capsule Take 1 capsule by mouth daily.    . naproxen sodium (ANAPROX) 220 MG tablet Take 220 mg by mouth 2 (two) times daily with a meal.  . Omega-3 Fatty Acids (FISH OIL) 1000 MG CAPS Take 1,000 mg by mouth 2 (two) times daily.   . [DISCONTINUED] mirtazapine (REMERON) 15 MG tablet Take 1 tablet (15 mg total) by mouth at bedtime.  . [DISCONTINUED] erythromycin ophthalmic ointment Apply every 4- 6 hrs while awake    Past Psychiatric History/Hospitalization(s): Patient denies any history of suicidal attempt or any inpatient psychiatric treatment. He endorse history of anxiety. He tried Effexor in 2012-2013 after his third back surgery.  Patient took for one year and gradually stopped because he does not want to take psychotropic medication. In the past he had tried Prozac which caused him more nervousness and shakes. 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 tried  Lexapro and Xanax but patient had shakes. He was given Valium when he had back surgery by his primary care physician which helps him for episodic anxiety. Patient denies any history of psychosis mania or any hallucination. Patient denies any history of mood swing or anger.  We did try Zoloft but patient stopped due to an increase in anxiety and nervousness.  Anxiety: Yes Bipolar Disorder: No Depression: Yes Mania:  No Psychosis: No Schizophrenia: No Personality Disorder: No Hospitalization for psychiatric illness: No History of Electroconvulsive Shock Therapy: No Prior Suicide Attempts: No  Physical Exam: Constitutional:  BP 132/80  Pulse 60  Ht 5\' 9"  (1.753 m)  Wt 160 lb 12.8 oz (72.938 kg)  BMI 23.74 kg/m2  General Appearance: well nourished  Musculoskeletal: Strength & Muscle Tone: within normal limits Gait & Station: normal Patient leans: N/A  Psychiatric: Speech (describe rate, volume, coherence, spontaneity, and abnormalities if any): Clear and coherent.  Thought Process (describe rate, content, abstract reasoning, and computation): No flight of ideas or any loose association.  Associations: Intact  Thoughts: normal  Mental Status: Orientation: oriented to person, place, time/date and situation Mood & Affect: anxiety Attention Span & Concentration: Good  Medical Decision Making (Choose Three): Review of Psycho-Social Stressors (1), Established Problem, Worsening (2), Review of Last Therapy Session (1) and Review of Medication Regimen & Side Effects (2)  Assessment: Axis I: Anxiety disorder NOS  Axis II: Deferred  Axis III: See medical history  Axis IV: Mild to moderate  Axis V: 70-75   Plan:  Patient is tolerating Remeron 7.5 mg,  there are times when he has taken 15 mg without any side effects.  Patient is overall improved from the past.  Recommend to see Dr. Dellia Cloud for coping and social skills.  Recommend to call us back if he has any question or any concern.  Followup in 3 months.   ARFEEN,SYED T., MD 06/30/2013

## 2013-07-07 ENCOUNTER — Telehealth (HOSPITAL_COMMUNITY): Payer: Self-pay | Admitting: *Deleted

## 2013-07-07 NOTE — Telephone Encounter (Signed)
Patient has concerns relating to mood being up and down lately.States he was on a good day when he last saw MD, so he did not tell him. States he has increased anxiety at times as well.  Reviewed pt medications with him. He states he is taking Remeron 7.5 mg currently, as he tried 15 mg for 8 days, but thought it might be causing anxiety so he cut back. Advised pt that he can take as prescribed if desired to help with moods. Also advised pt to continue with therapist, he says he is not sure how much it is helping.Encouraged him to talk with him regarding his moods. Advised pt to consider earlier appt with MD than the one scheduled 09/30/13, as he has many questions regarding medications. Patient states if his moods don't level out, he will do that.

## 2013-07-20 ENCOUNTER — Ambulatory Visit (INDEPENDENT_AMBULATORY_CARE_PROVIDER_SITE_OTHER): Payer: No Typology Code available for payment source | Admitting: Psychology

## 2013-07-20 DIAGNOSIS — F411 Generalized anxiety disorder: Secondary | ICD-10-CM

## 2013-08-17 ENCOUNTER — Ambulatory Visit (INDEPENDENT_AMBULATORY_CARE_PROVIDER_SITE_OTHER): Payer: Medicare FFS | Admitting: Psychology

## 2013-08-17 DIAGNOSIS — F411 Generalized anxiety disorder: Secondary | ICD-10-CM

## 2013-08-18 ENCOUNTER — Ambulatory Visit (HOSPITAL_COMMUNITY): Payer: Self-pay | Admitting: Psychiatry

## 2013-08-18 ENCOUNTER — Telehealth (HOSPITAL_COMMUNITY): Payer: Self-pay

## 2013-08-18 NOTE — Telephone Encounter (Signed)
I returned patient's phone call.  He is complaining of tingling since he started Remeron 15 mg.  He also cut down his caffeine and stop drinking so he is unsure if he has any withdrawal symptoms.  I explained that withdrawal symptoms should go away in a few days however if he does not feel any improvement and consider lowering the Remeron to 7.5.  Recommend to call us back if symptoms does not improved in one week.

## 2013-08-20 ENCOUNTER — Ambulatory Visit (INDEPENDENT_AMBULATORY_CARE_PROVIDER_SITE_OTHER): Payer: Medicare FFS | Admitting: Cardiovascular Disease

## 2013-08-20 ENCOUNTER — Encounter: Payer: Self-pay | Admitting: Cardiovascular Disease

## 2013-08-20 VITALS — BP 132/80 | HR 53 | Ht 70.0 in | Wt 162.0 lb

## 2013-08-20 DIAGNOSIS — I059 Rheumatic mitral valve disease, unspecified: Secondary | ICD-10-CM

## 2013-08-20 DIAGNOSIS — R002 Palpitations: Secondary | ICD-10-CM

## 2013-08-20 DIAGNOSIS — I340 Nonrheumatic mitral (valve) insufficiency: Secondary | ICD-10-CM

## 2013-08-20 NOTE — Patient Instructions (Addendum)
Your physician wants you to follow-up in:  12 months.  You will receive a reminder letter in the mail two months in advance. If you don't receive a letter, please call our office to schedule the follow-up appointment.   

## 2013-08-20 NOTE — Progress Notes (Signed)
History of Present Illness: 68 yo male with history of mitral regurgitation, anxiety and GERD who is here today for cardiac follow up. He has been followed by Dr. Johnsie Cancel in the past. I met him for the first time January 2014. He has a history of non-specific ECG changes but no structural heart disease by echo or myoview. Echo January 2014 with normal LV size and function, mild MR, mild LVH. Per report, stress myoview without ischemia around 2009.   He tells me today that he has been doing well. He has had some palpitations when he is anxious. No chest pain or SOB. He is very active and exercises daily. He rode 5300 miles on his bike this year.   Primary Care Physician: Linna Darner  Past Medical History  Diagnosis Date  . 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 MITRAL REGURGITATION 11/15/2008    Mild to moderate by echo 2010  . GERD 05/24/2008  . Anxiety 01/02/2011  . Spinal stenosis 01/02/2011  . Prostate cancer   . Gynecomastia     Past Surgical History  Procedure Laterality Date  . Lumbar disc surgery  11/2010    L4-5 discectomy & laminectomy, Dr Ellene Route   . Lumbar disc surgery  01/2011    L4-5 discectomy,Dr Elsner  . Lumbar disc surgery  09/2011    L4-5 minimally invasive fusion , Dr Myrene Buddy, [facility]  . Prostatectomy  2011  . Tooth extraction      Current Outpatient Prescriptions  Medication Sig Dispense Refill  . lansoprazole (PREVACID) 15 MG capsule Take 15 mg by mouth. 1 by mouth 3 x weekly       . mirtazapine (REMERON) 15 MG tablet Take 1 tablet (15 mg total) by mouth at bedtime.  30 tablet  0  . Multiple Vitamin (MULTIVITAMIN) capsule Take 1 capsule by mouth daily.        . naproxen sodium (ANAPROX) 220 MG tablet Take 220 mg by mouth 2 (two) times daily with a meal.      . Omega-3 Fatty Acids (FISH OIL) 1000 MG CAPS Take 1,000 mg by mouth 2 (two) times daily.        No current facility-administered medications for this visit.    No Known Allergies  History   Social History  .  Marital Status: Married    Spouse Name: N/A    Number of Children: 2  . Years of Education: N/A   Occupational History  . Retired-works part time at [employer] Topics  . Smoking status: Former Smoker -- 1.00 packs/day for 10 years    Types: Cigarettes    Quit date: 08/12/1973  . Smokeless tobacco: Not on file     Comment: Quit x 40 yrs ago   . Alcohol Use: 3.5 oz/week    7 drink(s) per week  . Drug Use: No  . Sexual Activity: Not on file   Other Topics Concern  . Not on file   Social History Narrative  . No narrative on file    Family History  Problem Relation Age of Onset  . Anxiety disorder Mother   . Anxiety disorder Son   . Anxiety disorder Brother   . Alcohol abuse Father   . CAD Neg Hx     Review of Systems:  As stated in the HPI and otherwise negative.   BP 132/80  Pulse 53  Ht _0  (1.778 m)  Wt 162 lb (73.483 kg)  BMI 23.24 kg/m2  Physical Examination: General: Well developed, well nourished, NAD HEENT: OP clear, mucus membranes moist SKIN: warm, dry. No rashes. Neuro: No focal deficits Musculoskeletal: Muscle strength 5/5 all ext Psychiatric: Mood and affect normal Neck: No JVD, no carotid bruits, no thyromegaly, no lymphadenopathy. Lungs:Clear bilaterally, no wheezes, rhonci, crackles Cardiovascular: Regular rate and rhythm. Slight systolic murmur. No gallops or rubs. Abdomen:Soft. Bowel sounds present. Non-tender.  Extremities: No lower extremity edema. Pulses are 2 + in the bilateral DP/PT.  EKG: Sinus bradycardia, rate 53bpm. Old inferior ST depression with T wave inversions.   Echo January 2014: Left ventricle: The cavity size was normal. Wall thickness was increased in a pattern of mild LVH. Systolic function was normal. The estimated ejection fraction was in the range of 60% to 65%. Doppler parameters are consistent with abnormal left ventricular relaxation (grade 1 diastolic dysfunction). - Mitral valve: Mild  regurgitation.  Assessment and Plan:   1. Mitral regurgitation: Mild by echo January 2014. Repeat echo January 2017  2. Palpitations: Known to have PACs. Palpitations resolved with use of Ativan. He will call if he has worsening of palpitations or increase in frequency. Avoid stimulants and etoh.

## 2013-08-31 ENCOUNTER — Ambulatory Visit (INDEPENDENT_AMBULATORY_CARE_PROVIDER_SITE_OTHER): Payer: Medicare FFS | Admitting: Psychology

## 2013-08-31 DIAGNOSIS — F411 Generalized anxiety disorder: Secondary | ICD-10-CM

## 2013-09-01 ENCOUNTER — Ambulatory Visit (INDEPENDENT_AMBULATORY_CARE_PROVIDER_SITE_OTHER): Payer: Medicare FFS | Admitting: Psychiatry

## 2013-09-01 ENCOUNTER — Encounter (HOSPITAL_COMMUNITY): Payer: Self-pay | Admitting: Psychiatry

## 2013-09-01 VITALS — BP 131/72 | HR 67 | Ht 69.0 in | Wt 160.2 lb

## 2013-09-01 DIAGNOSIS — F411 Generalized anxiety disorder: Secondary | ICD-10-CM

## 2013-09-01 DIAGNOSIS — F419 Anxiety disorder, unspecified: Secondary | ICD-10-CM

## 2013-09-01 MED ORDER — MIRTAZAPINE 15 MG PO TABS: ORAL_TABLET | ORAL | Status: DC

## 2013-09-01 NOTE — Progress Notes (Signed)
[facility] [phone] Progress Note  ORACIO GALEN [id number] 68 y.o.   09/01/2013 4:41 PM  Chief Complaint:  Medication management and followup.  History of Present Illness: Joslyn Hy came earlier than his her scheduled appointment.  He is complaining of increased anxiety and nervousness.  He had cut down his Remeron to10.5 for a few days but recently go back on 15 mg.  The patient has episodes of anxiety and nervousness but he also has good days .  Overall he has cut down his drinking but admitted drinking to Saturday.  He continues to engage in biking and continues to do this out of activities.  However he is upset on his anxiety and bad days then he feels very nervous and anxious.  He denies any insomnia.  He denies any irritability, anger or any suicidal thoughts.  He continues to take Valium on and off.  The patient has habit of taking the medication on his own and sometime he takes Valium and Remeron according to his needs.  He recently seen a cardiologist and there has been no changes in his medication.  He has PVCs.  Suicidal Ideation: No Plan Formed: No Patient has means to carry out plan: No  Homicidal Ideation: No Plan Formed: No Patient has means to carry out plan: No  Review of Systems: Psychiatric: Agitation: No Hallucination: No Depressed Mood: No Insomnia: No Hypersomnia: No Altered Concentration: No Feels Worthless: No Grandiose Ideas: No Belief In Special Powers: No New/Increased Substance Abuse: No Compulsions: No  Neurologic: Headache: No Seizure: No Paresthesias: No  Past Medical Family, Social History:  Patient is living with his wife.  He's working as a Retail buyer.  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  He is very active in his daily life.  He likes biking, hiking.  Patient admitted drinking at age 41.  He claims he is a social drinker but does admit sometime during to calm his anxiety.  He denies any other illegal substance use.  Patient has a mitral valve  regurgitation, supraventricular premature beats, multiple back surgeries, GERD, Gilbert's syndrome, history of prostate cancer, spinal stenosis and elevated blood sugar.  His primary care physician is Dr. Linna Darner.   Outpatient Encounter Prescriptions as of 09/01/2013  Medication Sig  . lansoprazole (PREVACID) 15 MG capsule Take 15 mg by mouth. 1 by mouth 3 x weekly   . mirtazapine (REMERON) 15 MG tablet Take 1 and 1/2 tab at bed time  . Multiple Vitamin (MULTIVITAMIN) capsule Take 1 capsule by mouth daily.    . naproxen sodium (ANAPROX) 220 MG tablet Take 220 mg by mouth 2 (two) times daily with a meal.  . Omega-3 Fatty Acids (FISH OIL) 1000 MG CAPS Take 1,000 mg by mouth 2 (two) times daily.   . [DISCONTINUED] mirtazapine (REMERON) 15 MG tablet Take 1 tablet (15 mg total) by mouth at bedtime.    Past Psychiatric History/Hospitalization(s): Patient denies any history of suicidal attempt or any inpatient psychiatric treatment. He endorse history of anxiety. He tried Effexor in 2012-2013 after his third back surgery.  Patient took for one year and gradually stopped because he does not want to take psychotropic medication. In the past he had tried Prozac which caused him more nervousness and shakes. Last year [name] psychotherapist and Dr. Jeb Levering tried Lexapro and Xanax but patient had shakes. He was given Valium when he had back surgery by his primary care physician which helps him for episodic anxiety. Patient denies any history of psychosis mania or any  hallucination. Patient denies any history of mood swing or anger.  We did try Zoloft but patient stopped due to an increase in anxiety and nervousness.  Anxiety: Yes Bipolar Disorder: No Depression: Yes Mania: No Psychosis: No Schizophrenia: No Personality Disorder: No Hospitalization for psychiatric illness: No History of Electroconvulsive Shock Therapy: No Prior Suicide Attempts: No  Physical Exam: Constitutional:  BP 131/72  Pulse 67  Ht 5'  9" (1.753 m)  Wt 160 lb 3.2 oz (72.666 kg)  BMI 23.65 kg/m2  General Appearance: well nourished  No results found for this or any previous visit (from the past 2160 hour(s)). Musculoskeletal: Strength & Muscle Tone: within normal limits Gait & Station: normal Patient leans: N/A  Psychiatric: Speech (describe rate, volume, coherence, spontaneity, and abnormalities if any): Clear and coherent.  Thought Process (describe rate, content, abstract reasoning, and computation): No flight of ideas or any loose association.  Associations: Intact  Thoughts: normal  Mental Status: Orientation: oriented to person, place, time/date and situation Mood & Affect: anxiety Attention Span & Concentration: Good  Medical Decision Making (Choose Three): Review of Psycho-Social Stressors (1), Review of Last Therapy Session (1), Review of Medication Regimen & Side Effects (2) and Review of New Medication or Change in Dosage (2)  Assessment: Axis I: Anxiety disorder NOS  Axis II: Deferred  Axis III: See medical history  Axis IV: Mild to moderate  Axis V: 70-75   Plan:  Reassurance given.  Recommend to increase Remeron 22.5 mg to help anxiety.  Discuss to take medication as prescribed .  Discuss risk and benefits of medication .  We'll followup in 4 weeks.  Damaya Channing T., MD 09/01/2013

## 2013-09-09 ENCOUNTER — Other Ambulatory Visit (HOSPITAL_COMMUNITY): Payer: Self-pay | Admitting: Psychiatry

## 2013-09-09 ENCOUNTER — Telehealth (HOSPITAL_COMMUNITY): Payer: Self-pay

## 2013-09-09 NOTE — Telephone Encounter (Signed)
I returned patient's phone call.  He has noticed an increase in anxiety since Remeron was increased.  I recommended he reduce the dose to 50 mg.  He has taken Valium as a when necessary.  Recommend to use the Valium 2 mg half to 1 tablet as needed.  Patient has appointment on the 19th.  We will discuss further on his appointment.

## 2013-09-10 ENCOUNTER — Ambulatory Visit: Payer: Self-pay | Admitting: Cardiovascular Disease

## 2013-09-14 ENCOUNTER — Ambulatory Visit (INDEPENDENT_AMBULATORY_CARE_PROVIDER_SITE_OTHER): Payer: Medicare FFS | Admitting: Psychology

## 2013-09-14 DIAGNOSIS — F411 Generalized anxiety disorder: Secondary | ICD-10-CM

## 2013-09-24 ENCOUNTER — Other Ambulatory Visit (INDEPENDENT_AMBULATORY_CARE_PROVIDER_SITE_OTHER): Payer: Medicare FFS

## 2013-09-24 ENCOUNTER — Encounter: Payer: Self-pay | Admitting: Internal Medicine

## 2013-09-24 ENCOUNTER — Ambulatory Visit (INDEPENDENT_AMBULATORY_CARE_PROVIDER_SITE_OTHER): Payer: Medicare FFS | Admitting: Internal Medicine

## 2013-09-24 VITALS — BP 138/70 | HR 44 | Temp 97.6°F | Ht 69.25 in | Wt 159.8 lb

## 2013-09-24 DIAGNOSIS — Z0000 Encounter for general adult medical examination without abnormal findings: Secondary | ICD-10-CM

## 2013-09-24 DIAGNOSIS — F419 Anxiety disorder, unspecified: Secondary | ICD-10-CM

## 2013-09-24 DIAGNOSIS — IMO0001 Reserved for inherently not codable concepts without codable children: Secondary | ICD-10-CM

## 2013-09-24 DIAGNOSIS — E785 Hyperlipidemia, unspecified: Secondary | ICD-10-CM

## 2013-09-24 DIAGNOSIS — F411 Generalized anxiety disorder: Secondary | ICD-10-CM

## 2013-09-24 DIAGNOSIS — R030 Elevated blood-pressure reading, without diagnosis of hypertension: Secondary | ICD-10-CM

## 2013-09-24 DIAGNOSIS — K219 Gastro-esophageal reflux disease without esophagitis: Secondary | ICD-10-CM

## 2013-09-24 DIAGNOSIS — R7309 Other abnormal glucose: Secondary | ICD-10-CM

## 2013-09-24 DIAGNOSIS — Z8546 Personal history of malignant neoplasm of prostate: Secondary | ICD-10-CM

## 2013-09-24 LAB — BASIC METABOLIC PANEL
BUN: 15 mg/dL (ref 6–23)
CO2: 29 mEq/L (ref 19–32)
Calcium: 10.1 mg/dL (ref 8.4–10.5)
Chloride: 103 mEq/L (ref 96–112)
Creatinine, Ser: 0.8 mg/dL (ref 0.4–1.5)
GFR: 103.57 mL/min (ref >60.00)
GLUCOSE: 109 mg/dL — AB (ref 70–99)
POTASSIUM: 4.8 meq/L (ref 3.5–5.1)
SODIUM: 140 meq/L (ref 135–145)

## 2013-09-24 LAB — CBC WITH DIFFERENTIAL/PLATELET
Basophils Absolute: 0 10*3/uL (ref 0.0–0.1)
Basophils Relative: 0.7 % (ref 0.0–3.0)
EOS PCT: 1.5 % (ref 0.0–5.0)
Eosinophils Absolute: 0.1 10*3/uL (ref 0.0–0.7)
HEMATOCRIT: 46 % (ref 39.0–52.0)
HEMOGLOBIN: 15.4 g/dL (ref 13.0–17.0)
Lymphocytes Relative: 24 % (ref 12.0–46.0)
Lymphs Abs: 1.4 10*3/uL (ref 0.7–4.0)
MCHC: 33.5 g/dL (ref 30.0–36.0)
MCV: 94 fl (ref 78.0–100.0)
MONOS PCT: 6 % (ref 3.0–12.0)
Monocytes Absolute: 0.3 10*3/uL (ref 0.1–1.0)
NEUTROS ABS: 3.9 10*3/uL (ref 1.4–7.7)
Neutrophils Relative %: 67.8 % (ref 43.0–77.0)
Platelets: 242 10*3/uL (ref 150.0–400.0)
RBC: 4.89 Mil/uL (ref 4.22–5.81)
RDW: 13 % (ref 11.5–14.6)
WBC: 5.8 10*3/uL (ref 4.5–10.5)

## 2013-09-24 LAB — LIPID PANEL
Cholesterol: 224 mg/dL — ABNORMAL HIGH (ref 0–200)
HDL: 88.4 mg/dL (ref >39.00)
Total CHOL/HDL Ratio: 3
Triglycerides: 80 mg/dL (ref 0.0–149.0)
VLDL: 16 mg/dL (ref 0.0–40.0)

## 2013-09-24 LAB — HEPATIC FUNCTION PANEL
ALK PHOS: 56 U/L (ref 39–117)
ALT: 22 U/L (ref 0–53)
AST: 23 U/L (ref 0–37)
Albumin: 4.4 g/dL (ref 3.5–5.2)
Bilirubin, Direct: 0.1 mg/dL (ref 0.0–0.3)
TOTAL PROTEIN: 7.3 g/dL (ref 6.0–8.3)
Total Bilirubin: 1.2 mg/dL (ref 0.3–1.2)

## 2013-09-24 LAB — TSH: TSH: 0.96 u[IU]/mL (ref 0.35–5.50)

## 2013-09-24 LAB — LDL CHOLESTEROL, DIRECT: Direct LDL: 119.9 mg/dL

## 2013-09-24 LAB — HEMOGLOBIN A1C: Hgb A1c MFr Bld: 5.7 % (ref 4.6–6.5)

## 2013-09-24 NOTE — Progress Notes (Signed)
Subjective:    Patient ID: Edward Mccall, male    DOB: 08/08/1946, 68 y.o.   MRN: [id number]  HPI  Medicare Wellness Visit: Psychosocial and medical history were reviewed as required by Medicare (history related to abuse, antisocial behavior , firearm risk). Social history: Caffeine:minimal  , Alcohol: < 2/week , Tobacco AQT:MAUQ 1975 Exercise:up to 15 hrs/ week Personal safety/fall risk:no Limitations of activities of daily living:no Seatbelt/ smoke alarm use:yes Healthcare Power of Attorney/Living Will status: in place Ophthalmologic exam status:UTD Hearing evaluation status:not current Orientation: Oriented X 3 Memory and recall: good Math testing: good Depression/anxiety assessment: only when anxious Foreign travel history:2007 [place] Immunization status for influenza/pneumonia/ shingles /tetanus:shingles needed Transfusion history:no Preventive health care maintenance status: Colonoscopy as per protocol/standard care:UTD Dental care:every 6 mos Chart reviewed and updated. Active issues reviewed and addressed as documented below.    Review of Systems On Prevacid 3 days / week he denies dyspepsia, dysphagia, unexplained weight loss, abdominal pain, melena, rectal bleeding, or small caliber stools.Colonoscopy up to date.  He is on Remeron from Dr. Raymond Gurney  is concerned about possible elevated triglycerides on this medicine. He was intolerant to lorazepam as well as several SSRIs.     Objective:   Physical Exam Gen.: Healthy and well-nourished in appearance. Alert, appropriate and cooperative throughout exam. Appears younger than stated age  Head: Normocephalic without obvious abnormalities; beard & moustache; pattern alopecia  Eyes: No corneal or conjunctival inflammation noted. Pupils equal round reactive to light and accommodation. Extraocular motion intact.  Ears: External  ear exam reveals no significant lesions or deformities. Canals clear .TMs normal. Hearing is  grossly slightly decreased o L. Nose: External nasal exam reveals no deformity or inflammation. Nasal mucosa are pink and moist. No lesions or exudates noted.   Mouth: Oral mucosa and oropharynx reveal no lesions or exudates. Teeth in good repair. Neck: No deformities, masses, or tenderness noted. Range of motion decreased. Thyroid normal. Lungs: Normal respiratory effort; chest expands symmetrically. Lungs are clear to auscultation without rales, wheezes, or increased work of breathing. Heart: Normal rate and rhythm. Normal S1 and S2. No gallop, click, or rub. Grade 1/2 over 6 systolic murmur. Abdomen: Bowel sounds normal; abdomen soft and nontender. No masses, organomegaly or hernias noted. Genitalia:  as per Dr Alinda Money                                  Musculoskeletal/extremities: No deformity or scoliosis noted of  the thoracic or lumbar spine.  No clubbing, cyanosis, edema, or significant extremity  deformity noted. Range of motion normal .Tone & strength normal. Hand joints normal . Fingernail  health good. Able to lie down & sit up w/o help. Negative SLR bilaterally Vascular: Carotid, radial artery, dorsalis pedis and  posterior tibial pulses are full and equal. No bruits present. Neurologic: Alert and oriented x3. Deep tendon reflexes symmetrical and normal.      Skin: Intact without suspicious lesions or rashes. Lymph: No cervical, axillary lymphadenopathy present. Psych: Mood and affect are normal. Normally interactive  Assessment & Plan:  #1 Medicare Wellness Exam; criteria met ; data entered #2 Problem List/Diagnoses reviewed Plan:  Assessments made/ Orders entered  

## 2013-09-24 NOTE — Progress Notes (Signed)
Pre visit review using our clinic review tool, if applicable. No additional management support is needed unless otherwise documented below in the visit note. 

## 2013-09-24 NOTE — Patient Instructions (Signed)
Your next office appointment will be determined based upon review of your pending labs. Those instructions will be transmitted to you through My Chart . 

## 2013-09-30 ENCOUNTER — Encounter (HOSPITAL_COMMUNITY): Payer: Self-pay | Admitting: Psychiatry

## 2013-09-30 ENCOUNTER — Ambulatory Visit (INDEPENDENT_AMBULATORY_CARE_PROVIDER_SITE_OTHER): Payer: Medicare FFS | Admitting: Psychiatry

## 2013-09-30 VITALS — BP 177/87 | HR 56 | Ht 69.0 in | Wt 164.6 lb

## 2013-09-30 DIAGNOSIS — F419 Anxiety disorder, unspecified: Secondary | ICD-10-CM

## 2013-09-30 DIAGNOSIS — F411 Generalized anxiety disorder: Secondary | ICD-10-CM

## 2013-09-30 MED ORDER — DIAZEPAM 2 MG PO TABS: 2.0000 mg | ORAL_TABLET | ORAL | Status: DC | PRN

## 2013-09-30 NOTE — Progress Notes (Signed)
Edward Mccall [id number] Progress Note  Edward Mccall [id number] 68 y.o.   09/30/2013 2:00 PM  Chief Complaint:  I want to come off from Remeron.    History of Present Illness: Edward Mccall came for his appointment.  On his last visit we tried Remeron higher dose but he complained of increased anxiety and he was recommended to go back on 15 mg.  He is feeling better.  He continues to take Valium as needed.  Patient reported that he was doing better for a long time and he was not taking any psychotropic medication.  He wants to come off from Remeron in the future.  At this time he is taking Remeron 15 mg without any side effects.  He is sleeping better.  He denies any major panic attack in recent weeks.  He continues to socialize very well.  He denies any irritability, anger or any suicidal thoughts.  He continues to take Valium on and off.  The patient has habit of taking the medication on his own and sometime he takes Valium and Remeron according to his needs.  He continues to drink alcohol but denies any binge.    Suicidal Ideation: No Plan Formed: No Patient has means to carry out plan: No  Homicidal Ideation: No Plan Formed: No Patient has means to carry out plan: No  Review of Systems: Psychiatric: Agitation: No Hallucination: No Depressed Mood: No Insomnia: No Hypersomnia: No Altered Concentration: No Feels Worthless: No Grandiose Ideas: No Belief In Special Powers: No New/Increased Substance Abuse: No Compulsions: No  Neurologic: Headache: No Seizure: No Paresthesias: No  Past Medical Family, Social History:  Patient is living with his wife.  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  He is very active in his daily life.  He likes biking, hiking.  Patient admitted drinking at age 7.  He claims he is a social drinker but does admit sometime during to calm his anxiety.  He denies any other illegal substance use.  Patient has a mitral valve regurgitation,  supraventricular premature beats, multiple back surgeries, GERD, Gilbert's syndrome, history of prostate cancer, spinal stenosis and elevated blood sugar.  His primary care physician is Dr. Alwyn Ren.   Outpatient Encounter Prescriptions as of 09/30/2013  Medication Sig  . diazepam (VALIUM) 2 MG tablet Take 1 tablet (2 mg total) by mouth as needed for anxiety.  . lansoprazole (PREVACID) 15 MG capsule Take 15 mg by mouth. 1 by mouth 3 x weekly   . mirtazapine (REMERON) 15 MG tablet Take 1 and 1/2 tab at bed time  . Multiple Vitamin (MULTIVITAMIN) capsule Take 1 capsule by mouth daily.    . naproxen sodium (ANAPROX) 220 MG tablet Take 220 mg by mouth 2 (two) times daily with a meal.  . Omega-3 Fatty Acids (FISH OIL) 1000 MG CAPS Take 1,000 mg by mouth 2 (two) times daily.   . [DISCONTINUED] diazepam (VALIUM) 2 MG tablet Take 2 mg by mouth every 6 (six) hours as needed for anxiety.    Past Psychiatric History/Hospitalization(s): Patient denies any history of suicidal attempt or any inpatient psychiatric treatment. He endorse history of anxiety. He tried Effexor in 2012-2013 after his third back surgery.  Patient took for one year and gradually stopped because he does not want to take psychotropic medication. In the past he had tried Prozac which caused him more nervousness and shakes. 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 tried Lexapro and Xanax but patient had shakes. He  was given Valium when he had back surgery by his primary care physician which helps him for episodic anxiety. Patient denies any history of psychosis mania or any hallucination. Patient denies any history of mood swing or anger.  We did try Zoloft but patient stopped due to an increase in anxiety and nervousness. Anxiety: Yes Bipolar Disorder: No Depression: Yes Mania: No Psychosis: No Schizophrenia: No Personality Disorder: No Hospitalization for psychiatric illness: No History of Electroconvulsive Shock Therapy: No Prior  Suicide Attempts: No  Physical Exam: Constitutional:  BP 177/87  Pulse 56  Ht 5\' 9"  (1.753 m)  Wt 164 lb 9.6 oz (74.662 kg)  BMI 24.30 kg/m2  General Appearance: well nourished  Recent Results (from the past 2160 hour(s))  BASIC METABOLIC PANEL     Status: Abnormal   Collection Time    09/24/13 11:25 AM      Result Value Ref Range   Sodium 140  135 - 145 mEq/L   Potassium 4.8  3.5 - 5.1 mEq/L   Chloride 103  96 - 112 mEq/L   CO2 29  19 - 32 mEq/L   Glucose, Bld 109 (*) 70 - 99 mg/dL   BUN 15  6 - 23 mg/dL   Creatinine, Ser 0.8  0.4 - 1.5 mg/dL   Calcium 10.1  8.4 - 10.5 mg/dL   GFR 103.57  >60.00 mL/min  CBC WITH DIFFERENTIAL     Status: None   Collection Time    09/24/13 11:25 AM      Result Value Ref Range   WBC 5.8  4.5 - 10.5 K/uL   RBC 4.89  4.22 - 5.81 Mil/uL   Hemoglobin 15.4  13.0 - 17.0 g/dL   HCT 46.0  39.0 - 52.0 %   MCV 94.0  78.0 - 100.0 fl   MCHC 33.5  30.0 - 36.0 g/dL   RDW 13.0  11.5 - 14.6 %   Platelets 242.0  150.0 - 400.0 K/uL   Neutrophils Relative % 67.8  43.0 - 77.0 %   Lymphocytes Relative 24.0  12.0 - 46.0 %   Monocytes Relative 6.0  3.0 - 12.0 %   Eosinophils Relative 1.5  0.0 - 5.0 %   Basophils Relative 0.7  0.0 - 3.0 %   Neutro Abs 3.9  1.4 - 7.7 K/uL   Lymphs Abs 1.4  0.7 - 4.0 K/uL   Monocytes Absolute 0.3  0.1 - 1.0 K/uL   Eosinophils Absolute 0.1  0.0 - 0.7 K/uL   Basophils Absolute 0.0  0.0 - 0.1 K/uL  HEMOGLOBIN A1C     Status: None   Collection Time    09/24/13 11:25 AM      Result Value Ref Range   Hemoglobin A1C 5.7  4.6 - 6.5 %   Comment: Glycemic Control Guidelines for People with Diabetes:Non Diabetic:  <6%Goal of Therapy: <7%Additional Action Suggested:  >8%   HEPATIC FUNCTION PANEL     Status: None   Collection Time    09/24/13 11:25 AM      Result Value Ref Range   Total Bilirubin 1.2  0.3 - 1.2 mg/dL   Bilirubin, Direct 0.1  0.0 - 0.3 mg/dL   Alkaline Phosphatase 56  39 - 117 U/L   AST 23  0 - 37 U/L   ALT 22  0  - 53 U/L   Total Protein 7.3  6.0 - 8.3 g/dL   Albumin 4.4  3.5 - 5.2 g/dL  LIPID PANEL  Status: Abnormal   Collection Time    09/24/13 11:25 AM      Result Value Ref Range   Cholesterol 224 (*) 0 - 200 mg/dL   Comment: ATP III Classification       Desirable:  < 200 mg/dL               Borderline High:  200 - 239 mg/dL          High:  > = 240 mg/dL   Triglycerides 80.0  0.0 - 149.0 mg/dL   Comment: Normal:  <150 mg/dLBorderline High:  150 - 199 mg/dL   HDL 88.40  >39.00 mg/dL   VLDL 16.0  0.0 - 40.0 mg/dL   Total CHOL/HDL Ratio 3     Comment:                Men          Women1/2 Average Risk     3.4          3.3Average Risk          5.0          4.42X Average Risk          9.6          7.13X Average Risk          15.0          11.0                      TSH     Status: None   Collection Time    09/24/13 11:25 AM      Result Value Ref Range   TSH 0.96  0.35 - 5.50 uIU/mL  LDL CHOLESTEROL, DIRECT     Status: None   Collection Time    09/24/13 11:25 AM      Result Value Ref Range   Direct LDL 119.9     Comment: Optimal:  <100 mg/dLNear or Above Optimal:  100-129 mg/dLBorderline High:  130-159 mg/dLHigh:  160-189 mg/dLVery High:  >190 mg/dL   Musculoskeletal: Strength & Muscle Tone: within normal limits Gait & Station: normal Patient leans: N/A  Psychiatric: Speech (describe rate, volume, coherence, spontaneity, and abnormalities if any): Clear and coherent.  Thought Process (describe rate, content, abstract reasoning, and computation): No flight of ideas or any loose association.  Associations: Intact  Thoughts: normal  Mental Status: Orientation: oriented to person, place, time/date and situation Mood & Affect: anxiety Attention Span & Concentration: Good  Medical Decision Making (Choose Three): Review of Psycho-Social Stressors (1), Review of Last Therapy Session (1), Review of Medication Regimen & Side Effects (2) and Review of New Medication or Change in Dosage  (2)  Assessment: Axis I: Anxiety disorder NOS  Axis II: Deferred  Axis III: See medical history  Axis IV: Mild to moderate  Axis V: 70-75   Plan:  Reassurance given.  Recommend to decrease Remeron 7.5 mg and use Valium as needed.  I suggested to stop the Remeron after a few weeks if he does not want to take the medication however if symptoms relapse and he needs to call us back immediately start the medication again.  I will see him again in 2 months.  Recommend to call us back if he has any question of any concern.  Maud Rubendall T., MD 09/30/2013

## 2013-10-12 ENCOUNTER — Ambulatory Visit (INDEPENDENT_AMBULATORY_CARE_PROVIDER_SITE_OTHER): Payer: Medicare FFS | Admitting: Psychology

## 2013-10-12 DIAGNOSIS — F411 Generalized anxiety disorder: Secondary | ICD-10-CM

## 2013-10-29 ENCOUNTER — Other Ambulatory Visit (HOSPITAL_COMMUNITY): Payer: Self-pay | Admitting: Neurosurgery

## 2013-10-29 ENCOUNTER — Ambulatory Visit (HOSPITAL_COMMUNITY)
Admission: RE | Admit: 2013-10-29 | Discharge: 2013-10-29 | Disposition: A | Discharge status: Home / Self Care | Payer: Medicare FFS | Source: Ambulatory Visit | Attending: Neurosurgery | Admitting: Neurosurgery

## 2013-10-29 DIAGNOSIS — R52 Pain, unspecified: Secondary | ICD-10-CM

## 2013-10-29 DIAGNOSIS — M47817 Spondylosis without myelopathy or radiculopathy, lumbosacral region: Secondary | ICD-10-CM | POA: Insufficient documentation

## 2013-10-29 DIAGNOSIS — Z981 Arthrodesis status: Secondary | ICD-10-CM | POA: Insufficient documentation

## 2013-10-29 DIAGNOSIS — IMO0001 Reserved for inherently not codable concepts without codable children: Secondary | ICD-10-CM | POA: Insufficient documentation

## 2013-11-10 ENCOUNTER — Telehealth (HOSPITAL_COMMUNITY): Payer: Self-pay | Admitting: *Deleted

## 2013-11-10 NOTE — Telephone Encounter (Signed)
Patient left VM 3/31 @ 1712--VM recv'd 4/1 @ 1010:Needs suggestions regarding possible changes in medications.

## 2013-11-15 ENCOUNTER — Ambulatory Visit (INDEPENDENT_AMBULATORY_CARE_PROVIDER_SITE_OTHER): Payer: Medicare FFS | Admitting: Psychology

## 2013-11-15 DIAGNOSIS — F411 Generalized anxiety disorder: Secondary | ICD-10-CM

## 2013-11-29 ENCOUNTER — Encounter (HOSPITAL_COMMUNITY): Payer: Self-pay | Admitting: Psychiatry

## 2013-11-29 ENCOUNTER — Ambulatory Visit (INDEPENDENT_AMBULATORY_CARE_PROVIDER_SITE_OTHER): Payer: Medicare FFS | Admitting: Psychiatry

## 2013-11-29 VITALS — BP 136/75 | HR 56 | Ht 69.0 in | Wt 161.4 lb

## 2013-11-29 DIAGNOSIS — F411 Generalized anxiety disorder: Secondary | ICD-10-CM

## 2013-11-29 DIAGNOSIS — F419 Anxiety disorder, unspecified: Secondary | ICD-10-CM

## 2013-11-29 MED ORDER — MIRTAZAPINE 7.5 MG PO TABS: ORAL_TABLET | ORAL | Status: DC

## 2013-11-29 NOTE — Progress Notes (Signed)
[place] [phone] Progress Note  Edward Mccall [id number] 68 y.o.   11/29/2013 4:06 PM  Chief Complaint:    I'm taking Remeron 7.5 mg which is helping with anxiety.    History of Present Illness: Edward Mccall came for his appointment.   He is taking Remeron 7.5 mg at bedtime.  He tried to lower dose and even stop the medicine however his anxiety come back.  The patient does not want any psychotropic medication but realized that once he stopped the medication his anxiety comes back.  Recently he is seeing Orion Modest for counseling .  In the past he had tried multiple providers but he is not happy with them.  He wants to try new therapist this time.  He was seeing Dr. Octavia Bruckner but did not like they have.  The patient continues to engage in socialization, volunteer work, biking.  He denies any major panic attack.  He is taking Valium only if he needed. He continues to drink alcohol but denies any binge.    Suicidal Ideation: No Plan Formed: No Patient has means to carry out plan: No  Homicidal Ideation: No Plan Formed: No Patient has means to carry out plan: No  Review of Systems: Psychiatric: Agitation: No Hallucination: No Depressed Mood: No Insomnia: No Hypersomnia: No Altered Concentration: No Feels Worthless: No Grandiose Ideas: No Belief In Special Powers: No New/Increased Substance Abuse: No Compulsions: No  Neurologic: Headache: No Seizure: No Paresthesias: No  Past Medical Family, Social History:  Patient is living with his wife.  He's working as a Retail buyer.  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  He is very active in his daily life.  He likes biking, hiking.  Patient admitted drinking at age 1.  He claims he is a social drinker but does admit sometime during to calm his anxiety.  He denies any other illegal substance use.  Patient has a mitral valve regurgitation, supraventricular premature beats, multiple back surgeries, GERD, Gilbert's syndrome, history of prostate  cancer, spinal stenosis and elevated blood sugar.  His primary care physician is Dr. Linna Darner.   Outpatient Encounter Prescriptions as of 11/29/2013  Medication Sig  . diazepam (VALIUM) 2 MG tablet Take 1 tablet (2 mg total) by mouth as needed for anxiety.  . lansoprazole (PREVACID) 15 MG capsule Take 15 mg by mouth. 1 by mouth 3 x weekly   . mirtazapine (REMERON) 7.5 MG tablet Take 1 bed time  . Multiple Vitamin (MULTIVITAMIN) capsule Take 1 capsule by mouth daily.    . naproxen sodium (ANAPROX) 220 MG tablet Take 220 mg by mouth 2 (two) times daily with a meal.  . Omega-3 Fatty Acids (FISH OIL) 1000 MG CAPS Take 1,000 mg by mouth 2 (two) times daily.   . [DISCONTINUED] mirtazapine (REMERON) 15 MG tablet Take 1 and 1/2 tab at bed time    Past Psychiatric History/Hospitalization(s): Patient denies any history of suicidal attempt or any inpatient psychiatric treatment. He endorse history of anxiety. He tried Effexor in 2012-2013 after his third back surgery.  Patient took for one year and gradually stopped because he does not want to take psychotropic medication. In the past he had tried Prozac which caused him more nervousness and shakes. Last year [name] Child psychotherapist and Dr. Jeb Levering tried Lexapro and Xanax but patient had shakes. He was given Valium when he had back surgery by his primary care physician which helps him for episodic anxiety. Patient denies any history of psychosis mania or any hallucination. Patient denies any  history of mood swing or anger.  We did try Zoloft but patient stopped due to an increase in anxiety and nervousness. Anxiety: Yes Bipolar Disorder: No Depression: Yes Mania: No Psychosis: No Schizophrenia: No Personality Disorder: No Hospitalization for psychiatric illness: No History of Electroconvulsive Shock Therapy: No Prior Suicide Attempts: No  Physical Exam: Constitutional:  BP 136/75  Pulse 56  Ht 5\' 9"  (1.753 m)  Wt 161 lb 6.4 oz (73.211 kg)  BMI 23.82  kg/m2  General Appearance: well nourished  Recent Results (from the past 2160 hour(s))  BASIC METABOLIC PANEL     Status: Abnormal   Collection Time    09/24/13 11:25 AM      Result Value Ref Range   Sodium 140  135 - 145 mEq/L   Potassium 4.8  3.5 - 5.1 mEq/L   Chloride 103  96 - 112 mEq/L   CO2 29  19 - 32 mEq/L   Glucose, Bld 109 (*) 70 - 99 mg/dL   BUN 15  6 - 23 mg/dL   Creatinine, Ser 0.8  0.4 - 1.5 mg/dL   Calcium 10.1  8.4 - 10.5 mg/dL   GFR 103.57  >60.00 mL/min  CBC WITH DIFFERENTIAL     Status: None   Collection Time    09/24/13 11:25 AM      Result Value Ref Range   WBC 5.8  4.5 - 10.5 K/uL   RBC 4.89  4.22 - 5.81 Mil/uL   Hemoglobin 15.4  13.0 - 17.0 g/dL   HCT 46.0  39.0 - 52.0 %   MCV 94.0  78.0 - 100.0 fl   MCHC 33.5  30.0 - 36.0 g/dL   RDW 13.0  11.5 - 14.6 %   Platelets 242.0  150.0 - 400.0 K/uL   Neutrophils Relative % 67.8  43.0 - 77.0 %   Lymphocytes Relative 24.0  12.0 - 46.0 %   Monocytes Relative 6.0  3.0 - 12.0 %   Eosinophils Relative 1.5  0.0 - 5.0 %   Basophils Relative 0.7  0.0 - 3.0 %   Neutro Abs 3.9  1.4 - 7.7 K/uL   Lymphs Abs 1.4  0.7 - 4.0 K/uL   Monocytes Absolute 0.3  0.1 - 1.0 K/uL   Eosinophils Absolute 0.1  0.0 - 0.7 K/uL   Basophils Absolute 0.0  0.0 - 0.1 K/uL  HEMOGLOBIN A1C     Status: None   Collection Time    09/24/13 11:25 AM      Result Value Ref Range   Hemoglobin A1C 5.7  4.6 - 6.5 %   Comment: Glycemic Control Guidelines for People with Diabetes:Non Diabetic:  <6%Goal of Therapy: <7%Additional Action Suggested:  >8%   HEPATIC FUNCTION PANEL     Status: None   Collection Time    09/24/13 11:25 AM      Result Value Ref Range   Total Bilirubin 1.2  0.3 - 1.2 mg/dL   Bilirubin, Direct 0.1  0.0 - 0.3 mg/dL   Alkaline Phosphatase 56  39 - 117 U/L   AST 23  0 - 37 U/L   ALT 22  0 - 53 U/L   Total Protein 7.3  6.0 - 8.3 g/dL   Albumin 4.4  3.5 - 5.2 g/dL  LIPID PANEL     Status: Abnormal   Collection Time     09/24/13 11:25 AM      Result Value Ref Range   Cholesterol 224 (*) 0 - 200 mg/dL  Comment: ATP III Classification       Desirable:  < 200 mg/dL               Borderline High:  200 - 239 mg/dL          High:  > = 240 mg/dL   Triglycerides 80.0  0.0 - 149.0 mg/dL   Comment: Normal:  <150 mg/dLBorderline High:  150 - 199 mg/dL   HDL 88.40  >39.00 mg/dL   VLDL 16.0  0.0 - 40.0 mg/dL   Total CHOL/HDL Ratio 3     Comment:                Men          Women1/2 Average Risk     3.4          3.3Average Risk          5.0          4.42X Average Risk          9.6          7.13X Average Risk          15.0          11.0                      TSH     Status: None   Collection Time    09/24/13 11:25 AM      Result Value Ref Range   TSH 0.96  0.35 - 5.50 uIU/mL  LDL CHOLESTEROL, DIRECT     Status: None   Collection Time    09/24/13 11:25 AM      Result Value Ref Range   Direct LDL 119.9     Comment: Optimal:  <100 mg/dLNear or Above Optimal:  100-129 mg/dLBorderline High:  130-159 mg/dLHigh:  160-189 mg/dLVery High:  >190 mg/dL   Musculoskeletal: Strength & Muscle Tone: within normal limits Gait & Station: normal Patient leans: N/A  Psychiatric: Speech (describe rate, volume, coherence, spontaneity, and abnormalities if any): Clear and coherent.  Thought Process (describe rate, content, abstract reasoning, and computation): No flight of ideas or any loose association.  Associations: Intact  Thoughts: normal  Mental Status: Orientation: oriented to person, place, time/date and situation Mood & Affect: anxiety Attention Span & Concentration: Good  Review of Psycho-Social Stressors (1), Review of Last Therapy Session (1), Review of Medication Regimen & Side Effects (2) and Review of New Medication or Change in Dosage (2)  Assessment: Axis I: Anxiety disorder NOS  Axis II: Deferred  Axis III: See medical history  Axis IV: Mild to moderate  Axis V: 70-75   Plan:  Reassurance given.    recommended to stay Remeron 7.5 mg since it is working very well for him.  Discussed that to change her self medicate the medication.  Continue Valium only as needed.  Vision like to try a new therapist however I suggested if it does not work then give [place] a call back we can schedule with Joni in this office for counseling.  Recommended to call us back if he has any question or a concern.  Followup in 3 months.     ARFEEN,SYED T., MD 11/29/2013

## 2014-02-28 ENCOUNTER — Ambulatory Visit (INDEPENDENT_AMBULATORY_CARE_PROVIDER_SITE_OTHER): Payer: Medicare FFS | Admitting: Psychiatry

## 2014-02-28 ENCOUNTER — Encounter (HOSPITAL_COMMUNITY): Payer: Self-pay | Admitting: Psychiatry

## 2014-02-28 VITALS — BP 146/74 | HR 56 | Ht 70.0 in | Wt 160.0 lb

## 2014-02-28 DIAGNOSIS — F411 Generalized anxiety disorder: Secondary | ICD-10-CM

## 2014-02-28 MED ORDER — MIRTAZAPINE 30 MG PO TABS: ORAL_TABLET | ORAL | Status: DC

## 2014-02-28 MED ORDER — LORAZEPAM 0.5 MG PO TABS: 0.5000 mg | ORAL_TABLET | Freq: Every day | ORAL | Status: DC | PRN

## 2014-02-28 NOTE — Progress Notes (Signed)
[place] [phone] Progress Note  URIYAH Mccall [id number] 68 y.o.   02/28/2014 4:22 PM  Chief Complaint:  My anxiety is under control.  I'm taking Remeron 7.5 mg at bedtime.      History of Present Illness: Ivan came for his appointment.   He is taking Remeron 7.5 mg at bedtime.  He also endorsed that sometime he takes Ativan which is prescribed by his previous provider.  I question if he is taking Valium also patient applied that sometime he takes Valium and sometime he takes Ativan.  However he minimizes the symptoms of anxiety and endorsed that Remeron is working very well and at some point he wants to come off on the medication.  I also reviewed the records from her therapist Dillard Cannon.  Her therapist has strongly recommended to take the higher dose of Remeron or try Effexor again but patient is refusing to go back on Effexor.  He wants to come off from psychotropic medication at some point.  He continues to engage in socialization, volunteer work, biking and regular exercise.  Denies any major panic attack but he admitted sometime nervousness and anxiety and there are times when he has difficulty sleeping.  He denies any crying spells, feelings of hopelessness or worthlessness.  Denies any tremors or shakes.  He continues to drink on and off but denies any binge drinking.  He is not using any illegal substances.  He agreed to continue Remeron 7.5 mg at bedtime.  He is reluctant to increase the dose.  However he wants to use Ativan for severe panic attack and anxiety symptoms.  His appetite is okay.  His vitals are stable.  He lives with his wife.  Suicidal Ideation: No Plan Formed: No Patient has means to carry out plan: No  Homicidal Ideation: No Plan Formed: No Patient has means to carry out plan: No  Review of Systems: Psychiatric: Agitation: No Hallucination: No Depressed Mood: No Insomnia: No Hypersomnia: No Altered Concentration: No Feels Worthless:  No Grandiose Ideas: No Belief In Special Powers: No New/Increased Substance Abuse: No Compulsions: No  Neurologic: Headache: No Seizure: No Paresthesias: No  Medical History:  Patient has a mitral valve regurgitation, supraventricular premature beats, multiple back surgeries, GERD, Gilbert's syndrome, history of prostate cancer, spinal stenosis and elevated blood sugar.  His primary care physician is Dr. Linna Darner.   Outpatient Encounter Prescriptions as of 02/28/2014  Medication Sig  . LORazepam (ATIVAN) 0.5 MG tablet Take 1 tablet (0.5 mg total) by mouth daily as needed for anxiety.  . Multiple Vitamin (MULTIVITAMIN) capsule Take 1 capsule by mouth daily.    . Omega-3 Fatty Acids (FISH OIL) 1000 MG CAPS Take 1,000 mg by mouth 2 (two) times daily.   . [DISCONTINUED] LORazepam (ATIVAN) 1 MG tablet   . [DISCONTINUED] mirtazapine (REMERON) 7.5 MG tablet Take 1 bed time  . mirtazapine (REMERON) 30 MG tablet Take 1/2 tab at bed time  . [DISCONTINUED] diazepam (VALIUM) 2 MG tablet Take 1 tablet (2 mg total) by mouth as needed for anxiety.  . [DISCONTINUED] lansoprazole (PREVACID) 15 MG capsule Take 15 mg by mouth. 1 by mouth 3 x weekly   . [DISCONTINUED] naproxen sodium (ANAPROX) 220 MG tablet Take 220 mg by mouth 2 (two) times daily with a meal.    Past Psychiatric History/Hospitalization(s): Patient had tried Effexor, Prozac, Lexapro, Xanax and Zoloft in the past. Anxiety: Yes Bipolar Disorder: No Depression: Yes Mania: No Psychosis: No Schizophrenia: No Personality Disorder: No  Hospitalization for psychiatric illness: No History of Electroconvulsive Shock Therapy: No Prior Suicide Attempts: No  Physical Exam: Constitutional:  BP 146/74  Pulse 56  Ht 5\' 10"  (1.778 m)  Wt 160 lb (72.576 kg)  BMI 22.96 kg/m2  General Appearance: well nourished  No results found for this or any previous visit (from the past 2160 hour(s)). Musculoskeletal: Strength & Muscle Tone: within  normal limits Gait & Station: normal Patient leans: N/A  Psychiatric: Speech (describe rate, volume, coherence, spontaneity, and abnormalities if any): Clear and coherent.  Thought Process (describe rate, content, abstract reasoning, and computation): No flight of ideas or any loose association.  Associations: Intact  Thoughts: normal  Mental Status: Orientation: oriented to person, place, time/date and situation Mood & Affect: anxiety Attention Span & Concentration: Good  Review of Psycho-Social Stressors (1), Review and summation of old records (2), Review of Last Therapy Session (1), Review of Medication Regimen & Side Effects (2) and Review of New Medication or Change in Dosage (2)  Assessment: Axis I: Anxiety disorder NOS  Axis II: Deferred  Axis III: See medical history  Axis IV: Mild to moderate  Axis V: 70-75   Plan:  Reassurance given.  Recommended to continue only one benzodiazepine for when necessary.  Patient preferred Ativan.  I will discontinue Valium.  Recommended to continue Remeron 7.5 mg since it is working very well.  Patient is reluctant to increase the dose and he is hoping to come off from the medication at some point.  Patient does not have any side effects of Remeron 7.5 milligram.  He is using Ativan for severe panic attack.  I will provide 0.5 mg #30 tablet with no refills and continue Remeron 7.5 mg every night with 2 additional refills.  Recommended to see her therapist on a regular basis.  Recommended to call us back if he has any question or any concern.  Followup in 3 months.  Time spent 25 minutes.  More than 50% of the time spent in psychoeducation, counseling and coordination of care.  Discuss safety plan that anytime having active suicidal thoughts or homicidal thoughts then patient need to call 911 or go to the local emergency room.   Demani Mcbrien T., MD 02/28/2014

## 2014-03-29 ENCOUNTER — Encounter: Payer: Self-pay | Admitting: Internal Medicine

## 2014-05-13 ENCOUNTER — Encounter (HOSPITAL_COMMUNITY): Payer: Self-pay | Admitting: Psychiatry

## 2014-05-13 ENCOUNTER — Ambulatory Visit (INDEPENDENT_AMBULATORY_CARE_PROVIDER_SITE_OTHER): Payer: Medicare FFS | Admitting: Psychiatry

## 2014-05-13 VITALS — BP 145/82 | HR 56 | Wt 156.0 lb

## 2014-05-13 DIAGNOSIS — F411 Generalized anxiety disorder: Secondary | ICD-10-CM

## 2014-05-13 DIAGNOSIS — F419 Anxiety disorder, unspecified: Secondary | ICD-10-CM

## 2014-05-13 MED ORDER — MIRTAZAPINE 15 MG PO TABS: ORAL_TABLET | ORAL | Status: DC

## 2014-05-13 NOTE — Progress Notes (Signed)
[name] [phone] Progress Note  Edward Mccall [id number] 68 y.o.   05/13/2014 11:39 AM  Chief Complaint:  I want to try Valium.  I think Ativan is making me depressed.      History of Present Illness: Edward Mccall came earlier than his schedule appointment.  He is complaining of increased anxiety and nervousness.  He is taking Remeron 15 mg.  He wants to go back on 7.5 mg.  He feels Ativan is making him more sad and depressed.  He admitted some time poor sleep and nervous.  The patient minimizes his chronic illness despite rehabilitation discussion that his anxiety is chronic in nature.  He had self medicate and adjust his own medication dosage in the past.  He does not want psychotropic medication but realized that he has no other choice.  He denies any crying spells or feeling of hopelessness but endorsed some time he has nervousness and panic attack.  He is seeing therapist Edward Mccall who also recommended to take higher dose of antianxiety medication.  Patient continues to drink alcohol however he has cut down from the past.  His appetite is okay.  His vitals are stable.  He denies any hallucination or any paranoia.  His appetite is okay.  His vitals are stable.  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  He lives with his wife.  Suicidal Ideation: No Plan Formed: No Patient has means to carry out plan: No  Homicidal Ideation: No Plan Formed: No Patient has means to carry out plan: No  Review of Systems  Constitutional: Negative.   Respiratory: Negative.   Skin: Negative.   Neurological: Negative.   Psychiatric/Behavioral: Negative for suicidal ideas and hallucinations. The patient has insomnia.        Anxiety   Psychiatric: Agitation: No Hallucination: No Depressed Mood: No Insomnia: No Hypersomnia: No Altered Concentration: No Feels Worthless: No Grandiose Ideas: No Belief In Special Powers: No New/Increased Substance Abuse:  No Compulsions: No  Neurologic: Headache: No Seizure: No Paresthesias: No  Medical History:  Patient has a mitral valve regurgitation, supraventricular premature beats, multiple back surgeries, GERD, Gilbert's syndrome, history of prostate cancer, spinal stenosis and elevated blood sugar.  His primary care physician is Dr. Linna Darner.   Outpatient Encounter Prescriptions as of 05/13/2014  Medication Sig  . diazepam (VALIUM) 2 MG tablet Take 2 mg by mouth as needed for anxiety.  . mirtazapine (REMERON) 15 MG tablet Take 1/2 tab at bed time  . Multiple Vitamin (MULTIVITAMIN) capsule Take 1 capsule by mouth daily.    . Omega-3 Fatty Acids (FISH OIL) 1000 MG CAPS Take 1,000 mg by mouth 2 (two) times daily.   . [DISCONTINUED] LORazepam (ATIVAN) 0.5 MG tablet Take 1 tablet (0.5 mg total) by mouth daily as needed for anxiety.  . [DISCONTINUED] mirtazapine (REMERON) 30 MG tablet Take 1/2 tab at bed time    Past Psychiatric History/Hospitalization(s): Patient had tried Effexor, Prozac, Lexapro, Xanax and Zoloft in the past. Anxiety: Yes Bipolar Disorder: No Depression: Yes Mania: No Psychosis: No Schizophrenia: No Personality Disorder: No Hospitalization for psychiatric illness: No History of Electroconvulsive Shock Therapy: No Prior Suicide Attempts: No  Physical Exam: Constitutional:  BP 145/82  Pulse 56  Wt 156 lb (70.761 kg)  General Appearance: well nourished  No results found for this or any previous visit (from the past 2160 hour(s)). Musculoskeletal: Strength & Muscle Tone: within normal limits Gait & Station: normal Patient leans: N/A  Psychiatric: Speech (describe rate, volume, coherence, spontaneity, and abnormalities if any): Clear and coherent.  Thought Process (describe rate, content, abstract reasoning, and computation): No flight of ideas or any loose association.  Associations: Intact  Thoughts: rumination but delusion, paranoia .    Mental  Status: Orientation: oriented to person, place, time/date and situation Mood & Affect: anxiety Attention Span & Concentration: Good  Review of Psycho-Social Stressors (1), Review and summation of old records (2), Review of Last Therapy Session (1), Review of Medication Regimen & Side Effects (2) and Review of New Medication or Change in Dosage (2)  Assessment: Axis I: Anxiety disorder NOS  Axis II: Deferred  Axis III: See medical history  Axis IV: Mild to moderate  Axis V: 70-75   Plan:  Reassurance given.  As per his request we will discontinue Ativan .  Patient has refill remaining on 2 mg Valium which he has been taking in the past.  Patient wants to use 2 mg Valium as needed.  Reduce Remeron 7.5 mg .  I also discussed to try Cymbalta /Prestiq as patient has not tried these medications before.  Patient is admitted reluctant to try any new medication at this time.  Recommended to see her therapist on a regular basis.  Recommended to call us back if he has any question or any concern.  Followup in 3 months.  Time spent 25 minutes.  More than 50% of the time spent in psychoeducation, counseling and coordination of care.  Discuss safety plan that anytime having active suicidal thoughts or homicidal thoughts then patient need to call 911 or go to the local emergency room.   Massie Mees T., MD 05/13/2014

## 2014-05-25 ENCOUNTER — Telehealth: Payer: Self-pay | Admitting: Family Medicine

## 2014-05-30 ENCOUNTER — Ambulatory Visit (HOSPITAL_COMMUNITY): Payer: Self-pay | Admitting: Psychiatry

## 2014-06-01 NOTE — Telephone Encounter (Signed)
Okay to see whenever he can be scheduled

## 2014-07-12 ENCOUNTER — Encounter: Payer: Self-pay | Admitting: Internal Medicine

## 2014-07-19 ENCOUNTER — Telehealth: Payer: Self-pay | Admitting: Gastroenterology

## 2014-07-19 ENCOUNTER — Telehealth: Payer: Self-pay | Admitting: Internal Medicine

## 2014-07-19 NOTE — Telephone Encounter (Signed)
Rec'd from [facility] forward 2 pages to Dr. Deatra Ina

## 2014-07-19 NOTE — Telephone Encounter (Signed)
Rec'd from [facility] forward 2 pages to Dr.Hopper

## 2014-07-22 ENCOUNTER — Encounter: Payer: Self-pay | Admitting: Gastroenterology

## 2014-08-15 ENCOUNTER — Ambulatory Visit (HOSPITAL_COMMUNITY): Payer: Self-pay | Admitting: Psychiatry

## 2014-08-22 ENCOUNTER — Encounter (HOSPITAL_COMMUNITY): Payer: Self-pay | Admitting: Psychiatry

## 2014-08-22 ENCOUNTER — Ambulatory Visit (INDEPENDENT_AMBULATORY_CARE_PROVIDER_SITE_OTHER): Payer: Medicare FFS | Admitting: Psychiatry

## 2014-08-22 VITALS — BP 135/80 | HR 61 | Ht 70.0 in | Wt 158.8 lb

## 2014-08-22 DIAGNOSIS — F411 Generalized anxiety disorder: Secondary | ICD-10-CM

## 2014-08-22 DIAGNOSIS — F419 Anxiety disorder, unspecified: Secondary | ICD-10-CM

## 2014-08-22 MED ORDER — DIAZEPAM 2 MG PO TABS: 2.0000 mg | ORAL_TABLET | ORAL | Status: DC | PRN

## 2014-08-22 MED ORDER — MIRTAZAPINE 15 MG PO TABS: ORAL_TABLET | ORAL | Status: DC

## 2014-08-22 NOTE — Progress Notes (Signed)
[place] [phone] Progress Note  Edward Mccall [id number] 69 y.o.   08/22/2014 4:17 PM  Chief Complaint:  Medication management and follow-up.       History of Present Illness: Edward Mccall came for his follow-up appointment.  He had a good Christmas.  His daughter from [place] visited him and he had a good time.  Patient is taking Remeron 7.5 mg every day and he also takes Valium 2 mg as needed.  His energy level is good.  He continues to have moments of anxiety and nervousness but it has been less intense and less frequent from the past.  He mentioned that he wished that he does not have to take any medication but he realized that he may need low-dose Remeron to prevent any anxiety attack.  He continues to engage himself in excessive exercise and biking.  Patient denies any crying spells or any feeling of hopelessness or worthlessness.  He is interested to do CBT and requesting some referrals.  We have scheduled him with a therapist in the past however he felt no one did CBT.  Patient denies any irritability or any anger.  His appetite is okay.  His vitals are stable.  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  He lives with his wife.  Suicidal Ideation: No Plan Formed: No Patient has means to carry out plan: No  Homicidal Ideation: No Plan Formed: No Patient has means to carry out plan: No  ROS Psychiatric: Agitation: No Hallucination: No Depressed Mood: No Insomnia: No Hypersomnia: No Altered Concentration: No Feels Worthless: No Grandiose Ideas: No Belief In Special Powers: No New/Increased Substance Abuse: No Compulsions: No  Neurologic: Headache: No Seizure: No Paresthesias: No  Medical History:  Patient has a mitral valve regurgitation, supraventricular premature beats, multiple back surgeries, GERD, Gilbert's syndrome, history of prostate cancer, spinal stenosis and elevated blood sugar.  His primary care physician is Dr.  Linna Darner.   Outpatient Encounter Prescriptions as of 08/22/2014  Medication Sig  . diazepam (VALIUM) 2 MG tablet Take 1 tablet (2 mg total) by mouth as needed for anxiety.  . mirtazapine (REMERON) 15 MG tablet Take 1/2 tab at bed time  . Multiple Vitamin (MULTIVITAMIN) capsule Take 1 capsule by mouth daily.    . Omega-3 Fatty Acids (FISH OIL) 1000 MG CAPS Take 1,000 mg by mouth 2 (two) times daily.   . [DISCONTINUED] diazepam (VALIUM) 2 MG tablet Take 2 mg by mouth as needed for anxiety.  . [DISCONTINUED] mirtazapine (REMERON) 15 MG tablet Take 1/2 tab at bed time    Past Psychiatric History/Hospitalization(s): Patient had tried Effexor, Prozac, Lexapro, Xanax and Zoloft in the past. Anxiety: Yes Bipolar Disorder: No Depression: Yes Mania: No Psychosis: No Schizophrenia: No Personality Disorder: No Hospitalization for psychiatric illness: No History of Electroconvulsive Shock Therapy: No Prior Suicide Attempts: No  Physical Exam: Constitutional:  BP 135/80 mmHg  Pulse 61  Ht 5\' 10"  (1.778 m)  Wt 158 lb 12.8 oz (72.031 kg)  BMI 22.79 kg/m2  General Appearance: well nourished  No results found for this or any previous visit (from the past 2160 hour(s)). Musculoskeletal: Strength & Muscle Tone: within normal limits Gait & Station: normal Patient leans: N/A  Psychiatric: Speech (describe rate, volume, coherence, spontaneity, and abnormalities if any): Clear and coherent.  Thought Process (describe rate, content, abstract reasoning, and computation): No flight of ideas or any loose association.  Associations: Intact  Thoughts: normal  Mental Status: Orientation: oriented  to person, place, time/date and situation Mood & Affect: anxiety Attention Span & Concentration: Good  Established Problem, Stable/Improving (1), Review of Psycho-Social Stressors (1), Review of Last Therapy Session (1) and Review of Medication Regimen & Side Effects (2)  Assessment: Axis I: Anxiety  disorder NOS  Axis II: Deferred  Axis III: See medical history  Axis IV: Mild   Plan:  Patient overall doing better.  He is taking Remeron 7.5 mg daily and denies any side effects.  He wants to continue Valium 2 mg as needed.  He does not take Valium every day.  He wants to try CBT for his anxiety.  I provided names of Edward Mccall, Edward Mccall and Edward Mccall contact information since patient does not know who will be in his insurance network.  Patient also change his pharmacy and now he is getting prescription at [facility].  A new prescription of Remeron is given along with Valium 2 mg as needed.  Recommended to call us back if he has any question, concern or if he feeling worsening of the symptoms.  I will seen in 3 months.  ARFEEN,SYED T., MD 08/22/2014

## 2014-09-02 ENCOUNTER — Ambulatory Visit: Payer: Self-pay | Admitting: Cardiovascular Disease

## 2014-09-16 ENCOUNTER — Encounter: Payer: Self-pay | Admitting: Gastroenterology

## 2014-09-16 ENCOUNTER — Ambulatory Visit (INDEPENDENT_AMBULATORY_CARE_PROVIDER_SITE_OTHER): Payer: Medicare FFS | Admitting: Gastroenterology

## 2014-09-16 VITALS — BP 108/62 | HR 70 | Ht 70.0 in | Wt 157.6 lb

## 2014-09-16 DIAGNOSIS — Z860100 Personal history of colon polyps, unspecified: Secondary | ICD-10-CM

## 2014-09-16 HISTORY — DX: Personal history of colonic polyps: Z86.010

## 2014-09-16 HISTORY — DX: Personal history of colon polyps, unspecified: Z86.0100

## 2014-09-16 NOTE — Patient Instructions (Signed)
Will we contact you after we receive your records from [facility].

## 2014-09-16 NOTE — Assessment & Plan Note (Signed)
Unless something untoward was seen at his earliest colonoscopy I think the patient can go another 5 years until surveillance colonoscopy.  Plan to obtain his old records for review.

## 2014-09-16 NOTE — Progress Notes (Signed)
_                                                                                                                History of Present Illness:  [name] is a pleasant 69 year old white male with history of colon polyps here for consideration of colonoscopy.  2010 colonoscopy was negative for polyps.  The patient states that a 2005 colonoscopy was normal and that he thinks he had polyps in 2000.  He offers no GI complaints including change of bowel habits, abdominal pain or bleeding.  He has a history of prostate cancer and is undergone 3 back surgeries over the past 3 years.  Family history is negative for colon cancer.   Past Medical History  Diagnosis Date  . 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 MITRAL REGURGITATION 11/15/2008    Mild to moderate by echo 2010  . GERD 05/24/2008  . Anxiety 01/02/2011  . Spinal stenosis 01/02/2011  . Prostate cancer     Dr Alinda Money  . Gynecomastia    Past Surgical History  Procedure Laterality Date  . Lumbar disc surgery  11/2010    L4-5 discectomy & laminectomy, Dr Ellene Route   . Lumbar disc surgery  01/2011    L4-5 discectomy,Dr Elsner  . Lumbar disc surgery  09/2011    L4-5 minimally invasive fusion , Dr Myrene Buddy, [facility]  . Prostatectomy  2011  . Tooth extraction    . Colonoscopy with polypectomy  1997    neg since; Dr [facility]   family history includes Alcohol abuse in his father; Anxiety disorder in his brother, mother, and son; Bladder Cancer in his mother; Diabetes in his mother. There is no history of CAD, Stroke, or Heart disease. Current Outpatient Prescriptions  Medication Sig Dispense Refill  . diazepam (VALIUM) 2 MG tablet Take 1 tablet (2 mg total) by mouth as needed for anxiety. 30 tablet 1  . mirtazapine (REMERON) 15 MG tablet Take 1/2 tab at bed time 15 tablet 2  . Multiple Vitamin (MULTIVITAMIN) capsule Take 1 capsule by mouth daily.      . Omega-3 Fatty Acids (FISH OIL) 1000 MG CAPS Take 1,000 mg by mouth 2 (two)  times daily.     . Probiotic Product (PROBIOTIC DAILY PO) Take 1 capsule by mouth daily.     No current facility-administered medications for this visit.   Allergies as of 09/16/2014 - Review Complete 09/16/2014  Allergen Reaction Noted  . Zoloft [sertraline hcl]  09/24/2013    reports that he quit smoking about 41 years ago. His smoking use included Cigarettes. He has a 10 pack-year smoking history. He does not have any smokeless tobacco history on file. He reports that he drinks about 4.2 oz of alcohol per week. He reports that he does not use illicit drugs.   Review of Systems: Pertinent positive and negative review of systems were noted in the above HPI section. All other review of systems were otherwise negative.  Vital signs were reviewed in today's  medical record Physical Exam: General: Well developed , well nourished, no acute distress Skin: anicteric Head: Normocephalic and atraumatic Eyes:  sclerae anicteric, EOMI Ears: Normal auditory acuity Mouth: No deformity or lesions Neck: Supple, no masses or thyromegaly Lungs: Clear throughout to auscultation Heart: Regular rate and rhythm; no murmurs, rubs or bruits Abdomen: Soft, non tender and non distended. No masses, hepatosplenomegaly or hernias noted. Normal Bowel sounds Rectal:deferred Musculoskeletal: Symmetrical with no gross deformities  Skin: No lesions on visible extremities Pulses:  Normal pulses noted Extremities: No clubbing, cyanosis, edema or deformities noted Neurological: Alert oriented x 4, grossly nonfocal Cervical Nodes:  No significant cervical adenopathy Inguinal Nodes: No significant inguinal adenopathy Psychological:  Alert and cooperative. Normal mood and affect  See Assessment and Plan under Problem List

## 2014-09-20 ENCOUNTER — Telehealth: Payer: Self-pay | Admitting: Gastroenterology

## 2014-09-20 NOTE — Telephone Encounter (Signed)
Rec'd from [facility] forward to Dr. Deatra Ina

## 2014-09-21 ENCOUNTER — Encounter: Payer: Self-pay | Admitting: Cardiovascular Disease

## 2014-09-30 ENCOUNTER — Ambulatory Visit (INDEPENDENT_AMBULATORY_CARE_PROVIDER_SITE_OTHER): Payer: Medicare FFS | Admitting: Cardiovascular Disease

## 2014-09-30 ENCOUNTER — Other Ambulatory Visit (INDEPENDENT_AMBULATORY_CARE_PROVIDER_SITE_OTHER): Payer: Medicare FFS

## 2014-09-30 ENCOUNTER — Ambulatory Visit (INDEPENDENT_AMBULATORY_CARE_PROVIDER_SITE_OTHER): Payer: Medicare FFS | Admitting: Internal Medicine

## 2014-09-30 ENCOUNTER — Encounter: Payer: Self-pay | Admitting: Internal Medicine

## 2014-09-30 ENCOUNTER — Encounter: Payer: Self-pay | Admitting: Cardiovascular Disease

## 2014-09-30 VITALS — BP 122/72 | HR 44 | Ht 70.0 in | Wt 158.0 lb

## 2014-09-30 VITALS — BP 140/86 | HR 59 | Temp 97.9°F | Resp 14 | Ht 70.0 in | Wt 157.5 lb

## 2014-09-30 DIAGNOSIS — I340 Nonrheumatic mitral (valve) insufficiency: Secondary | ICD-10-CM

## 2014-09-30 DIAGNOSIS — R7301 Impaired fasting glucose: Secondary | ICD-10-CM

## 2014-09-30 DIAGNOSIS — E785 Hyperlipidemia, unspecified: Secondary | ICD-10-CM

## 2014-09-30 DIAGNOSIS — K219 Gastro-esophageal reflux disease without esophagitis: Secondary | ICD-10-CM

## 2014-09-30 DIAGNOSIS — R002 Palpitations: Secondary | ICD-10-CM

## 2014-09-30 DIAGNOSIS — R030 Elevated blood-pressure reading, without diagnosis of hypertension: Secondary | ICD-10-CM

## 2014-09-30 DIAGNOSIS — Z86010 Personal history of colonic polyps: Secondary | ICD-10-CM

## 2014-09-30 LAB — HEMOGLOBIN A1C: HEMOGLOBIN A1C: 5.8 % (ref 4.6–6.5)

## 2014-09-30 LAB — CBC WITH DIFFERENTIAL/PLATELET
BASOS PCT: 0.4 % (ref 0.0–3.0)
Basophils Absolute: 0 10*3/uL (ref 0.0–0.1)
EOS PCT: 1.6 % (ref 0.0–5.0)
Eosinophils Absolute: 0.1 10*3/uL (ref 0.0–0.7)
HCT: 43.9 % (ref 39.0–52.0)
HEMOGLOBIN: 15.1 g/dL (ref 13.0–17.0)
LYMPHS PCT: 25.8 % (ref 12.0–46.0)
Lymphs Abs: 1.4 10*3/uL (ref 0.7–4.0)
MCHC: 34.4 g/dL (ref 30.0–36.0)
MCV: 90.8 fl (ref 78.0–100.0)
Monocytes Absolute: 0.4 10*3/uL (ref 0.1–1.0)
Monocytes Relative: 7.4 % (ref 3.0–12.0)
NEUTROS ABS: 3.5 10*3/uL (ref 1.4–7.7)
Neutrophils Relative %: 64.8 % (ref 43.0–77.0)
Platelets: 237 10*3/uL (ref 150.0–400.0)
RBC: 4.84 Mil/uL (ref 4.22–5.81)
RDW: 13 % (ref 11.5–15.5)
WBC: 5.3 10*3/uL (ref 4.0–10.5)

## 2014-09-30 LAB — HEPATIC FUNCTION PANEL
ALBUMIN: 4.4 g/dL (ref 3.5–5.2)
ALK PHOS: 58 U/L (ref 39–117)
ALT: 20 U/L (ref 0–53)
AST: 23 U/L (ref 0–37)
Bilirubin, Direct: 0.2 mg/dL (ref 0.0–0.3)
Total Bilirubin: 1.2 mg/dL (ref 0.2–1.2)
Total Protein: 7.1 g/dL (ref 6.0–8.3)

## 2014-09-30 LAB — BASIC METABOLIC PANEL
BUN: 14 mg/dL (ref 6–23)
CO2: 31 mEq/L (ref 19–32)
Calcium: 9.7 mg/dL (ref 8.4–10.5)
Chloride: 103 mEq/L (ref 96–112)
Creatinine, Ser: 0.88 mg/dL (ref 0.40–1.50)
GFR: 91.17 mL/min (ref >60.00)
GLUCOSE: 114 mg/dL — AB (ref 70–99)
POTASSIUM: 4.1 meq/L (ref 3.5–5.1)
SODIUM: 140 meq/L (ref 135–145)

## 2014-09-30 LAB — TSH: TSH: 0.83 u[IU]/mL (ref 0.35–4.50)

## 2014-09-30 LAB — LIPID PANEL
CHOLESTEROL: 207 mg/dL — AB (ref 0–200)
HDL: 88.1 mg/dL (ref >39.00)
LDL Cholesterol: 106 mg/dL — ABNORMAL HIGH (ref 0–99)
NONHDL: 118.9
Total CHOL/HDL Ratio: 2
Triglycerides: 63 mg/dL (ref 0.0–149.0)
VLDL: 12.6 mg/dL (ref 0.0–40.0)

## 2014-09-30 NOTE — Patient Instructions (Signed)
Your physician wants you to follow-up in: 12 months. You will receive a reminder letter in the mail two months in advance. If you don't receive a letter, please call our office to schedule the follow-up appointment.  Your physician has requested that you have an echocardiogram. Echocardiography is a painless test that uses sound waves to create images of your heart. It provides your doctor with information about the size and shape of your heart and how well your heart's chambers and valves are working. This procedure takes approximately one hour. There are no restrictions for this procedure. To be done in January 2017

## 2014-09-30 NOTE — Assessment & Plan Note (Signed)
Lipids, LFTs, TSH  

## 2014-09-30 NOTE — Patient Instructions (Signed)
Minimal Blood Pressure Goal= AVERAGE < 140/90;  Ideal is an AVERAGE < 135/85. This AVERAGE should be calculated from @ least 5-7 BP readings taken @ different times of day on different days of week. You should not respond to isolated BP readings , but rather the AVERAGE for that week .Please bring your  blood pressure cuff to office visits to verify that it is reliable.It  can also be checked against the blood pressure device at the pharmacy. Finger or wrist cuffs are not dependable; an arm cuff is.  Your next office appointment will be determined based upon review of your pending labs   Those instructions will be transmitted to you through My Chart   Critical values will be called. Followup as needed for any active or acute issue. Please report any significant change in your symptoms.

## 2014-09-30 NOTE — Progress Notes (Addendum)
  History of Present Illness: 68 yo male with history of mitral regurgitation, anxiety and GERD who is here today for cardiac follow up. He has been followed by Dr. Nishan in the past. I met him for the first time January 2014. Echo January 2014 with normal LV size and function, mild MR, mild LVH. Per report, stress myoview without ischemia around 2009.   He tells me today that he has been doing well. He has had some palpitations when he is anxious but this is are. No chest pain or SOB. He is very active and exercises daily. He rode 5000 miles on his bike this year. No dizziness, near syncope or syncope.   Primary Care Physician: Hopper  Past Medical History  Diagnosis Date  . 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 MITRAL REGURGITATION 11/15/2008    Mild to moderate by echo 2010  . GERD 05/24/2008  . Anxiety 01/02/2011  . Spinal stenosis 01/02/2011  . Prostate cancer     Dr Borden  . Gynecomastia   . History of colonic polyps 09/16/2014    Past Surgical History  Procedure Laterality Date  . Lumbar disc surgery  11/2010    L4-5 discectomy & laminectomy, Dr Elsner   . Lumbar disc surgery  01/2011    L4-5 discectomy,Dr Elsner  . Lumbar disc surgery  09/2011    L4-5 minimally invasive fusion , Dr Issacs, [facility]  . Prostatectomy  2011  . Tooth extraction    . Colonoscopy with polypectomy  1997    neg since; Dr Peters, [facility]    Current Outpatient Prescriptions  Medication Sig Dispense Refill  . diazepam (VALIUM) 2 MG tablet Take 1 tablet (2 mg total) by mouth as needed for anxiety. 30 tablet 1  . mirtazapine (REMERON) 15 MG tablet Take 1/2 tab at bed time 15 tablet 2  . Multiple Vitamin (MULTIVITAMIN) capsule Take 1 capsule by mouth daily.      . Omega-3 Fatty Acids (FISH OIL) 1000 MG CAPS Take 1,000 mg by mouth 2 (two) times daily.     . Probiotic Product (PROBIOTIC DAILY PO) Take 1 capsule by mouth daily.     No current facility-administered medications for this visit.     Allergies  Allergen Reactions  . Zoloft [Sertraline Hcl]     "ramped anxiety up"  Same effect with Prozac & Lexapro    History   Social History  . Marital Status: Married    Spouse Name: N/A  . Number of Children: 2  . Years of Education: N/A   Occupational History  . Retired-works part time at [employer]    Social History Main Topics  . Smoking status: Former Smoker -- 1.00 packs/day for 10 years    Types: Cigarettes    Quit date: 08/12/1973  . Smokeless tobacco: Not on file     Comment: smoked 1960-1975, up to 1 ppd  . Alcohol Use: 4.2 oz/week    7 Standard drinks or equivalent per week  . Drug Use: No  . Sexual Activity: Not on file   Other Topics Concern  . Not on file   Social History Narrative    Family History  Problem Relation Age of Onset  . Anxiety disorder Mother   . Diabetes Mother     not definite  . Bladder Cancer Mother   . Anxiety disorder Son   . Anxiety disorder Brother   . Alcohol abuse Father   . CAD Neg Hx   .   Stroke Neg Hx   . Heart disease Neg Hx     Review of Systems:  As stated in the HPI and otherwise negative.   BP 122/72 mmHg  Pulse 44  Ht 5' 10" (1.778 m)  Wt 158 lb (71.668 kg)  BMI 22.67 kg/m2  Physical Examination: General: Well developed, well nourished, NAD HEENT: OP clear, mucus membranes moist SKIN: warm, dry. No rashes. Neuro: No focal deficits Musculoskeletal: Muscle strength 5/5 all ext Psychiatric: Mood and affect normal Neck: No JVD, no carotid bruits, no thyromegaly, no lymphadenopathy. Lungs:Clear bilaterally, no wheezes, rhonci, crackles Cardiovascular: Regular rate and rhythm. Slight systolic murmur. No gallops or rubs. Abdomen:Soft. Bowel sounds present. Non-tender.  Extremities: No lower extremity edema. Pulses are 2 + in the bilateral DP/PT.  EKG: Sinus brady, rate 44 bpm. Incomplete RBBB  Echo January 2014: Left ventricle: The cavity size was normal. Wall thickness was increased in a pattern  of mild LVH. Systolic function was normal. The estimated ejection fraction was in the range of 60% to 65%. Doppler parameters are consistent with abnormal left ventricular relaxation (grade 1 diastolic dysfunction). - Mitral valve: Mild regurgitation.  Assessment and Plan:   1. Mitral regurgitation: Mild by echo January 2014. Repeat echo January 2017.   2. Palpitations: Known to have PACs. Palpitations resolved with use of Ativan. Avoid stimulants and etoh.   Addendum 10/12/14: Pt called our office yesterday with c/o chest pain and fluctuation in blood pressures. He is leaving soon for a trip. He would like to plan a stress test before his trip. We had discussed this at his visit. Will arrange exercise stress myoview to exclude ischemia.   Noboru Bidinger

## 2014-09-30 NOTE — Assessment & Plan Note (Signed)
A1c

## 2014-09-30 NOTE — Assessment & Plan Note (Signed)
CBC & dif  Anti reflux measures 

## 2014-09-30 NOTE — Progress Notes (Signed)
Subjective:    Patient ID: Edward Mccall, male    DOB: 06-25-1946, 69 y.o.   MRN: [id number]  HPI The patient is here to assess status of active health conditions.  PMH, FH, & Social History reviewed & updated.  He is on a heart healthy diet; he does not restrict salt. He exercises a high level V-VI times per week as walking, biking or core exercises without cardiopulmonary symptoms except for palpitations. His palpitations actually get better with exercise.  He does monitor his blood pressure at the gym ;it. has been well controlled and is actually low after exercise. Typically average is 118/72.  He has a history of polyps. He has no active GI symptoms at this time except for minor increase in gas and rare stomachache with triggers such as granola. Colonoscopy is up-to-date.  Review of Systems   Chest pain, tachycardia, exertional dyspnea, paroxysmal nocturnal dyspnea, claudication or edema are absent.  Unexplained weight loss, abdominal pain, significant dyspepsia, dysphagia, melena, rectal bleeding, or persistently small caliber stools are denied.     Objective:   Physical Exam  Pertinent or positive findings include: His head is shaven. He wears a goatee. He has a slow rhythm with a grade 1.5 systolic murmur at the right base.  Gen.: Adequately nourished in appearance. Alert, appropriate and cooperative throughout exam. BMI: 22.6 Appears younger than stated age  Head: Normocephalic without obvious abnormalities  Eyes: No corneal or conjunctival inflammation noted. Pupils equal round reactive to light and accommodation. Extraocular motion intact.  Ears: External  ear exam reveals no significant lesions or deformities. Canals clear .TMs normal. Hearing is grossly normal bilaterally. Nose: External nasal exam reveals no deformity or inflammation. Nasal mucosa are pink and moist. No lesions or exudates noted.   Mouth: Oral mucosa and oropharynx reveal no lesions or exudates.  Teeth in good repair. Neck: No deformities, masses, or tenderness noted. Range of motion &Thyroid normal Lungs: Normal respiratory effort; chest expands symmetrically. Lungs are clear to auscultation without rales, wheezes, or increased work of breathing. Heart: Normal  rhythm. Normal S1 and S2. No gallop, click, or rub. Abdomen: Bowel sounds normal; abdomen soft and nontender. No masses, organomegaly or hernias noted. Genitalia: as per Urology                                Musculoskeletal/extremities: No deformity or scoliosis noted of  the thoracic or lumbar spine.  No clubbing, cyanosis, edema, or significant extremity  deformity noted.  Range of motion normal . Tone & strength normal. Hand joints normal  Fingernail  health good. Able to lie down & sit up w/o help.  Negative SLR bilaterally Vascular: Carotid, radial artery, dorsalis pedis and  posterior tibial pulses are full and equal. No bruits present. Neurologic: Alert and oriented x3. Deep tendon reflexes symmetrical and normal.  Gait normal     Skin: Intact without suspicious lesions or rashes. Lymph: No cervical, axillary lymphadenopathy present. Psych: Mood and affect are normal. Normally interactive  Assessment & Plan:  See Current Assessment & Plan in Problem List under specific Diagnosis

## 2014-09-30 NOTE — Progress Notes (Signed)
Pre visit review using our clinic review tool, if applicable. No additional management support is needed unless otherwise documented below in the visit note. 

## 2014-10-11 ENCOUNTER — Telehealth: Payer: Self-pay | Admitting: Cardiovascular Disease

## 2014-10-11 DIAGNOSIS — I340 Nonrheumatic mitral (valve) insufficiency: Secondary | ICD-10-CM

## 2014-10-11 DIAGNOSIS — R002 Palpitations: Secondary | ICD-10-CM

## 2014-10-11 DIAGNOSIS — R079 Chest pain, unspecified: Secondary | ICD-10-CM

## 2014-10-11 NOTE — Telephone Encounter (Signed)
Spoke with pt.  He reports typically his blood pressure runs 115-118/68 after exercise.  For the last week or so blood pressure has been 80-88/63 after exercise.  Yesterday was 166/92 prior to exercise [place] [facility] [place]. Rechecked a few minutes later and it was 130/70.  After exercising blood pressure was 80/63.  He did feel a little light headed with this.  Rechecked later and it was 100/63.  Went on 2 long bike rides over weekend.  35 miles on Saturday and 48 on Sunday.  Did not check blood pressure over weekend.  He does note increased anxiety lately.Taking Valium as needed. Is staying hydrated while exercising. Occasonal chest tightness. This does not usually occur while riding his bike.  Pt has upcoming trip to [place] in early June for bike riding trip.  Will average 68 miles per day.  He is concerned about recent blood pressure readings with upcoming trip.  Pt states he and Dr. Angelena Form had discussed possible stress test at recent office visit. Pt is questioning if he needs stress test done.  Pt also asking about difference between GXT and nuclear stress testing and I explained the difference to him.  Will forward to Dr. Angelena Form for recommendations.

## 2014-10-11 NOTE — Telephone Encounter (Signed)
We can arrange a stress test. A stress myoview would give [place] more information if he is willing to do this. Gerald Stabs

## 2014-10-11 NOTE — Telephone Encounter (Signed)
New message     Pt c/o BP issue: STAT if pt c/o blurred vision, one-sided weakness or slurred speech  1. What are your last 5 BP readings? 166/92 prior to workout, 80/63 after workout  2. Are you having any other symptoms (ex. Dizziness, headache, blurred vision, passed out)? Felt dizzy and lightheaded after exercise  3. What is your BP issue? bp seems to be dropping after exercise

## 2014-10-12 ENCOUNTER — Encounter: Payer: Self-pay | Admitting: *Deleted

## 2014-10-12 NOTE — Telephone Encounter (Signed)
I spoke with pt and he would like to proceed with stress myoview.  I verbally went over all instructions with pt.  Will have schedulers contact pt to schedule appt.  Will send information to pt via my chart regarding 2 part billing information.

## 2014-10-19 ENCOUNTER — Ambulatory Visit: Payer: Self-pay | Admitting: Cardiovascular Disease

## 2014-10-21 ENCOUNTER — Ambulatory Visit (HOSPITAL_COMMUNITY): Payer: Medicare HMO | Attending: Internal Medicine | Admitting: Radiology

## 2014-10-21 DIAGNOSIS — R079 Chest pain, unspecified: Secondary | ICD-10-CM

## 2014-10-21 DIAGNOSIS — R002 Palpitations: Secondary | ICD-10-CM

## 2014-10-21 DIAGNOSIS — I340 Nonrheumatic mitral (valve) insufficiency: Secondary | ICD-10-CM

## 2014-10-21 MED ORDER — TECHNETIUM TC 99M SESTAMIBI GENERIC - CARDIOLITE
33.0000 | Freq: Once | INTRAVENOUS | Status: AC | PRN
  Administered 2014-10-21: 33 via INTRAVENOUS

## 2014-10-21 MED ORDER — TECHNETIUM TC 99M SESTAMIBI GENERIC - CARDIOLITE
11.0000 | Freq: Once | INTRAVENOUS | Status: AC | PRN
  Administered 2014-10-21: 11 via INTRAVENOUS

## 2014-10-21 NOTE — Progress Notes (Signed)
Edward Mccall [address] NUC[facility] [place], [place] [address] [phone]    Cardiology Nuclear Med Study  [name] is a 69 y.o. male     MRN : [id number]     DOB: June 27, 1946  Procedure Date: 10/21/2014  Nuclear Med Background Indication for Stress Test:  Evaluation for Ischemia History:  MPI 2010 (normal) EF 56% Cardiac Risk Factors: None  Symptoms:  Chest Pain   Nuclear Pre-Procedure Caffeine/Decaff Intake:  None NPO After: 8:00pm   Lungs:  clear O2 Sat: 96% on room air. IV 0.9% NS with Angio Cath:  22g  IV Site: R Antecubital  IV Started by:  Earl Many, CNMT  Chest Size (in):  42 Cup Size: n/a  Height: 5\' 10"  (1.778 m)  Weight:  157 lb (71.215 kg)  BMI:  Body mass index is 22.53 kg/(m^2). Tech Comments:  NA    Nuclear Med Study 1 or 2 day study: 1 day  Stress Test Type:  Stress  Reading MD: [name], MD  Order Authorizing Provider:  C. McAlhany, MD  Resting Radionuclide: Technetium 64m Sestamibi  Resting Radionuclide Dose: 11.0 mCi   Stress Radionuclide:  Technetium 26m Sestamibi  Stress Radionuclide Dose: 33.0 mCi           Stress Protocol Rest HR: 52 Stress HR: 134  Rest BP: 164/85 Stress BP: 203/81  Exercise Time (min): 14:00 METS: 17.5   Predicted Max HR: 151 bpm % Max HR: 88.74 bpm Rate Pressure Product: 27202   Dose of Adenosine (mg):  n/a Dose of Lexiscan: n/a mg  Dose of Atropine (mg): n/a Dose of Dobutamine: n/a mcg/kg/min (at max HR)  Stress Test Technologist: Glade Lloyd, BS-ES  Nuclear Technologist:  Earl Many, CNMT     Rest Procedure:  Myocardial perfusion imaging was performed at rest 45 minutes following the intravenous administration of Technetium 33m Sestamibi. Rest ECG: NSR - Normal EKG  Stress Procedure:  The patient exercised on the treadmill utilizing the Bruce Protocol for 14:00 minutes. The patient stopped due to fatigue and denied any chest pain.  Technetium 46m Sestamibi was injected at  peak exercise and myocardial perfusion imaging was performed after a brief delay. Stress ECG: No significant change from baseline ECG  QPS Raw Data Images:  Normal; no motion artifact; normal heart/lung ratio. Stress Images:  Normal homogeneous uptake in all areas of the myocardium. Rest Images:  Normal homogeneous uptake in all areas of the myocardium. Subtraction (SDS):  There is no evidence of scar or ischemia. Transient Ischemic Dilatation (Normal <1.22):  0.84 Lung/Heart Ratio (Normal <0.45):  0.27  Quantitative Gated Spect Images QGS EDV:  122 ml QGS ESV:  50 ml  Impression Exercise Capacity:  Excellent exercise capacity. BP Response:  Hypertensive blood pressure response. Clinical Symptoms:  Fatigue.  ECG Impression:  No significant ST segment change suggestive of ischemia. Comparison with Prior Nuclear Study: No images to compare  Overall Impression:  Normal stress nuclear study.  LV Ejection Fraction: 59%.  LV Wall Motion:  NL LV Function; NL Wall Motion   [name] 10/21/2014

## 2014-10-24 ENCOUNTER — Telehealth: Payer: Self-pay | Admitting: Cardiovascular Disease

## 2014-10-24 NOTE — Telephone Encounter (Signed)
New message  ° ° °Patient calling for test results.   °

## 2014-10-24 NOTE — Telephone Encounter (Signed)
Spoke with pt and reviewed stress test results with him.

## 2014-11-22 ENCOUNTER — Encounter (HOSPITAL_COMMUNITY): Payer: Self-pay | Admitting: Psychiatry

## 2014-11-22 ENCOUNTER — Ambulatory Visit (INDEPENDENT_AMBULATORY_CARE_PROVIDER_SITE_OTHER): Payer: Medicare HMO | Admitting: Psychiatry

## 2014-11-22 VITALS — BP 136/71 | HR 66 | Ht 70.0 in | Wt 160.6 lb

## 2014-11-22 DIAGNOSIS — F419 Anxiety disorder, unspecified: Secondary | ICD-10-CM

## 2014-11-22 DIAGNOSIS — F411 Generalized anxiety disorder: Secondary | ICD-10-CM

## 2014-11-22 MED ORDER — DIAZEPAM 2 MG PO TABS: 2.0000 mg | ORAL_TABLET | ORAL | Status: DC | PRN

## 2014-11-22 MED ORDER — MIRTAZAPINE 15 MG PO TABS: ORAL_TABLET | ORAL | Status: DC

## 2014-11-22 NOTE — Progress Notes (Signed)
[facility] [phone] Progress Note  Edward Mccall [id number] 69 y.o.   11/22/2014 2:36 PM  Chief Complaint:  Medication management and follow-up.       History of Present Illness: Edward Mccall came for his follow-up appointment.  He has been compliant with Remeron 7.5 mg at bedtime.  He is still have episodes of anxiety and nervousness however frequency and intensity is less from the past.  He is very excited about upcoming bike tour in [place].  He is concerned that if he had a anxiety attack they are.  Sometime he feel that Remeron is not helping however since he is taking Remeron 7.5 mg we have noticed improvement in his anxiety and depression.  His sleep is good.  He tried seeing Edward Mccall however his insurance does not accept that he Edward Mccall.  He is seeing Edward Mccall and he had tried a few times and now he is saying only as needed.  Recently he seen his primary care physician and there has been no changes.  Patient denies any irritability or any anger.  His appetite is okay.  His vitals are stable.  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  He lives with his wife.  Suicidal Ideation: No Plan Formed: No Patient has means to carry out plan: No  Homicidal Ideation: No Plan Formed: No Patient has means to carry out plan: No  ROS Psychiatric: Agitation: No Hallucination: No Depressed Mood: No Insomnia: No Hypersomnia: No Altered Concentration: No Feels Worthless: No Grandiose Ideas: No Belief In Special Powers: No New/Increased Substance Abuse: No Compulsions: No  Neurologic: Headache: No Seizure: No Paresthesias: No  Medical History:  Patient has a mitral valve regurgitation, supraventricular premature beats, multiple back surgeries, GERD, Gilbert's syndrome, history of prostate cancer, spinal stenosis and elevated blood sugar.  His primary care physician is Dr. Linna Darner.   Outpatient Encounter Prescriptions as of 11/22/2014   Medication Sig  . diazepam (VALIUM) 2 MG tablet Take 1 tablet (2 mg total) by mouth as needed for anxiety.  . mirtazapine (REMERON) 15 MG tablet Take 1/2 tab at bed time  . Multiple Vitamin (MULTIVITAMIN) capsule Take 1 capsule by mouth daily.    . Omega-3 Fatty Acids (FISH OIL) 1000 MG CAPS Take 1,000 mg by mouth 2 (two) times daily.   . Probiotic Product (PROBIOTIC DAILY PO) Take 1 capsule by mouth daily.  . [DISCONTINUED] diazepam (VALIUM) 2 MG tablet Take 1 tablet (2 mg total) by mouth as needed for anxiety.  . [DISCONTINUED] mirtazapine (REMERON) 15 MG tablet Take 1/2 tab at bed time    Past Psychiatric History/Hospitalization(s): Patient had tried Effexor, Prozac, Lexapro, Xanax and Zoloft in the past. Anxiety: Yes Bipolar Disorder: No Depression: Yes Mania: No Psychosis: No Schizophrenia: No Personality Disorder: No Hospitalization for psychiatric illness: No History of Electroconvulsive Shock Therapy: No Prior Suicide Attempts: No  Physical Exam: Constitutional:  BP 136/71 mmHg  Pulse 66  Ht 5\' 10"  (1.778 m)  Wt 160 lb 9.6 oz (72.848 kg)  BMI 23.04 kg/m2  General Appearance: well nourished  Recent Results (from the past 2160 hour(s))  Basic metabolic panel     Status: Abnormal   Collection Time: 09/30/14 12:06 PM  Result Value Ref Range   Sodium 140 135 - 145 mEq/L   Potassium 4.1 3.5 - 5.1 mEq/L   Chloride 103 96 - 112 mEq/L   CO2 31 19 - 32 mEq/L   Glucose, Bld 114 (H) 70 -  99 mg/dL   BUN 14 6 - 23 mg/dL   Creatinine, Ser 0.88 0.40 - 1.50 mg/dL   Calcium 9.7 8.4 - 10.5 mg/dL   GFR 91.17 >60.00 mL/min  CBC with Differential/Platelet     Status: None   Collection Time: 09/30/14 12:06 PM  Result Value Ref Range   WBC 5.3 4.0 - 10.5 K/uL   RBC 4.84 4.22 - 5.81 Mil/uL   Hemoglobin 15.1 13.0 - 17.0 g/dL   HCT 43.9 39.0 - 52.0 %   MCV 90.8 78.0 - 100.0 fl   MCHC 34.4 30.0 - 36.0 g/dL   RDW 13.0 11.5 - 15.5 %   Platelets 237.0 150.0 - 400.0 K/uL    Neutrophils Relative % 64.8 43.0 - 77.0 %   Lymphocytes Relative 25.8 12.0 - 46.0 %   Monocytes Relative 7.4 3.0 - 12.0 %   Eosinophils Relative 1.6 0.0 - 5.0 %   Basophils Relative 0.4 0.0 - 3.0 %   Neutro Abs 3.5 1.4 - 7.7 K/uL   Lymphs Abs 1.4 0.7 - 4.0 K/uL   Monocytes Absolute 0.4 0.1 - 1.0 K/uL   Eosinophils Absolute 0.1 0.0 - 0.7 K/uL   Basophils Absolute 0.0 0.0 - 0.1 K/uL  Hepatic function panel     Status: None   Collection Time: 09/30/14 12:06 PM  Result Value Ref Range   Total Bilirubin 1.2 0.2 - 1.2 mg/dL   Bilirubin, Direct 0.2 0.0 - 0.3 mg/dL   Alkaline Phosphatase 58 39 - 117 U/L   AST 23 0 - 37 U/L   ALT 20 0 - 53 U/L   Total Protein 7.1 6.0 - 8.3 g/dL   Albumin 4.4 3.5 - 5.2 g/dL  Lipid panel     Status: Abnormal   Collection Time: 09/30/14 12:06 PM  Result Value Ref Range   Cholesterol 207 (H) 0 - 200 mg/dL    Comment: ATP III Classification       Desirable:  < 200 mg/dL               Borderline High:  200 - 239 mg/dL          High:  > = 240 mg/dL   Triglycerides 63.0 0.0 - 149.0 mg/dL    Comment: Normal:  <150 mg/dLBorderline High:  150 - 199 mg/dL   HDL 88.10 >39.00 mg/dL   VLDL 12.6 0.0 - 40.0 mg/dL   LDL Cholesterol 106 (H) 0 - 99 mg/dL   Total CHOL/HDL Ratio 2     Comment:                Men          Women1/2 Average Risk     3.4          3.3Average Risk          5.0          4.42X Average Risk          9.6          7.13X Average Risk          15.0          11.0                       NonHDL 118.90     Comment: NOTE:  Non-HDL goal should be 30 mg/dL higher than patient's LDL goal (i.e. LDL goal of < 70 mg/dL, would have non-HDL goal of < 100 mg/dL)  TSH     Status: None   Collection Time: 09/30/14 12:06 PM  Result Value Ref Range   TSH 0.83 0.35 - 4.50 uIU/mL  Hemoglobin A1c     Status: None   Collection Time: 09/30/14 12:06 PM  Result Value Ref Range   Hgb A1c MFr Bld 5.8 4.6 - 6.5 %    Comment: Glycemic Control Guidelines for People with Diabetes:Non  Diabetic:  <6%Goal of Therapy: <7%Additional Action Suggested:  >8%    Musculoskeletal: Strength & Muscle Tone: within normal limits Gait & Station: normal Patient leans: N/A  Psychiatric: Speech (describe rate, volume, coherence, spontaneity, and abnormalities if any): Clear and coherent.  Thought Process (describe rate, content, abstract reasoning, and computation): No flight of ideas or any loose association.  Associations: Intact  Thoughts: normal  Mental Status: Orientation: oriented to person, place, time/date and situation Mood & Affect: anxiety Attention Span & Concentration: Good  Established Problem, Stable/Improving (1), Review of Psycho-Social Stressors (1), Review of Last Therapy Session (1) and Review of Medication Regimen & Side Effects (2)  Assessment: Axis I: Anxiety disorder NOS  Axis II: Deferred  Axis III: See medical history  Plan:  Patient overall doing better.  Encouraged to continue Remeron 7.5 mg daily and uses Valium 2 mg as needed for severe anxiety attacks.  He is using Valium 3-4 times a week.  Discussed medication side effects and benefits.  Encouraged to keep appointment with Edward Mccall her coping skills and CBT.  Recommended to call us back if he has any question or any concern.  Follow-up in 3 months.   ARFEEN,SYED T., MD 11/22/2014

## 2014-11-23 ENCOUNTER — Telehealth: Payer: Self-pay

## 2014-11-23 NOTE — Telephone Encounter (Signed)
Call to introduce [place] and per the patient; he is seeing multiple providers and that he was just in for a Physical and does not feel he needs the wellness visit at this time, but does understand the need.  The patient was asked about his pneumonia and stated he thought he had one, but it was a "long time ago."  Updated the patient on CDC recommendation and will fup at his next office visit.

## 2015-01-13 ENCOUNTER — Telehealth (HOSPITAL_COMMUNITY): Payer: Self-pay

## 2015-01-17 NOTE — Telephone Encounter (Signed)
Telephone call with patient after discussion with Dr. Adele Schilder to inform patient Dr. Adele Schilder would rather patient continue with Valium as works well for anxiety, particularly when acute.  Discussed patient's status with not waiting until in a complete panic to take the medication and offered patient an earlier appointment if he felt needed.  Patient declined and stated he was fine with continuing Valium as needed.  Patient agreed to call back if would like an earlier appointment or if problems presented with the medication that he would like Dr. Adele Schilder to address as at this time patient is fine with continuing Valium instead of Klonopin which he stated discussing with his therapist.

## 2015-02-06 ENCOUNTER — Other Ambulatory Visit: Payer: Self-pay

## 2015-02-21 ENCOUNTER — Ambulatory Visit (HOSPITAL_COMMUNITY): Payer: Self-pay | Admitting: Psychiatry

## 2015-02-24 ENCOUNTER — Encounter: Payer: Self-pay | Admitting: Endocrinology

## 2015-02-24 ENCOUNTER — Ambulatory Visit (INDEPENDENT_AMBULATORY_CARE_PROVIDER_SITE_OTHER): Payer: Medicare HMO | Admitting: Endocrinology

## 2015-02-24 VITALS — BP 132/84 | HR 56 | Temp 97.7°F | Ht 70.0 in | Wt 157.0 lb

## 2015-02-24 DIAGNOSIS — N62 Hypertrophy of breast: Secondary | ICD-10-CM

## 2015-02-24 NOTE — Patient Instructions (Signed)
As you see Dr Alinda Money for the prostate, the decision to take testosterone should be between you and him.

## 2015-02-24 NOTE — Progress Notes (Signed)
Subjective:    Patient ID: Edward Mccall, male    DOB: [birth year], 69 y.o.   MRN: [id number]  HPI Pt returns for f/u of gynecomastia and mild idiopathic primary hypogonadism (dx'ed 2013; he did not need karyotype, due to advanced age; rx of hypogonadism was initially declined, due to prostate cancer; recent PSA is 0, after surgery; mammogram showed gynecomastia).  pt says the breast swelling is resolved. pt states he feels well in general, except for anxiety and ED sxs.   Past Medical History  Diagnosis Date  . 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 MITRAL REGURGITATION 11/15/2008    Mild to moderate by echo 2010  . GERD 05/24/2008  . Anxiety 01/02/2011  . Spinal stenosis 01/02/2011  . Prostate cancer     Dr Alinda Money  . Gynecomastia   . History of colonic polyps 09/16/2014    Past Surgical History  Procedure Laterality Date  . Lumbar disc surgery  11/2010    L4-5 discectomy & laminectomy, Dr Ellene Route   . Lumbar disc surgery  01/2011    L4-5 discectomy,Dr Elsner  . Lumbar disc surgery  09/2011    L4-5 minimally invasive fusion , Dr Myrene Buddy, [facility]  . Prostatectomy  2011  . Tooth extraction    . Colonoscopy with polypectomy  1997    neg since; Dr Peters, [place]    History   Social History  . Marital Status: Married    Spouse Name: N/A  . Number of Children: 2  . Years of Education: N/A   Occupational History  . Retired-works part time at [employer]  . Smoking status: Former Smoker -- 1.00 packs/day for 10 years    Types: Cigarettes    Quit date: 08/12/1973  . Smokeless tobacco: Not on file     Comment: smoked 1960-1975, up to 1 ppd  . Alcohol Use: 4.2 oz/week    7 Standard drinks or equivalent per week     Comment:  < 1/day  . Drug Use: No  . Sexual Activity: Not on file   Other Topics Concern  . Not on file   Social History Narrative    Current Outpatient Prescriptions on File Prior to Visit  Medication Sig Dispense Refill  .  diazepam (VALIUM) 2 MG tablet Take 1 tablet (2 mg total) by mouth as needed for anxiety. 30 tablet 1  . mirtazapine (REMERON) 15 MG tablet Take 1/2 tab at bed time 15 tablet 2  . Multiple Vitamin (MULTIVITAMIN) capsule Take 1 capsule by mouth daily.      . Omega-3 Fatty Acids (FISH OIL) 1000 MG CAPS Take 1,000 mg by mouth 2 (two) times daily.     . Probiotic Product (PROBIOTIC DAILY PO) Take 1 capsule by mouth daily.     No current facility-administered medications on file prior to visit.    Allergies  Allergen Reactions  . Zoloft [Sertraline Hcl]     "ramped anxiety up"  Same effect with Prozac & Lexapro    Family History  Problem Relation Age of Onset  . Anxiety disorder Mother   . Diabetes Mother     not definite  . Bladder Cancer Mother   . Anxiety disorder Son   . Anxiety disorder Brother   . Alcohol abuse Father   . CAD Neg Hx   . Stroke Neg Hx   . Heart disease Neg Hx     BP 132/84 mmHg  Pulse 56  Temp(Src) 97.7 F (36.5 C) (Oral)  Ht 5\' 10"  (1.778 m)  Wt 157 lb (71.215 kg)  BMI 22.53 kg/m2  SpO2 97%  Review of Systems Denies decreased urinary stream    Objective:   Physical Exam VITAL SIGNS:  See vs page GENERAL: no distress Ext: no edema.         Assessment & Plan:  Gynecomastia: resolved Mild hypogonadism, primary. No karyotype needed at this age. H/o prostate cancer, s/p successful rx   Patient is advised the following: Patient Instructions  As you see Dr Alinda Money for the prostate, the decision to take testosterone should be between you and him.

## 2015-03-07 ENCOUNTER — Other Ambulatory Visit: Payer: Self-pay | Admitting: General Surgery

## 2015-03-11 ENCOUNTER — Other Ambulatory Visit (HOSPITAL_COMMUNITY): Payer: Self-pay | Admitting: Psychiatry

## 2015-03-12 ENCOUNTER — Telehealth: Payer: Self-pay | Admitting: Surgery

## 2015-03-12 NOTE — Telephone Encounter (Signed)
Edward Mccall had a lipoma removed from his left forearm by Dr. Donne Hazel on 7/26.  This must have been done at [facility], because there is no record in Epic.  He picked off the dermabond and steristrips yesterday and a small area of the wound open and drained.  There is no redness or fever.  I told him to wash it with soap and water daily and dress it with dry gauze.  He'll check with our office tomorrow and possibly move up his appt.  [name], MD, [facility] Pager: [phone] Office phone:  [phone]

## 2015-03-14 ENCOUNTER — Ambulatory Visit (INDEPENDENT_AMBULATORY_CARE_PROVIDER_SITE_OTHER): Payer: Medicare HMO | Admitting: Psychiatry

## 2015-03-14 ENCOUNTER — Encounter (HOSPITAL_COMMUNITY): Payer: Self-pay | Admitting: Psychiatry

## 2015-03-14 VITALS — BP 149/76 | HR 64 | Ht 70.0 in | Wt 159.0 lb

## 2015-03-14 DIAGNOSIS — F411 Generalized anxiety disorder: Secondary | ICD-10-CM | POA: Diagnosis not present

## 2015-03-14 MED ORDER — MIRTAZAPINE 15 MG PO TABS: ORAL_TABLET | ORAL | Status: DC

## 2015-03-14 NOTE — Progress Notes (Signed)
[facility] [phone] Progress Note  MOATAZ TAVIS [id number] 69 y.o.   03/14/2015 2:38 PM  Chief Complaint:  Medication management and follow-up.       History of Present Illness: Edward Mccall came for his follow-up appointment.  He recently came from a three-week trip from the [place].  Patient had a good trip.  He denies any major panic attack.  He was biking and feel very good .  He is now seeing therapist Mr. Apolonio Schneiders and believes therapy is going very well.  He denies any irritability, anger, severe mood swing.  He still takes Valium 2 mg as needed.  He has not taken in a while.  His appetite is okay.  His vitals are stable.  He is prescribed testosterone however his insurance has not approved and he is frustrated.  He is going to talk to his doctor for any alternatives or to get prior authorization.  He sleeps good.  His weight is stable.  His appetite is okay.  He denies any hallucination or any paranoia.  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  Patient lives with his wife.  Suicidal Ideation: No Plan Formed: No Patient has means to carry out plan: No  Homicidal Ideation: No Plan Formed: No Patient has means to carry out plan: No  ROS Psychiatric: Agitation: No Hallucination: No Depressed Mood: No Insomnia: No Hypersomnia: No Altered Concentration: No Feels Worthless: No Grandiose Ideas: No Belief In Special Powers: No New/Increased Substance Abuse: No Compulsions: No  Neurologic: Headache: No Seizure: No Paresthesias: No  Medical History:  Patient has a mitral valve regurgitation, supraventricular premature beats, multiple back surgeries, GERD, Gilbert's syndrome, history of prostate cancer, spinal stenosis and elevated blood sugar.  His primary care physician is Dr. Linna Darner.   Outpatient Encounter Prescriptions as of 03/14/2015  Medication Sig  . diazepam (VALIUM) 2 MG tablet Take 1 tablet (2 mg total) by mouth as needed for  anxiety.  . mirtazapine (REMERON) 15 MG tablet Take 1/2 tab at bed time  . Multiple Vitamin (MULTIVITAMIN) capsule Take 1 capsule by mouth daily.    . Omega-3 Fatty Acids (FISH OIL) 1000 MG CAPS Take 1,000 mg by mouth 2 (two) times daily.   . Probiotic Product (PROBIOTIC DAILY PO) Take 1 capsule by mouth daily.  . [DISCONTINUED] mirtazapine (REMERON) 15 MG tablet Take 1/2 tab at bed time   No facility-administered encounter medications on file as of 03/14/2015.    Past Psychiatric History/Hospitalization(s): Patient had tried Effexor, Prozac, Lexapro, Xanax and Zoloft in the past. Anxiety: Yes Bipolar Disorder: No Depression: Yes Mania: No Psychosis: No Schizophrenia: No Personality Disorder: No Hospitalization for psychiatric illness: No History of Electroconvulsive Shock Therapy: No Prior Suicide Attempts: No  Physical Exam: Constitutional:  BP 149/76 mmHg  Pulse 64  Ht 5\' 10"  (1.778 m)  Wt 159 lb (72.122 kg)  BMI 22.81 kg/m2  General Appearance: well nourished  No results found for this or any previous visit (from the past 2160 hour(s)). Musculoskeletal: Strength & Muscle Tone: within normal limits Gait & Station: normal Patient leans: N/A  Psychiatric: Speech (describe rate, volume, coherence, spontaneity, and abnormalities if any): Clear and coherent.  Thought Process (describe rate, content, abstract reasoning, and computation): No flight of ideas or any loose association.  Associations: Intact  Thoughts: normal  Mental Status: Orientation: oriented to person, place, time/date and situation Mood & Affect: anxiety Attention Span & Concentration: Good  Established Problem, Stable/Improving (1),  Review of Psycho-Social Stressors (1), Review of Last Therapy Session (1) and Review of Medication Regimen & Side Effects (2)  Assessment: Axis I: Anxiety disorder NOS  Axis II: Deferred  Axis III: See medical history  Plan:  Patient is a stable on Remeron 7.5 mg  denies any side effect.  He still has refill remaining on Valium.  Patient like to come back after 6 months for follow-up.  Recommended to call us back if he has any question, concern if he feel worsening of the symptom.  Encouraged to see therapist for coping and social skills.  Follow-up in 6 months.  Zanita Millman T., MD 03/14/2015

## 2015-03-23 ENCOUNTER — Telehealth: Payer: Self-pay | Admitting: *Deleted

## 2015-03-23 ENCOUNTER — Telehealth: Payer: Self-pay | Admitting: Family Medicine

## 2015-03-23 NOTE — Telephone Encounter (Signed)
Appt made per patient request 

## 2015-03-23 NOTE — Telephone Encounter (Signed)
Spoke with patient and Dr Sabra Heck. He had routine labs recently. He will wait until appointment and discuss his concerns with Dr Sabra Heck.

## 2015-03-23 NOTE — Telephone Encounter (Signed)
Appt given for 8-23 per patients request. He wants to know if he can come in for labwork before appt. If possible he would like to come in tomorrow morning. He wants routine labs , PSA, and testosterone level. Please advise and route to [facility]

## 2015-03-24 NOTE — Telephone Encounter (Signed)
Okay for PSA and testosterone

## 2015-04-04 ENCOUNTER — Other Ambulatory Visit: Payer: Self-pay | Admitting: Family Medicine

## 2015-04-04 ENCOUNTER — Ambulatory Visit (INDEPENDENT_AMBULATORY_CARE_PROVIDER_SITE_OTHER): Payer: Medicare HMO | Admitting: Family Medicine

## 2015-04-04 ENCOUNTER — Encounter: Payer: Self-pay | Admitting: Family Medicine

## 2015-04-04 VITALS — BP 124/73 | HR 63 | Temp 97.8°F | Ht 70.0 in | Wt 158.2 lb

## 2015-04-04 DIAGNOSIS — F419 Anxiety disorder, unspecified: Secondary | ICD-10-CM | POA: Diagnosis not present

## 2015-04-04 DIAGNOSIS — Z23 Encounter for immunization: Secondary | ICD-10-CM | POA: Diagnosis not present

## 2015-04-04 DIAGNOSIS — Z8546 Personal history of malignant neoplasm of prostate: Secondary | ICD-10-CM | POA: Diagnosis not present

## 2015-04-04 NOTE — Progress Notes (Signed)
Subjective:    Patient ID: Edward Mccall, male    DOB: Mar 25, 1946, 69 y.o.   MRN: [id number]  HPI 69 year old male who I have known for 40+ years who presents here today to establish care. He had a physical exam earlier this year but he presents today to discuss several concerns and issues.  First he has some anxiety and takes Valium 2 mg sporadically. He is followed by psychiatry in [place] who also have him on Remeron 7.5 mg at bedtime. He thinks this is for anxiety but I suspect it's more as a sleeping aid. It does not seem to affect his appetite at this dosage. He is concerned about overuse of Valium but I do not share his concern given his infrequent usage. We talked about tapering Remeron since he does not feel like it is helping and at this dose would agree. I do not think he is clinically depressed.  He also has a history of prostate cancer having had prostatectomy. He has no clinical symptoms of low testosterone but has shown a low T level on several different lab observations. He wonders about taking testosterone supplement. Apparently his urologist has given his go ahead but I think his symptoms are more on paper thin clinical and I doubt that it would make much difference. He has a prescription for testosterone pump but he just wanted to discuss initiation of this medicine given his history and symptoms or lack thereof. Basically, I will leave it up to him that if he does start we should measure her level in 2-4 months after he begins while at the same time he assesses his clinical symptoms.  Patient Active Problem List   Diagnosis Date Noted  . History of colonic polyps 09/16/2014  . Hyperlipidemia 09/24/2013  . Gynecomastia, male 07/22/2012  . Elevated blood pressure 06/25/2011  . Lipoma 04/06/2011  . Anxiety 01/02/2011  . Spinal stenosis 01/02/2011  . PROSTATE CANCER, HX OF 06/20/2010  . MITRAL REGURGITATION 11/15/2008  . SUPRAVENTRICULAR PREMATURE BEATS 11/15/2008  .  [place] SYNDROME 10/21/2008  . Fasting hyperglycemia 10/21/2008  . GERD 05/24/2008  . Nonspecific abnormal electrocardiogram (ECG) (EKG) 03/25/2007   Outpatient Encounter Prescriptions as of 04/04/2015  Medication Sig  . diazepam (VALIUM) 2 MG tablet Take 1 tablet (2 mg total) by mouth as needed for anxiety.  . mirtazapine (REMERON) 15 MG tablet Take 1/2 tab at bed time  . Multiple Vitamin (MULTIVITAMIN) capsule Take 1 capsule by mouth daily.    . Omega-3 Fatty Acids (FISH OIL) 1000 MG CAPS Take 1,000 mg by mouth 2 (two) times daily.   . Probiotic Product (PROBIOTIC DAILY PO) Take 1 capsule by mouth daily.   No facility-administered encounter medications on file as of 04/04/2015.      Review of Systems  Constitutional: Negative.   HENT: Negative.   Respiratory: Negative.   Cardiovascular: Negative.   Genitourinary: Negative.   Musculoskeletal: Positive for back pain.  Neurological: Negative.   Psychiatric/Behavioral: The patient is nervous/anxious.        Objective:   Physical Exam  Constitutional: He appears well-developed and well-nourished.  Cardiovascular: Normal rate and regular rhythm.   Pulmonary/Chest: Effort normal and breath sounds normal.  Neurological: He is alert.  Psychiatric: He has a normal mood and affect. His behavior is normal. Thought content normal.          Assessment & Plan:  1. PROSTATE CANCER, HX OF Followed by urology. PSA is negative. Question remains whether or not  to begin testosterone for questionable symptoms  2. Anxiety Anxiety is infrequent and use of benzos is sporadic. I have encouraged him to continue on as before and less symptoms are worsening  Wardell Honour MD

## 2015-05-26 ENCOUNTER — Ambulatory Visit (INDEPENDENT_AMBULATORY_CARE_PROVIDER_SITE_OTHER): Payer: Medicare HMO

## 2015-05-26 DIAGNOSIS — Z23 Encounter for immunization: Secondary | ICD-10-CM | POA: Diagnosis not present

## 2015-06-08 ENCOUNTER — Telehealth: Payer: Self-pay

## 2015-06-08 NOTE — Telephone Encounter (Signed)
Discussed AWV and stated he was just in and everything he needs has been completed. He is still considering what he will do post Dr. Linna Darner and has not decided yet. Declines AWV at this time. I did take this opportunity to discuss Hep c which is his only o/d screen.

## 2015-06-10 ENCOUNTER — Other Ambulatory Visit (HOSPITAL_COMMUNITY): Payer: Self-pay | Admitting: Psychiatry

## 2015-06-13 ENCOUNTER — Other Ambulatory Visit (HOSPITAL_COMMUNITY): Payer: Self-pay | Admitting: Psychiatry

## 2015-06-13 DIAGNOSIS — F411 Generalized anxiety disorder: Secondary | ICD-10-CM

## 2015-06-13 MED ORDER — MIRTAZAPINE 15 MG PO TABS: ORAL_TABLET | ORAL | Status: DC

## 2015-08-23 ENCOUNTER — Other Ambulatory Visit: Payer: Self-pay

## 2015-08-23 ENCOUNTER — Ambulatory Visit (HOSPITAL_COMMUNITY): Payer: Medicare Other | Attending: Cardiovascular Disease

## 2015-08-23 ENCOUNTER — Other Ambulatory Visit: Payer: Self-pay | Admitting: Cardiovascular Disease

## 2015-08-23 DIAGNOSIS — I340 Nonrheumatic mitral (valve) insufficiency: Secondary | ICD-10-CM

## 2015-08-24 ENCOUNTER — Ambulatory Visit (INDEPENDENT_AMBULATORY_CARE_PROVIDER_SITE_OTHER): Payer: Medicare Other | Admitting: Cardiovascular Disease

## 2015-08-24 ENCOUNTER — Encounter: Payer: Self-pay | Admitting: Cardiovascular Disease

## 2015-08-24 VITALS — BP 108/74 | HR 54 | Ht 70.0 in | Wt 162.0 lb

## 2015-08-24 DIAGNOSIS — I340 Nonrheumatic mitral (valve) insufficiency: Secondary | ICD-10-CM

## 2015-08-24 DIAGNOSIS — R002 Palpitations: Secondary | ICD-10-CM | POA: Diagnosis not present

## 2015-08-24 NOTE — Patient Instructions (Signed)

## 2015-08-24 NOTE — Progress Notes (Signed)
c  Chief Complaint  Patient presents with  . Follow-up      History of Present Illness: 70 yo male with history of mitral regurgitation, anxiety and GERD who is here today for cardiac follow up. He has been followed by Dr. Johnsie Cancel in the past. I met him for the first time January 2014. Echo January 2014 with mild MR. Stress myoview March 2016 with LVEF=59%, no ischemia. Echo 08/23/15 with normal LV size and function, mild MR. Mild to moderate TR, trivial AI.  He tells me today that he has been doing well.  No chest pain or SOB. He is very active and exercises daily. He cycles. No dizziness, near syncope or syncope.   Primary Care Physician: Binnie Rail, MD   Past Medical History  Diagnosis Date  . 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 MITRAL REGURGITATION 11/15/2008    Mild to moderate by echo 2010  . GERD 05/24/2008  . Anxiety 01/02/2011  . Spinal stenosis 01/02/2011  . Prostate cancer [facility])     Dr Alinda Money  . Gynecomastia   . History of colonic polyps 09/16/2014    Past Surgical History  Procedure Laterality Date  . Lumbar disc surgery  11/2010    L4-5 discectomy & laminectomy, Dr Ellene Route   . Lumbar disc surgery  01/2011    L4-5 discectomy,Dr Elsner  . Lumbar disc surgery  09/2011    L4-5 minimally invasive fusion , Dr Myrene Buddy, [facility]  . Prostatectomy  2011  . Tooth extraction    . Colonoscopy with polypectomy  1997    neg since; Dr Claretta Fraise [facility]    Current Outpatient Prescriptions  Medication Sig Dispense Refill  . diazepam (VALIUM) 2 MG tablet Take 1 tablet (2 mg total) by mouth as needed for anxiety. 30 tablet 1  . mirtazapine (REMERON) 15 MG tablet Take 1/2 tab at bed time 15 tablet 2  . Multiple Vitamin (MULTIVITAMIN) capsule Take 1 capsule by mouth daily.      . Omega-3 Fatty Acids (FISH OIL) 1000 MG CAPS Take 1,000 mg by mouth 2 (two) times daily.      No current facility-administered medications for this visit.    Allergies  Allergen Reactions  . Zoloft  [Sertraline Hcl] Other (See Comments)    "ramped anxiety up"  Same effect with Prozac & Lexapro    Social History   Social History  . Marital Status: Married    Spouse Name: N/A  . Number of Children: 2  . Years of Education: N/A   Occupational History  . Retired-works part time at [employer]  . Smoking status: Former Smoker -- 1.00 packs/day for 10 years    Types: Cigarettes    Quit date: 08/12/1973  . Smokeless tobacco: Not on file     Comment: smoked 1960-1975, up to 1 ppd  . Alcohol Use: 4.2 oz/week    7 Standard drinks or equivalent per week     Comment:  < 1/day  . Drug Use: No  . Sexual Activity: Not on file   Other Topics Concern  . Not on file   Social History Narrative    Family History  Problem Relation Age of Onset  . Anxiety disorder Mother   . Diabetes Mother     not definite  . Bladder Cancer Mother   . Anxiety disorder Son   . Anxiety disorder Brother   . Alcohol abuse Father   . CAD Neg  Hx   . Stroke Neg Hx   . Heart disease Neg Hx     Review of Systems:  As stated in the HPI and otherwise negative.   BP 108/74 mmHg  Pulse 54  Ht _0  (1.778 m)  Wt 162 lb (73.483 kg)  BMI 23.24 kg/m2  SpO2 95%  Physical Examination: General: Well developed, well nourished, NAD HEENT: OP clear, mucus membranes moist SKIN: warm, dry. No rashes. Neuro: No focal deficits Musculoskeletal: Muscle strength 5/5 all ext Psychiatric: Mood and affect normal Neck: No JVD, no carotid bruits, no thyromegaly, no lymphadenopathy. Lungs:Clear bilaterally, no wheezes, rhonci, crackles Cardiovascular: Regular rate and rhythm. Slight systolic murmur. No gallops or rubs. Abdomen:Soft. Bowel sounds present. Non-tender.  Extremities: No lower extremity edema. Pulses are 2 + in the bilateral DP/PT.  Echo 08/23/15: Left ventricle: The cavity size was normal. Wall thickness was normal. Systolic function was normal. The estimated  ejection fraction was in the range of 55% to 60%. Wall motion was normal; there were no regional wall motion abnormalities. Features are consistent with a pseudonormal left ventricular filling pattern, with concomitant abnormal relaxation and increased filling pressure (grade 2 diastolic dysfunction). - Aortic valve: There was trivial regurgitation. - Mitral valve: There was mild regurgitation. - Left atrium: The atrium was mildly dilated. - Right atrium: The atrium was moderately dilated. - Tricuspid valve: There was mild-moderate regurgitation. - Pulmonary arteries: Systolic pressure was mildly increased. PA peak pressure: 33 mm Hg (S).  EKG:  EKG is ordered today. The ekg ordered today demonstrates Sinus brady, rate 54 bpm. LVH with ST depression, unchanged.   Recent Labs: 09/30/2014: ALT 20; BUN 14; Creatinine, Ser 0.88; Hemoglobin 15.1; Platelets 237.0; Potassium 4.1; Sodium 140; TSH 0.83   Lipid Panel    Component Value Date/Time   CHOL 207* 09/30/2014 1206   TRIG 63.0 09/30/2014 1206   HDL 88.10 09/30/2014 1206   CHOLHDL 2 09/30/2014 1206   VLDL 12.6 09/30/2014 1206   LDLCALC 106* 09/30/2014 1206   LDLDIRECT 119.9 09/24/2013 1125     Wt Readings from Last 3 Encounters:  08/24/15 162 lb (73.483 kg)  04/04/15 158 lb 3.2 oz (71.759 kg)  03/14/15 159 lb (72.122 kg)     Other studies Reviewed: Additional studies/ records that were reviewed today include: . Review of the above records demonstrates:    Assessment and Plan:   1. Mitral regurgitation: Mild by echo January 2017. Repeat echo January 2020   2. Palpitations: Known to have PACs. Palpitations resolved with use of Ativan. Avoid stimulants and etoh.   Current medicines are reviewed at length with the patient today.  The patient does not have concerns regarding medicines.  The following changes have been made:  no change  Labs/ tests ordered today include:   Orders Placed This Encounter   Procedures  . EKG 12-Lead    Disposition:   FU with me in 12  months  Signed, Lauree Chandler, MD 08/24/2015 4:23 PM    [facility] [place], [place], [place]  [address] Phone: [phone]; Fax: [phone]     MCALHANY,CHRISTOPHER

## 2015-09-08 ENCOUNTER — Encounter: Payer: Self-pay | Admitting: Internal Medicine

## 2015-09-08 ENCOUNTER — Ambulatory Visit (INDEPENDENT_AMBULATORY_CARE_PROVIDER_SITE_OTHER): Payer: Medicare Other | Admitting: Internal Medicine

## 2015-09-08 VITALS — BP 124/76 | HR 67 | Temp 98.0°F | Resp 16 | Ht 70.0 in | Wt 162.0 lb

## 2015-09-08 DIAGNOSIS — F411 Generalized anxiety disorder: Secondary | ICD-10-CM

## 2015-09-08 DIAGNOSIS — Z0000 Encounter for general adult medical examination without abnormal findings: Secondary | ICD-10-CM

## 2015-09-08 DIAGNOSIS — F419 Anxiety disorder, unspecified: Secondary | ICD-10-CM

## 2015-09-08 MED ORDER — MIRTAZAPINE 7.5 MG PO TABS: 7.5000 mg | ORAL_TABLET | Freq: Every day | ORAL | Status: DC

## 2015-09-08 NOTE — Progress Notes (Signed)
Pre visit review using our clinic review tool, if applicable. No additional management support is needed unless otherwise documented below in the visit note. 

## 2015-09-08 NOTE — Patient Instructions (Signed)
Please follow up in about 6 months for a physical exam.   Have your blood work done one week prior to the appointment.    Medications reviewed and updated.  No changes recommended at this time.

## 2015-09-08 NOTE — Progress Notes (Signed)
Subjective:    Patient ID: Edward Mccall, male    DOB: 1946/01/08, 70 y.o.   MRN: [id number]  HPI He is here to establish with a new pcp.  He is here for routine follow-up Synovectomy prescribe some of his medication.  Anxiety:  He currently follows with psychiatry.  He takes the remeron nightly.  He takes valium only as needed.  He tries not to take it.  He has been stable on his current meds.  If his anxiety flares he will often take the Valium a few days in a row, but he can also follow a week or 2 without taking medication at all. Because he has been so stable on the medication he was hoping I would prescribe it.     Medications and allergies reviewed with patient and updated if appropriate.  Patient Active Problem List   Diagnosis Date Noted  . History of colonic polyps 09/16/2014  . Hyperlipidemia 09/24/2013  . Gynecomastia, male 07/22/2012  . Elevated blood pressure 06/25/2011  . Lipoma 04/06/2011  . Anxiety 01/02/2011  . Spinal stenosis 01/02/2011  . PROSTATE CANCER, HX OF 06/20/2010  . MITRAL REGURGITATION 11/15/2008  . SUPRAVENTRICULAR PREMATURE BEATS 11/15/2008  . [place] SYNDROME 10/21/2008  . Fasting hyperglycemia 10/21/2008  . GERD 05/24/2008  . Nonspecific abnormal electrocardiogram (ECG) (EKG) 03/25/2007    Current Outpatient Prescriptions on File Prior to Visit  Medication Sig Dispense Refill  . diazepam (VALIUM) 2 MG tablet Take 1 tablet (2 mg total) by mouth as needed for anxiety. 30 tablet 1  . mirtazapine (REMERON) 15 MG tablet Take 1/2 tab at bed time 15 tablet 2  . Multiple Vitamin (MULTIVITAMIN) capsule Take 1 capsule by mouth daily.      . Omega-3 Fatty Acids (FISH OIL) 1000 MG CAPS Take 1,000 mg by mouth 2 (two) times daily.      No current facility-administered medications on file prior to visit.    Past Medical History  Diagnosis Date  . 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 MITRAL REGURGITATION 11/15/2008    Mild to moderate by echo 2010    . GERD 05/24/2008  . Anxiety 01/02/2011  . Spinal stenosis 01/02/2011  . Prostate cancer [facility])     Dr Alinda Money  . Gynecomastia   . History of colonic polyps 09/16/2014    Past Surgical History  Procedure Laterality Date  . Lumbar disc surgery  11/2010    L4-5 discectomy & laminectomy, Dr Ellene Route   . Lumbar disc surgery  01/2011    L4-5 discectomy,Dr Elsner  . Lumbar disc surgery  09/2011    L4-5 minimally invasive fusion , Dr Myrene Buddy, [facility]  . Prostatectomy  2011  . Tooth extraction    . Colonoscopy with polypectomy  1997    neg since; Dr Peters, [place]  . Marital Status: Married    Spouse Name: N/A  . Number of Children: 2  . Years of Education: N/A   Occupational History  . Retired-works part time at [employer]  . Smoking status: Former Smoker -- 1.00 packs/day for 10 years    Types: Cigarettes    Quit date: 08/12/1973  . Smokeless tobacco: None     Comment: smoked 1960-1975, up to 1 ppd  . Alcohol Use: 4.2 oz/week    7 Standard drinks or equivalent per week     Comment:  < 1/day  .  Drug Use: No  . Sexual Activity: Not Asked   Other Topics Concern  . None   Social History Narrative    Family History  Problem Relation Age of Onset  . Anxiety disorder Mother   . Diabetes Mother     not definite  . Bladder Cancer Mother   . Anxiety disorder Son   . Anxiety disorder Brother   . Alcohol abuse Father   . CAD Neg Hx   . Stroke Neg Hx   . Heart disease Neg Hx     Review of Systems  Constitutional: Negative for fever and chills.  Respiratory: Negative for cough, shortness of breath and wheezing.   Cardiovascular: Negative for chest pain, palpitations and leg swelling.  Gastrointestinal: Negative for nausea and abdominal pain.  Neurological: Negative for dizziness, light-headedness and headaches.       Objective:   Filed Vitals:   09/08/15 0834  BP: 124/76  Pulse: 67  Temp: 98 F  (36.7 C)  Resp: 16   Filed Weights   09/08/15 0834  Weight: 162 lb (73.483 kg)   Body mass index is 23.24 kg/(m^2).   Physical Exam Constitutional: Appears well-developed and well-nourished. No distress.  Neck: Neck supple. No tracheal deviation present. No thyromegaly present.  No carotid bruit. No cervical adenopathy.   Cardiovascular: Normal rate, regular rhythm and normal heart sounds.   2/6 systolic murmur.  No edema Pulmonary/Chest: Effort normal and breath sounds normal. No respiratory distress. No wheezes.  Psych:  Mood and affect normal      Assessment & Plan:   See Problem List for Assessment and Plan of chronic medical problems.  He will follow-up in approximately 6 months for his physical exam. Advised him I will need to see her twice a year since I will take over prescribing Valium  Blood work prior to physical

## 2015-09-09 NOTE — Assessment & Plan Note (Signed)
Takes Remeron nightly-he does take this for his anxiety is unsure how much it helps. He has thought about at least couple of years. He may decide to try this. He does sleep well with the medication. Taking Valium as needed Overall anxiety is controlled, but he does have some flares Continue current medication regimen I will prescribe this medication

## 2015-09-13 ENCOUNTER — Telehealth: Payer: Self-pay | Admitting: Emergency Medicine

## 2015-09-13 DIAGNOSIS — F411 Generalized anxiety disorder: Secondary | ICD-10-CM

## 2015-09-13 MED ORDER — MIRTAZAPINE 7.5 MG PO TABS: 7.5000 mg | ORAL_TABLET | Freq: Every day | ORAL | Status: DC

## 2015-09-13 MED ORDER — DIAZEPAM 2 MG PO TABS: 2.0000 mg | ORAL_TABLET | ORAL | Status: DC | PRN

## 2015-09-13 NOTE — Telephone Encounter (Signed)
RX as been faxed

## 2015-09-13 NOTE — Telephone Encounter (Signed)
Refill request from [facility] for Diazepam and Mirtazapine. OV on 09/08/15. Okay to fill 90 days?

## 2015-09-13 NOTE — Telephone Encounter (Signed)
yes

## 2015-09-14 ENCOUNTER — Ambulatory Visit (HOSPITAL_COMMUNITY): Payer: Self-pay | Admitting: Psychiatry

## 2015-12-05 ENCOUNTER — Other Ambulatory Visit: Payer: Self-pay | Admitting: Internal Medicine

## 2015-12-11 DIAGNOSIS — H5203 Hypermetropia, bilateral: Secondary | ICD-10-CM | POA: Diagnosis not present

## 2015-12-11 DIAGNOSIS — H2513 Age-related nuclear cataract, bilateral: Secondary | ICD-10-CM | POA: Diagnosis not present

## 2015-12-21 DIAGNOSIS — L814 Other melanin hyperpigmentation: Secondary | ICD-10-CM | POA: Diagnosis not present

## 2015-12-21 DIAGNOSIS — L821 Other seborrheic keratosis: Secondary | ICD-10-CM | POA: Diagnosis not present

## 2015-12-21 DIAGNOSIS — L570 Actinic keratosis: Secondary | ICD-10-CM | POA: Diagnosis not present

## 2015-12-21 DIAGNOSIS — D1801 Hemangioma of skin and subcutaneous tissue: Secondary | ICD-10-CM | POA: Diagnosis not present

## 2015-12-21 DIAGNOSIS — L819 Disorder of pigmentation, unspecified: Secondary | ICD-10-CM | POA: Diagnosis not present

## 2016-01-02 ENCOUNTER — Telehealth: Payer: Self-pay

## 2016-01-02 NOTE — Telephone Encounter (Signed)
Call to schedule AWV; Stated that he sees Dr. Quay Burow in July and doesn't feel he needs to do this. Is getting ready to go on vacation and stated he can talk to Dr. Quay Burow regarding AWV when he comes in for CPE

## 2016-01-23 ENCOUNTER — Other Ambulatory Visit: Payer: Self-pay | Admitting: Internal Medicine

## 2016-02-23 ENCOUNTER — Telehealth: Payer: Self-pay | Admitting: Internal Medicine

## 2016-02-23 NOTE — Telephone Encounter (Signed)
Please advise 

## 2016-02-23 NOTE — Telephone Encounter (Signed)
ok 

## 2016-02-23 NOTE — Telephone Encounter (Signed)
Patient is requesting PSA to be added to labs.   Please follow up once added.

## 2016-02-26 ENCOUNTER — Telehealth: Payer: Self-pay | Admitting: Emergency Medicine

## 2016-02-26 ENCOUNTER — Other Ambulatory Visit (INDEPENDENT_AMBULATORY_CARE_PROVIDER_SITE_OTHER): Payer: Medicare Other

## 2016-02-26 DIAGNOSIS — Z0000 Encounter for general adult medical examination without abnormal findings: Secondary | ICD-10-CM

## 2016-02-26 LAB — TSH: TSH: 1.63 u[IU]/mL (ref 0.35–4.50)

## 2016-02-26 LAB — HEMOGLOBIN A1C: Hgb A1c MFr Bld: 5.7 % (ref 4.6–6.5)

## 2016-02-26 LAB — LIPID PANEL
CHOL/HDL RATIO: 2
CHOLESTEROL: 185 mg/dL (ref 0–200)
HDL: 88.1 mg/dL (ref >39.00)
LDL CALC: 87 mg/dL (ref 0–99)
NONHDL: 96.61
TRIGLYCERIDES: 48 mg/dL (ref 0.0–149.0)
VLDL: 9.6 mg/dL (ref 0.0–40.0)

## 2016-02-26 LAB — COMPREHENSIVE METABOLIC PANEL
ALT: 14 U/L (ref 0–53)
AST: 18 U/L (ref 0–37)
Albumin: 4.1 g/dL (ref 3.5–5.2)
Alkaline Phosphatase: 57 U/L (ref 39–117)
BUN: 18 mg/dL (ref 6–23)
CALCIUM: 9.5 mg/dL (ref 8.4–10.5)
CHLORIDE: 105 meq/L (ref 96–112)
CO2: 28 meq/L (ref 19–32)
Creatinine, Ser: 0.94 mg/dL (ref 0.40–1.50)
GFR: 84.15 mL/min (ref >60.00)
Glucose, Bld: 112 mg/dL — ABNORMAL HIGH (ref 70–99)
Potassium: 4.6 mEq/L (ref 3.5–5.1)
Sodium: 139 mEq/L (ref 135–145)
Total Bilirubin: 0.7 mg/dL (ref 0.2–1.2)
Total Protein: 6.5 g/dL (ref 6.0–8.3)

## 2016-02-26 LAB — CBC WITH DIFFERENTIAL/PLATELET
BASOS PCT: 0.8 % (ref 0.0–3.0)
Basophils Absolute: 0 10*3/uL (ref 0.0–0.1)
Eosinophils Absolute: 0.1 10*3/uL (ref 0.0–0.7)
Eosinophils Relative: 2.3 % (ref 0.0–5.0)
HEMATOCRIT: 40.6 % (ref 39.0–52.0)
Hemoglobin: 14 g/dL (ref 13.0–17.0)
LYMPHS PCT: 30.4 % (ref 12.0–46.0)
Lymphs Abs: 1.5 10*3/uL (ref 0.7–4.0)
MCHC: 34.5 g/dL (ref 30.0–36.0)
MCV: 90.8 fl (ref 78.0–100.0)
MONOS PCT: 7.4 % (ref 3.0–12.0)
Monocytes Absolute: 0.4 10*3/uL (ref 0.1–1.0)
NEUTROS ABS: 2.8 10*3/uL (ref 1.4–7.7)
Neutrophils Relative %: 59.1 % (ref 43.0–77.0)
PLATELETS: 216 10*3/uL (ref 150.0–400.0)
RBC: 4.47 Mil/uL (ref 4.22–5.81)
RDW: 13.3 % (ref 11.5–15.5)
WBC: 4.8 10*3/uL (ref 4.0–10.5)

## 2016-02-26 NOTE — Telephone Encounter (Signed)
Lab is unable to add PSA for pt, would you like pt to come back into lab?

## 2016-02-26 NOTE — Telephone Encounter (Signed)
It is up to pt - call him and we can order if he wants to come back

## 2016-02-26 NOTE — Telephone Encounter (Signed)
Faxed add-on slip to lab...Edward Mccall

## 2016-02-27 LAB — TESTOSTERONE, FREE, TOTAL, SHBG
SEX HORMONE BINDING: 67.8 nmol/L (ref 19.3–76.4)
TESTOSTERONE FREE: 4.1 pg/mL — AB (ref 6.6–18.1)
Testosterone: 400 ng/dL (ref 264–916)

## 2016-02-27 NOTE — Telephone Encounter (Signed)
LVM for pt to call back.

## 2016-02-27 NOTE — Telephone Encounter (Signed)
Patient is requesting PSA to be done.  Please call once lab is entered.

## 2016-02-28 ENCOUNTER — Other Ambulatory Visit (INDEPENDENT_AMBULATORY_CARE_PROVIDER_SITE_OTHER): Payer: Medicare Other

## 2016-02-28 DIAGNOSIS — Z0000 Encounter for general adult medical examination without abnormal findings: Secondary | ICD-10-CM | POA: Diagnosis not present

## 2016-02-28 LAB — PSA: PSA: 0.03 ng/mL — ABNORMAL LOW (ref 0.10–4.00)

## 2016-02-28 NOTE — Telephone Encounter (Signed)
Labs entered,pt aware.

## 2016-02-29 ENCOUNTER — Encounter: Payer: Self-pay | Admitting: Internal Medicine

## 2016-03-03 NOTE — Progress Notes (Signed)
Subjective:    Patient ID: Edward Mccall, male    DOB: [birth year], 70 y.o.   MRN: [id number]  HPI Here for medicare wellness exam/physical exam.   I have personally reviewed and have noted 1.The patient's medical and social history 2.Their use of alcohol, tobacco or illicit drugs 3.Their current medications and supplements 4.The patient's functional ability including ADL's, fall risks, home safety risks and                 hearing or visual impairment. 5.Diet and physical activities 6.Evidence for depression or mood disorders 7.Care team reviewed and updated  - Derm - Dr Ubaldo Glassing, cardiology - Dr Angelena Form   Are there smokers in your home (other than you)? No  Risk Factors Exercise:  Biking, running, weights Dietary issues discussed: well balanced and healthy  Cardiac risk factors: advanced age  Depression Screen  Have you felt down, depressed or hopeless? No  Have you felt little interest or pleasure in doing things?  No  Activities of Daily Living In your present state of health, do you have any difficulty performing the following activities?:  Driving? No Managing money?  No Feeding yourself? No Getting from bed to chair? No Climbing a flight of stairs? No Preparing food and eating?: No Bathing or showering? No Getting dressed: No Getting to/using the toilet? No Moving around from place to place: No In the past year have you fallen or had a near fall?: No   Are you sexually active?  Yes  Do you have more than one partner?  No  Hearing Difficulties: No Do you often ask people to speak up or repeat themselves? No Do you experience ringing or noises in your ears? yes Do you have difficulty understanding soft or whispered voices? No Vision:              Any change in vision:  No             Up to date with eye exam:  Up to date Memory:  Do you feel that you have a problem with memory? No  Do you  often misplace items? No  Do you feel safe at home?  Yes  Cognitive Testing  Alert, Orientated? Yes  Normal Appearance? Yes  Recall of three objects?  Yes  Can perform simple calculations? Yes  Displays appropriate judgment? Yes  Can read the correct time from a watch face? Yes   Advanced Directives have been discussed with the patient? Yes   Medications and allergies reviewed with patient and updated if appropriate.  Patient Active Problem List   Diagnosis Date Noted  . History of colonic polyps 09/16/2014  . Hyperlipidemia 09/24/2013  . Degeneration of intervertebral disc of lumbosacral region 10/29/2011  . Lipoma 04/06/2011  . Anxiety 01/02/2011  . Spinal stenosis 01/02/2011  . PROSTATE CANCER, HX OF 06/20/2010  . MITRAL REGURGITATION 11/15/2008  . SUPRAVENTRICULAR PREMATURE BEATS 11/15/2008  . [place] SYNDROME 10/21/2008  . Fasting hyperglycemia 10/21/2008  . GERD 05/24/2008  . Nonspecific abnormal electrocardiogram (ECG) (EKG) 03/25/2007    Current Outpatient Prescriptions on File Prior to Visit  Medication Sig Dispense Refill  . diazepam (VALIUM) 2 MG tablet Take 1 tablet (2 mg total) by mouth as needed for anxiety. 90 tablet 1  . mirtazapine (REMERON) 7.5 MG tablet Take 1 tablet by mouth at  bedtime 90 tablet 1  . Multiple Vitamin (MULTIVITAMIN) capsule Take 1 capsule by mouth daily.      Marland Kitchen  Omega-3 Fatty Acids (FISH OIL) 1000 MG CAPS Take 1,000 mg by mouth 2 (two) times daily.      No current facility-administered medications on file prior to visit.     Past Medical History:  Diagnosis Date  . Anxiety 01/02/2011  . GERD 05/24/2008  . 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 Gynecomastia   . Gynecomastia, male 07/22/2012  . History of colonic polyps 09/16/2014  . MITRAL REGURGITATION 11/15/2008   Mild to moderate by echo 2010  . Prostate cancer [facility])    Dr Alinda Money  . Spinal stenosis 01/02/2011    Past Surgical History:  Procedure Laterality Date  . colonoscopy with  polypectomy  1997   neg since; Dr Peters, [place]  . LUMBAR DISC SURGERY  11/2010   L4-5 discectomy & laminectomy, Dr Ellene Route   . LUMBAR DISC SURGERY  01/2011   L4-5 discectomy,Dr Elsner  . LUMBAR DISC SURGERY  09/2011   L4-5 minimally invasive fusion , Dr Myrene Buddy, [facility]  . PROSTATECTOMY  2011  . TOOTH EXTRACTION      Social History   Social History  . Marital status: Married    Spouse name: N/A  . Number of children: 2  . Years of education: N/A   Occupational History  . Retired-works part time at [employer]  . Smoking status: Former Smoker    Packs/day: 1.00    Years: 10.00    Types: Cigarettes    Quit date: 08/12/1973  . Smokeless tobacco: Not on file     Comment: smoked 1960-1975, up to 1 ppd  . Alcohol use 4.2 oz/week    7 Standard drinks or equivalent per week     Comment:  < 1/day  . Drug use: No  . Sexual activity: Not on file   Other Topics Concern  . Not on file   Social History Narrative  . No narrative on file    Family History  Problem Relation Age of Onset  . Anxiety disorder Mother   . Diabetes Mother     not definite  . Bladder Cancer Mother   . Anxiety disorder Son   . Anxiety disorder Brother   . Alcohol abuse Father   . CAD Neg Hx   . Stroke Neg Hx   . Heart disease Neg Hx     Review of Systems  Constitutional: Negative for appetite change, chills, fatigue, fever and unexpected weight change.  HENT: Positive for tinnitus. Negative for hearing loss.   Eyes: Negative for visual disturbance.  Respiratory: Negative for cough, shortness of breath and wheezing.   Cardiovascular: Negative.   Gastrointestinal: Negative for abdominal pain, blood in stool, constipation, diarrhea and nausea.       No gerd  Genitourinary: Negative for difficulty urinating, dysuria and hematuria.  Musculoskeletal: Positive for back pain (occasional). Negative for arthralgias.  Skin: Negative for color change  and rash.  Neurological: Negative for dizziness, light-headedness and headaches.  Psychiatric/Behavioral: Negative for dysphoric mood. The patient is nervous/anxious.        Objective:   Vitals:   03/04/16 0806  BP: 116/74  Pulse: 67  Resp: 16  Temp: 98.3 F (36.8 C)   Filed Weights   03/04/16 0806  Weight: 158 lb (71.7 kg)   Body mass index is 22.67 kg/m.   Physical Exam Constitutional: He appears well-developed and well-nourished. No distress.  HENT:  Head: Normocephalic and atraumatic.  Right Ear: External ear  normal.  Left Ear: External ear normal.  Mouth/Throat: Oropharynx is clear and moist.  Normal left ear canal and TM.  Right ear canal with excessive cerumen, TM not visualized  Eyes: Conjunctivae and EOM are normal.  Neck: Neck supple. No tracheal deviation present. No thyromegaly present.  No carotid bruit  Cardiovascular: Normal rate, regular rhythm, normal heart sounds and intact distal pulses.   No murmur heard. Pulmonary/Chest: Effort normal and breath sounds normal. No respiratory distress. He has no wheezes. He has no rales.  Abdominal: Soft. Bowel sounds are normal. He exhibits no distension. There is no tenderness.  Genitourinary: deferred  Musculoskeletal: He exhibits no edema.  Lymphadenopathy:    He has no cervical adenopathy.  Skin: Skin is warm and dry. He is not diaphoretic.  Psychiatric: He has a normal mood and affect. His behavior is normal.         Assessment & Plan:   Wellness Exam: Immunizations  Up to date  Colonoscopy  Up to date  Eye exam  Up to date  Hearing loss - none Memory concerns/difficulties  none Independent of ADLs  - fully   Patient received copy of preventative screening tests/immunizations recommended for the next 5-10 years.  Physical exam: Screening blood work done and reviewed Immunizations  Up to date  Colonoscopy  Up to date  Eye exams  Up to date  EKG - not indicated Exercise - regular, does  vigorous exercise 5-7 days a week Weight - normal BMI Skin - derm annually Substance abuse - none   See Problem List for Assessment and Plan of chronic medical problems.

## 2016-03-03 NOTE — Patient Instructions (Addendum)
[name] , Thank you for taking time to come for your Medicare Wellness Visit. I appreciate your ongoing commitment to your health goals. Please review the following plan we discussed and let me know if I can assist you in the future.   These are the goals we discussed: Goals    None      This is a list of the screening recommended for you and due dates:  Health Maintenance  Topic Date Due  .  Hepatitis C: One time screening is recommended by Center for Disease Control  (CDC) for  adults born from [birth year] through 1966.   71-29-1947  . Flu Shot  03/12/2016  . Pneumonia vaccines (2 of 2 - PPSV23) 04/03/2016  . Colon Cancer Screening  09/24/2018  . Tetanus Vaccine  06/20/2020  . Shingles Vaccine  Addressed     All other Health Maintenance issues reviewed.   All recommended immunizations and age-appropriate screenings are up-to-date or discussed.  No immunizations administered today.   Medications reviewed and updated.  No changes recommended at this time.  Your prescription(s) have been submitted to your pharmacy. Please take as directed and contact our office if you believe you are having problem(s) with the medication(s).   Please followup in 6 months for anxiety  Health Maintenance, Male A healthy lifestyle and preventative care can promote health and wellness.  Maintain regular health, dental, and eye exams.  Eat a healthy diet. Foods like vegetables, fruits, whole grains, low-fat dairy products, and lean protein foods contain the nutrients you need and are low in calories. Decrease your intake of foods high in solid fats, added sugars, and salt. Get information about a proper diet from your health care provider, if necessary.  Regular physical exercise is one of the most important things you can do for your health. Most adults should get at least 150 minutes of moderate-intensity exercise (any activity that increases your heart rate and causes you to sweat) each week. In  addition, most adults need muscle-strengthening exercises on 2 or more days a week.   Maintain a healthy weight. The body mass index (BMI) is a screening tool to identify possible weight problems. It provides an estimate of body fat based on height and weight. Your health care provider can find your BMI and can help you achieve or maintain a healthy weight. For males 20 years and older:  A BMI below 18.5 is considered underweight.  A BMI of 18.5 to 24.9 is normal.  A BMI of 25 to 29.9 is considered overweight.  A BMI of 30 and above is considered obese.  Maintain normal blood lipids and cholesterol by exercising and minimizing your intake of saturated fat. Eat a balanced diet with plenty of fruits and vegetables. Blood tests for lipids and cholesterol should begin at age 56 and be repeated every 5 years. If your lipid or cholesterol levels are high, you are over age 30, or you are at high risk for heart disease, you may need your cholesterol levels checked more frequently.Ongoing high lipid and cholesterol levels should be treated with medicines if diet and exercise are not working.  If you smoke, find out from your health care provider how to quit. If you do not use tobacco, do not start.  Lung cancer screening is recommended for adults aged 80-80 years who are at high risk for developing lung cancer because of a history of smoking. A yearly low-dose CT scan of the lungs is recommended for people who  have at least a 30-pack-year history of smoking and are current smokers or have quit within the past 15 years. A pack year of smoking is smoking an average of 1 pack of cigarettes a day for 1 year (for example, a 30-pack-year history of smoking could mean smoking 1 pack a day for 30 years or 2 packs a day for 15 years). Yearly screening should continue until the smoker has stopped smoking for at least 15 years. Yearly screening should be stopped for people who develop a health problem that would  prevent them from having lung cancer treatment.  If you choose to drink alcohol, do not have more than 2 drinks per day. One drink is considered to be 12 oz (360 mL) of beer, 5 oz (150 mL) of wine, or 1.5 oz (45 mL) of liquor.  Avoid the use of street drugs. Do not share needles with anyone. Ask for help if you need support or instructions about stopping the use of drugs.  High blood pressure causes heart disease and increases the risk of stroke. High blood pressure is more likely to develop in:  People who have blood pressure in the end of the normal range (100-139/85-89 mm Hg).  People who are overweight or obese.  People who are African American.  If you are 71-59 years of age, have your blood pressure checked every 3-5 years. If you are 71 years of age or older, have your blood pressure checked every year. You should have your blood pressure measured twice--once when you are at a hospital or clinic, and once when you are not at a hospital or clinic. Record the average of the two measurements. To check your blood pressure when you are not at a hospital or clinic, you can use:  An automated blood pressure machine at a pharmacy.  A home blood pressure monitor.  If you are 71-86 years old, ask your health care provider if you should take aspirin to prevent heart disease.  Diabetes screening involves taking a blood sample to check your fasting blood sugar level. This should be done once every 3 years after age 69 if you are at a normal weight and without risk factors for diabetes. Testing should be considered at a younger age or be carried out more frequently if you are overweight and have at least 1 risk factor for diabetes.  Colorectal cancer can be detected and often prevented. Most routine colorectal cancer screening begins at the age of 71 and continues through age 37. However, your health care provider may recommend screening at an earlier age if you have risk factors for colon cancer.  On a yearly basis, your health care provider may provide home test kits to check for hidden blood in the stool. A small camera at the end of a tube may be used to directly examine the colon (sigmoidoscopy or colonoscopy) to detect the earliest forms of colorectal cancer. Talk to your health care provider about this at age 102 when routine screening begins. A direct exam of the colon should be repeated every 5-10 years through age 55, unless early forms of precancerous polyps or small growths are found.  People who are at an increased risk for hepatitis B should be screened for this virus. You are considered at high risk for hepatitis B if:  You were born in a country where hepatitis B occurs often. Talk with your health care provider about which countries are considered high risk.  Your parents were born in  a high-risk country and you have not received a shot to protect against hepatitis B (hepatitis B vaccine).  You have HIV or AIDS.  You use needles to inject street drugs.  You live with, or have sex with, someone who has hepatitis B.  You are a man who has sex with other men (MSM).  You get hemodialysis treatment.  You take certain medicines for conditions like cancer, organ transplantation, and autoimmune conditions.  Hepatitis C blood testing is recommended for all people born from [birth year] through 1965 and any individual with known risk factors for hepatitis C.  Healthy men should no longer receive prostate-specific antigen (PSA) blood tests as part of routine cancer screening. Talk to your health care provider about prostate cancer screening.  Testicular cancer screening is not recommended for adolescents or adult males who have no symptoms. Screening includes self-exam, a health care provider exam, and other screening tests. Consult with your health care provider about any symptoms you have or any concerns you have about testicular cancer.  Practice safe sex. Use condoms and avoid  high-risk sexual practices to reduce the spread of sexually transmitted infections (STIs).  You should be screened for STIs, including gonorrhea and chlamydia if:  You are sexually active and are younger than 24 years.  You are older than 24 years, and your health care provider tells you that you are at risk for this type of infection.  Your sexual activity has changed since you were last screened, and you are at an increased risk for chlamydia or gonorrhea. Ask your health care provider if you are at risk.  If you are at risk of being infected with HIV, it is recommended that you take a prescription medicine daily to prevent HIV infection. This is called pre-exposure prophylaxis (PrEP). You are considered at risk if:  You are a man who has sex with other men (MSM).  You are a heterosexual man who is sexually active with multiple partners.  You take drugs by injection.  You are sexually active with a partner who has HIV.  Talk with your health care provider about whether you are at high risk of being infected with HIV. If you choose to begin PrEP, you should first be tested for HIV. You should then be tested every 3 months for as long as you are taking PrEP.  Use sunscreen. Apply sunscreen liberally and repeatedly throughout the day. You should seek shade when your shadow is shorter than you. Protect yourself by wearing long sleeves, pants, a wide-brimmed hat, and sunglasses year round whenever you are outdoors.  Tell your health care provider of new moles or changes in moles, especially if there is a change in shape or color. Also, tell your health care provider if a mole is larger than the size of a pencil eraser.  A one-time screening for abdominal aortic aneurysm (AAA) and surgical repair of large AAAs by ultrasound is recommended for men aged 8-75 years who are current or former smokers.  Stay current with your vaccines (immunizations).   This information is not intended to  replace advice given to you by your health care provider. Make sure you discuss any questions you have with your health care provider.   Document Released: 01/25/2008 Document Revised: 08/19/2014 Document Reviewed: 12/24/2010 Elsevier Interactive Patient Education [employer].

## 2016-03-04 ENCOUNTER — Encounter: Payer: Self-pay | Admitting: Internal Medicine

## 2016-03-04 ENCOUNTER — Ambulatory Visit (INDEPENDENT_AMBULATORY_CARE_PROVIDER_SITE_OTHER): Payer: Medicare Other | Admitting: Internal Medicine

## 2016-03-04 VITALS — BP 116/74 | HR 67 | Temp 98.3°F | Resp 16 | Ht 70.0 in | Wt 158.0 lb

## 2016-03-04 DIAGNOSIS — Z8546 Personal history of malignant neoplasm of prostate: Secondary | ICD-10-CM

## 2016-03-04 DIAGNOSIS — Z0000 Encounter for general adult medical examination without abnormal findings: Secondary | ICD-10-CM | POA: Diagnosis not present

## 2016-03-04 DIAGNOSIS — Z1159 Encounter for screening for other viral diseases: Secondary | ICD-10-CM

## 2016-03-04 DIAGNOSIS — F419 Anxiety disorder, unspecified: Secondary | ICD-10-CM

## 2016-03-04 DIAGNOSIS — F411 Generalized anxiety disorder: Secondary | ICD-10-CM

## 2016-03-04 DIAGNOSIS — I080 Rheumatic disorders of both mitral and aortic valves: Secondary | ICD-10-CM

## 2016-03-04 DIAGNOSIS — R7301 Impaired fasting glucose: Secondary | ICD-10-CM

## 2016-03-04 MED ORDER — MIRTAZAPINE 7.5 MG PO TABS: 7.5000 mg | ORAL_TABLET | Freq: Every day | ORAL | 3 refills | Status: DC

## 2016-03-04 MED ORDER — DIAZEPAM 2 MG PO TABS: 2.0000 mg | ORAL_TABLET | ORAL | 1 refills | Status: DC | PRN

## 2016-03-04 NOTE — Progress Notes (Signed)
Pre visit review using our clinic review tool, if applicable. No additional management support is needed unless otherwise documented below in the visit note. 

## 2016-03-04 NOTE — Assessment & Plan Note (Signed)
Persistently elevated fasting glucose for years a1c normal Exercises regularly and eats healthy

## 2016-03-04 NOTE — Assessment & Plan Note (Addendum)
Recent psa 0.03 Will send to urology -  Should have psa annually unless urology feels he should have it sooner

## 2016-05-14 ENCOUNTER — Ambulatory Visit (INDEPENDENT_AMBULATORY_CARE_PROVIDER_SITE_OTHER): Payer: Medicare Other

## 2016-05-14 DIAGNOSIS — Z23 Encounter for immunization: Secondary | ICD-10-CM

## 2016-09-04 ENCOUNTER — Ambulatory Visit (INDEPENDENT_AMBULATORY_CARE_PROVIDER_SITE_OTHER): Payer: Medicare Other | Admitting: Internal Medicine

## 2016-09-04 VITALS — BP 140/76 | HR 60 | Temp 98.0°F | Resp 16 | Wt 160.0 lb

## 2016-09-04 DIAGNOSIS — F419 Anxiety disorder, unspecified: Secondary | ICD-10-CM

## 2016-09-04 NOTE — Progress Notes (Signed)
Pre visit review using our clinic review tool, if applicable. No additional management support is needed unless otherwise documented below in the visit note. 

## 2016-09-04 NOTE — Progress Notes (Signed)
Subjective:    Patient ID: Edward Mccall, male    DOB: 1945/11/19, 71 y.o.   MRN: [id number]  HPI The patient is here for follow up.  Anxiety: He is taking his remeron nightly as prescribed. He takes the valium as needed and varies how much he takes.  He denies any side effects from the medication. He feels his anxiety is increased for no particular reason.  This has happened in the past.  Over the years his anxiety waxes and wanes.  He is unsure why.    It may be related to weather - he is not getting out on his bike as much which helps. He has taken two valium in the past day. He is still exercising 6-7 days a week.     He never thought the remeron worked and he tapered off of it last month.  He did ok initially but then had anxiety and restarted the remeron.  Depression:  He denies depression, but does feel down because of the anxiety.     He was not sure if he should consider a different medication besides remeron or valium.  He is not sure what he should do.     Medications and allergies reviewed with patient and updated if appropriate.  Patient Active Problem List   Diagnosis Date Noted  . History of colonic polyps 09/16/2014  . Hyperlipidemia 09/24/2013  . Degeneration of intervertebral disc of lumbosacral region 10/29/2011  . Lipoma 04/06/2011  . Anxiety 01/02/2011  . Spinal stenosis 01/02/2011  . PROSTATE CANCER, HX OF 06/20/2010  . MITRAL REGURGITATION 11/15/2008  . SUPRAVENTRICULAR PREMATURE BEATS 11/15/2008  . Lilly SYNDROME 10/21/2008  . Fasting hyperglycemia 10/21/2008  . Nonspecific abnormal electrocardiogram (ECG) (EKG) 03/25/2007    Current Outpatient Prescriptions on File Prior to Visit  Medication Sig Dispense Refill  . diazepam (VALIUM) 2 MG tablet Take 1 tablet (2 mg total) by mouth as needed for anxiety. 90 tablet 1  . mirtazapine (REMERON) 7.5 MG tablet Take 1 tablet (7.5 mg total) by mouth at bedtime. 90 tablet 3  . Multiple Vitamin  (MULTIVITAMIN) capsule Take 1 capsule by mouth daily.      . Omega-3 Fatty Acids (FISH OIL) 1000 MG CAPS Take 1,000 mg by mouth 2 (two) times daily.      No current facility-administered medications on file prior to visit.     Past Medical History:  Diagnosis Date  . Anxiety 01/02/2011  . GERD 05/24/2008  . 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 Gynecomastia   . Gynecomastia, male 07/22/2012  . History of colonic polyps 09/16/2014  . MITRAL REGURGITATION 11/15/2008   Mild to moderate by echo 2010  . Prostate cancer [facility])    Dr Alinda Money  . Spinal stenosis 01/02/2011    Past Surgical History:  Procedure Laterality Date  . colonoscopy with polypectomy  1997   neg since; Dr Peters, [place]  . LUMBAR DISC SURGERY  11/2010   L4-5 discectomy & laminectomy, Dr Ellene Route   . LUMBAR DISC SURGERY  01/2011   L4-5 discectomy,Dr Elsner  . LUMBAR DISC SURGERY  09/2011   L4-5 minimally invasive fusion , Dr Myrene Buddy, [facility]  . PROSTATECTOMY  2011  . TOOTH EXTRACTION      Social History   Social History  . Marital status: Married    Spouse name: N/A  . Number of children: 2  . Years of education: N/A   Occupational History  . Retired-works part time at [employer]   Social History Main Topics  . Smoking status: Former Smoker    Packs/day: 1.00    Years: 10.00    Types: Cigarettes    Quit date: 08/12/1973  . Smokeless tobacco: Former Systems developer    Types: Chew     Comment: smoked 1960-1975, up to 1 ppd  . Alcohol use 4.2 oz/week    7 Standard drinks or equivalent per week     Comment:  < 1/day  . Drug use: No  . Sexual activity: Not on file   Other Topics Concern  . Not on file   Social History Narrative  . No narrative on file    Family History  Problem Relation Age of Onset  . Anxiety disorder Mother   . Diabetes Mother     not definite  . Bladder Cancer Mother   . Anxiety disorder Son   . Anxiety disorder Brother   . Alcohol abuse Father   . CAD Neg Hx   .  Stroke Neg Hx   . Heart disease Neg Hx     Review of Systems  Constitutional: Negative for appetite change.  Respiratory: Negative for shortness of breath.   Cardiovascular: Negative for chest pain and palpitations.  Neurological: Negative for light-headedness and headaches.       Objective:   Vitals:   09/04/16 1439  BP: 140/76  Pulse: 60  Resp: 16  Temp: 98 F (36.7 C)   Wt Readings from Last 3 Encounters:  09/04/16 160 lb (72.6 kg)  03/04/16 158 lb (71.7 kg)  09/08/15 162 lb (73.5 kg)   Body mass index is 22.96 kg/m.   Physical Exam    Constitutional: Appears well-developed and well-nourished. No distress.  Psychiatric: Normal mood and affect. Behavior is normal. Judgement and thought process normal.       Assessment & Plan:    See Problem List for Assessment and Plan of chronic medical problems.

## 2016-09-04 NOTE — Assessment & Plan Note (Addendum)
Increased anxiety recently - waxes and wanes over the years Taking remeron, valium as needed Discussed options - he did not do well on several other medications in the past Will continue current regimen - if no improvement in anxiety over next few weeks he will increase the remeron to 15 mg nightly and see if that helps Valium as needed - do not take too much He is drinking alcohol at times 2 drinks at nights - sometimes has difficulty sleeping.  Discussed than alcohol may affect his sleep and decrease the effectiveness of his medications He will stop drinking for for while and see if that helps F/u in 6 months sooner if needed

## 2016-09-04 NOTE — Patient Instructions (Addendum)
  Medications reviewed and updated.  No changes recommended at this time.  If your anxiety does not improve increase the remeron to 15 mg nightly.   Please followup in 6 months

## 2016-09-05 ENCOUNTER — Encounter: Payer: Self-pay | Admitting: Internal Medicine

## 2016-09-06 ENCOUNTER — Ambulatory Visit (INDEPENDENT_AMBULATORY_CARE_PROVIDER_SITE_OTHER): Payer: Medicare Other | Admitting: Cardiovascular Disease

## 2016-09-06 ENCOUNTER — Encounter: Payer: Self-pay | Admitting: Cardiovascular Disease

## 2016-09-06 VITALS — BP 128/68 | HR 55 | Ht 70.0 in | Wt 160.0 lb

## 2016-09-06 DIAGNOSIS — I340 Nonrheumatic mitral (valve) insufficiency: Secondary | ICD-10-CM

## 2016-09-06 DIAGNOSIS — R002 Palpitations: Secondary | ICD-10-CM | POA: Diagnosis not present

## 2016-09-06 NOTE — Patient Instructions (Signed)
Medication Instructions:  Your physician recommends that you continue on your current medications as directed. Please refer to the Current Medication list given to you today.   Labwork: none  Testing/Procedures: none  Follow-Up: Your physician recommends that you schedule a follow-up appointment in: 12 months. Please call our office in 9 months to schedule this appointment    Any Other Special Instructions Will Be Listed Below (If Applicable).     If you need a refill on your cardiac medications before your next appointment, please call your pharmacy.   

## 2016-09-06 NOTE — Progress Notes (Signed)
c  Chief Complaint  Patient presents with  . Follow-up      History of Present Illness: 71 yo male with history of mitral regurgitation, anxiety and GERD who is here today for cardiac follow up. He has been followed by Dr. Johnsie Cancel in the past. I met him for the first time January 2014. Echo January 2014 with mild MR. Stress myoview March 2016 with LVEF=59%, no ischemia. Echo 08/23/15 with normal LV size and function, mild MR. Mild to moderate TR, trivial AI.  He tells me today that he has been doing well.  No chest pain or SOB. He is very active and exercises daily. He cycles. No dizziness, near syncope or syncope.   Primary Care Physician: Binnie Rail, MD   Past Medical History:  Diagnosis Date  . Anxiety 01/02/2011  . GERD 05/24/2008  . 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 Gynecomastia   . Gynecomastia, male 07/22/2012  . History of colonic polyps 09/16/2014  . MITRAL REGURGITATION 11/15/2008   Mild to moderate by echo 2010  . Prostate cancer [facility])    Dr Alinda Money  . Spinal stenosis 01/02/2011    Past Surgical History:  Procedure Laterality Date  . colonoscopy with polypectomy  1997   neg since; Dr Peters, [place]  . LUMBAR DISC SURGERY  11/2010   L4-5 discectomy & laminectomy, Dr Ellene Route   . LUMBAR DISC SURGERY  01/2011   L4-5 discectomy,Dr Elsner  . LUMBAR DISC SURGERY  09/2011   L4-5 minimally invasive fusion , Dr Myrene Buddy, [facility]  . PROSTATECTOMY  2011  . TOOTH EXTRACTION      Current Outpatient Prescriptions  Medication Sig Dispense Refill  . diazepam (VALIUM) 2 MG tablet Take 1 tablet (2 mg total) by mouth as needed for anxiety. 90 tablet 1  . mirtazapine (REMERON) 7.5 MG tablet Take 1 tablet (7.5 mg total) by mouth at bedtime. 90 tablet 3  . Multiple Vitamin (MULTIVITAMIN) capsule Take 1 capsule by mouth daily.      . Omega-3 Fatty Acids (FISH OIL) 1000 MG CAPS Take 1,000 mg by mouth 2 (two) times daily.      No current facility-administered medications for this  visit.     Allergies  Allergen Reactions  . Zoloft [Sertraline Hcl] Other (See Comments)    "ramped anxiety up"  Same effect with Prozac & Lexapro    Social History   Social History  . Marital status: Married    Spouse name: N/A  . Number of children: 2  . Years of education: N/A   Occupational History  . Retired-works part time at [employer]  . Smoking status: Former Smoker    Packs/day: 1.00    Years: 10.00    Types: Cigarettes    Quit date: 08/12/1973  . Smokeless tobacco: Former Systems developer    Types: Chew     Comment: smoked 1960-1975, up to 1 ppd  . Alcohol use 4.2 oz/week    7 Standard drinks or equivalent per week     Comment:  < 1/day  . Drug use: No  . Sexual activity: Not on file   Other Topics Concern  . Not on file   Social History Narrative  . No narrative on file    Family History  Problem Relation Age of Onset  . Anxiety disorder Mother   . Diabetes Mother     not definite  . Bladder Cancer Mother   .  Anxiety disorder Son   . Anxiety disorder Brother   . Alcohol abuse Father   . CAD Neg Hx   . Stroke Neg Hx   . Heart disease Neg Hx     Review of Systems:  As stated in the HPI and otherwise negative.   BP 128/68   Pulse (!) 55   Ht 5' 10" (1.778 m)   Wt 160 lb (72.6 kg)   BMI 22.96 kg/m   Physical Examination: General: Well developed, well nourished, NAD  HEENT: OP clear, mucus membranes moist  SKIN: warm, dry. No rashes. Neuro: No focal deficits  Musculoskeletal: Muscle strength 5/5 all ext  Psychiatric: Mood and affect normal  Neck: No JVD, no carotid bruits, no thyromegaly, no lymphadenopathy.  Lungs:Clear bilaterally, no wheezes, rhonci, crackles Cardiovascular: Regular rate and rhythm. Slight systolic murmur. No gallops or rubs. Abdomen:Soft. Bowel sounds present. Non-tender.  Extremities: No lower extremity edema. Pulses are 2 + in the bilateral DP/PT.  Echo 08/23/15: Left  ventricle: The cavity size was normal. Wall thickness was normal. Systolic function was normal. The estimated ejection fraction was in the range of 55% to 60%. Wall motion was normal; there were no regional wall motion abnormalities. Features are consistent with a pseudonormal left ventricular filling pattern, with concomitant abnormal relaxation and increased filling pressure (grade 2 diastolic dysfunction). - Aortic valve: There was trivial regurgitation. - Mitral valve: There was mild regurgitation. - Left atrium: The atrium was mildly dilated. - Right atrium: The atrium was moderately dilated. - Tricuspid valve: There was mild-moderate regurgitation. - Pulmonary arteries: Systolic pressure was mildly increased. PA peak pressure: 33 mm Hg (S).  EKG:  EKG is ordered today. The ekg ordered today demonstrates Sinus brady, rate 55 bpm. T wave inversion inferior leads and lateral leads. Unchanged.   Recent Labs: 02/26/2016: ALT 14; BUN 18; Creatinine, Ser 0.94; Hemoglobin 14.0; Platelets 216.0; Potassium 4.6; Sodium 139; TSH 1.63   Lipid Panel    Component Value Date/Time   CHOL 185 02/26/2016 0800   TRIG 48.0 02/26/2016 0800   HDL 88.10 02/26/2016 0800   CHOLHDL 2 02/26/2016 0800   VLDL 9.6 02/26/2016 0800   LDLCALC 87 02/26/2016 0800   LDLDIRECT 119.9 09/24/2013 1125     Wt Readings from Last 3 Encounters:  09/06/16 160 lb (72.6 kg)  09/04/16 160 lb (72.6 kg)  03/04/16 158 lb (71.7 kg)     Other studies Reviewed: Additional studies/ records that were reviewed today include: . Review of the above records demonstrates:    Assessment and Plan:   1. Mitral regurgitation: Mild by echo January 2017. Repeat echo January 2020   2. Palpitations: Known to have PACs. Palpitations resolved with use of Ativan. Avoid stimulants and etoh.   Current medicines are reviewed at length with the patient today.  The patient does not have concerns regarding medicines.  The  following changes have been made:  no change  Labs/ tests ordered today include:   Orders Placed This Encounter  Procedures  . EKG 12-Lead    Disposition:   FU with me in 12  months  Signed, Lauree Chandler, MD 09/06/2016 10:10 AM    [facility] [address], [place], [place]  [address] Phone: [phone]; Fax: [phone]     Lauree Chandler

## 2016-11-01 ENCOUNTER — Ambulatory Visit (INDEPENDENT_AMBULATORY_CARE_PROVIDER_SITE_OTHER): Payer: Medicare Other | Admitting: Nurse Practitioner

## 2016-11-01 ENCOUNTER — Other Ambulatory Visit (INDEPENDENT_AMBULATORY_CARE_PROVIDER_SITE_OTHER): Payer: Medicare Other

## 2016-11-01 ENCOUNTER — Encounter: Payer: Self-pay | Admitting: Nurse Practitioner

## 2016-11-01 ENCOUNTER — Ambulatory Visit (HOSPITAL_COMMUNITY)
Admission: RE | Admit: 2016-11-01 | Discharge: 2016-11-01 | Disposition: A | Discharge status: Home / Self Care | Payer: Medicare Other | Source: Ambulatory Visit | Attending: Nurse Practitioner | Admitting: Nurse Practitioner

## 2016-11-01 VITALS — BP 146/80 | HR 49 | Temp 97.7°F | Ht 70.0 in | Wt 160.0 lb

## 2016-11-01 DIAGNOSIS — M542 Cervicalgia: Secondary | ICD-10-CM

## 2016-11-01 DIAGNOSIS — R221 Localized swelling, mass and lump, neck: Secondary | ICD-10-CM

## 2016-11-01 DIAGNOSIS — E049 Nontoxic goiter, unspecified: Secondary | ICD-10-CM | POA: Diagnosis not present

## 2016-11-01 DIAGNOSIS — E041 Nontoxic single thyroid nodule: Secondary | ICD-10-CM

## 2016-11-01 LAB — CBC WITH DIFFERENTIAL/PLATELET
BASOS PCT: 0.8 % (ref 0.0–3.0)
Basophils Absolute: 0.1 10*3/uL (ref 0.0–0.1)
EOS PCT: 3.6 % (ref 0.0–5.0)
Eosinophils Absolute: 0.2 10*3/uL (ref 0.0–0.7)
HEMATOCRIT: 43.8 % (ref 39.0–52.0)
Hemoglobin: 14.8 g/dL (ref 13.0–17.0)
LYMPHS ABS: 1.4 10*3/uL (ref 0.7–4.0)
LYMPHS PCT: 21.6 % (ref 12.0–46.0)
MCHC: 33.8 g/dL (ref 30.0–36.0)
MCV: 91.9 fl (ref 78.0–100.0)
MONOS PCT: 7.7 % (ref 3.0–12.0)
Monocytes Absolute: 0.5 10*3/uL (ref 0.1–1.0)
NEUTROS ABS: 4.4 10*3/uL (ref 1.4–7.7)
NEUTROS PCT: 66.3 % (ref 43.0–77.0)
PLATELETS: 254 10*3/uL (ref 150.0–400.0)
RBC: 4.77 Mil/uL (ref 4.22–5.81)
RDW: 13.2 % (ref 11.5–15.5)
WBC: 6.6 10*3/uL (ref 4.0–10.5)

## 2016-11-01 NOTE — Progress Notes (Signed)
Pre visit review using our clinic review tool, if applicable. No additional management support is needed unless otherwise documented below in the visit note. 

## 2016-11-01 NOTE — Patient Instructions (Signed)
Go to [facility] (main entrance, registration desk) at 2:30pm for Thyroid US.  Go to lab for blood draw.

## 2016-11-01 NOTE — Progress Notes (Signed)
Subjective:  Patient ID: Edward Mccall, male    DOB: [birth year]  Age: 71 y.o. MRN: [id number]  CC: Pain (thorat pain,lump on throat area last night. )   Neck Pain   This is a new problem. The current episode started in the past 7 days. The problem occurs constantly. The problem has been unchanged. The pain is associated with nothing. The pain is present in the anterior neck. The quality of the pain is described as aching (and fullness). The pain is mild. Nothing aggravates the symptoms. Pertinent negatives include no chest pain, fever, headaches, leg pain, numbness, pain with swallowing, paresis, photophobia, syncope, tingling, trouble swallowing, visual change, weakness or weight loss. He has tried nothing for the symptoms.    Outpatient Medications Prior to Visit  Medication Sig Dispense Refill  . diazepam (VALIUM) 2 MG tablet Take 1 tablet (2 mg total) by mouth as needed for anxiety. 90 tablet 1  . mirtazapine (REMERON) 7.5 MG tablet Take 1 tablet (7.5 mg total) by mouth at bedtime. 90 tablet 3  . Multiple Vitamin (MULTIVITAMIN) capsule Take 1 capsule by mouth daily.      . Omega-3 Fatty Acids (FISH OIL) 1000 MG CAPS Take 1,000 mg by mouth 2 (two) times daily.      No facility-administered medications prior to visit.     ROS See HPI  Objective:  BP (!) 146/80   Pulse (!) 49   Temp 97.7 F (36.5 C)   Ht 5\' 10"  (1.778 m)   Wt 160 lb (72.6 kg)   SpO2 98%   BMI 22.96 kg/m   BP Readings from Last 3 Encounters:  11/01/16 (!) 146/80  09/06/16 128/68  09/04/16 140/76    Wt Readings from Last 3 Encounters:  11/01/16 160 lb (72.6 kg)  09/06/16 160 lb (72.6 kg)  09/04/16 160 lb (72.6 kg)    Physical Exam  Constitutional: He is oriented to person, place, and time. No distress.  HENT:  Right Ear: External ear normal.  Left Ear: External ear normal.  Nose: Nose normal.  Mouth/Throat: Uvula is midline. No oral lesions.  Neck: Trachea normal, normal range of motion and  full passive range of motion without pain. Neck supple. No JVD present. Thyroid mass present.    Palpable fixed nodule on right side of thyroid, nontender  Cardiovascular: Normal rate.   Pulmonary/Chest: Effort normal.  Lymphadenopathy:    He has no cervical adenopathy.  Neurological: He is alert and oriented to person, place, and time.  Vitals reviewed.   Lab Results  Component Value Date   WBC 6.6 11/01/2016   HGB 14.8 11/01/2016   HCT 43.8 11/01/2016   PLT 254.0 11/01/2016   GLUCOSE 112 (H) 02/26/2016   CHOL 185 02/26/2016   TRIG 48.0 02/26/2016   HDL 88.10 02/26/2016   LDLDIRECT 119.9 09/24/2013   LDLCALC 87 02/26/2016   ALT 14 02/26/2016   AST 18 02/26/2016   NA 139 02/26/2016   K 4.6 02/26/2016   CL 105 02/26/2016   CREATININE 0.94 02/26/2016   BUN 18 02/26/2016   CO2 28 02/26/2016   TSH 1.63 02/26/2016   PSA 0.03 (L) 02/28/2016   HGBA1C 5.7 02/26/2016    Dg Lumbar Spine Complete W/bend  Result Date: 10/29/2013 CLINICAL DATA:  Status post minimally invasive fusion with persistent left gluteal pain EXAM: LUMBAR SPINE - COMPLETE WITH BENDING VIEWS COMPARISON:  07/18/2011 FINDINGS: Five lumbar type vertebral bodies are well visualized. There are changes consistent with prior  interbody fusion at L4-5 with pedicular screws and posterior fixation. Disc space narrowing is noted at L5-S1. Minimal retrolisthesis of L3 with respect L4 is noted. This is stable from the prior exam. No pars defects are seen. Flexion and extension views show reduction of the retrolisthesis of L3 on L4 on flexion. It is stable on extension. No other focal abnormality is seen. IMPRESSION: Postoperative changes. Multilevel degenerative change with minimal retrolisthesis of L3 on L4 which is reduced on flexion. Electronically Signed   By: Inez Catalina M.D.   On: 10/29/2013 11:45    Assessment & Plan:   Edward Mccall was seen today for pain.  Diagnoses and all orders for this visit:  Benign thyroid  cyst -     Thyroid Profile; Future -     US THYROID; Future -     CBC w/Diff; Future  Neck pain, acute -     Thyroid Profile; Future -     US THYROID; Future -     CBC w/Diff; Future   I am having Edward Mccall maintain his Fish Oil, multivitamin, diazepam, and mirtazapine.  No orders of the defined types were placed in this encounter.   Follow-up: Return if symptoms worsen or fail to improve.  Wilfred Lacy, NP

## 2016-11-02 LAB — THYROID PANEL
FREE THYROXINE INDEX: 2.2 (ref 1.2–4.9)
T3 Uptake Ratio: 28 % (ref 24–39)
T4, Total: 8 ug/dL (ref 4.5–12.0)

## 2016-11-04 NOTE — Progress Notes (Signed)
Normal results, see office note

## 2016-12-05 ENCOUNTER — Other Ambulatory Visit (INDEPENDENT_AMBULATORY_CARE_PROVIDER_SITE_OTHER): Payer: Medicare Other

## 2016-12-05 ENCOUNTER — Encounter: Payer: Self-pay | Admitting: Internal Medicine

## 2016-12-05 ENCOUNTER — Ambulatory Visit (INDEPENDENT_AMBULATORY_CARE_PROVIDER_SITE_OTHER): Payer: Medicare Other | Admitting: Internal Medicine

## 2016-12-05 ENCOUNTER — Telehealth: Payer: Self-pay | Admitting: Cardiovascular Disease

## 2016-12-05 VITALS — BP 170/92 | HR 52 | Ht 70.0 in | Wt 157.0 lb

## 2016-12-05 DIAGNOSIS — R001 Bradycardia, unspecified: Secondary | ICD-10-CM | POA: Diagnosis not present

## 2016-12-05 DIAGNOSIS — R0989 Other specified symptoms and signs involving the circulatory and respiratory systems: Secondary | ICD-10-CM

## 2016-12-05 DIAGNOSIS — F419 Anxiety disorder, unspecified: Secondary | ICD-10-CM

## 2016-12-05 DIAGNOSIS — I10 Essential (primary) hypertension: Secondary | ICD-10-CM

## 2016-12-05 DIAGNOSIS — Z1159 Encounter for screening for other viral diseases: Secondary | ICD-10-CM | POA: Diagnosis not present

## 2016-12-05 LAB — BASIC METABOLIC PANEL
BUN: 17 mg/dL (ref 6–23)
CHLORIDE: 103 meq/L (ref 96–112)
CO2: 32 meq/L (ref 19–32)
Calcium: 10 mg/dL (ref 8.4–10.5)
Creatinine, Ser: 0.87 mg/dL (ref 0.40–1.50)
GFR: 91.8 mL/min (ref >60.00)
Glucose, Bld: 109 mg/dL — ABNORMAL HIGH (ref 70–99)
POTASSIUM: 4.4 meq/L (ref 3.5–5.1)
SODIUM: 140 meq/L (ref 135–145)

## 2016-12-05 LAB — HEPATITIS C ANTIBODY: HCV Ab: NEGATIVE

## 2016-12-05 MED ORDER — VALSARTAN 80 MG PO TABS: 80.0000 mg | ORAL_TABLET | Freq: Every day | ORAL | 11 refills | Status: DC

## 2016-12-05 NOTE — Assessment & Plan Note (Signed)
Pt is seeking to make anxiety his main problem causing the HTN, but I will defer further psych tx to PCP
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
## 2016-12-05 NOTE — Patient Instructions (Signed)
Ok to start low dose diovan 80 mg per day (generic)  Please continue to monitor your Blood Pressure on a regular basis, with the goal being less than 140/90  Please continue all other medications as before  Please have the pharmacy call with any other refills you may need.  Please keep your appointments with your specialists as you may have planned  Please go to the LAB in the Basement (turn left off the elevator) for the tests to be done today  You will be contacted by phone if any changes need to be made immediately.  Otherwise, you will receive a letter about your results with an explanation, but please check with MyChart first.  Please remember to sign up for MyChart if you have not done so, as this will be important to you in the future with finding out test results, communicating by private email, and scheduling acute appointments online when needed.  Please return in 1-2 weeks, or sooner if needed, to Dr Quay Burow

## 2016-12-05 NOTE — Progress Notes (Signed)
Pre visit review using our clinic review tool, if applicable. No additional management support is needed unless otherwise documented below in the visit note. 

## 2016-12-05 NOTE — Assessment & Plan Note (Signed)
Chronic stable, cont to monitor 

## 2016-12-05 NOTE — Telephone Encounter (Signed)
Pt called because his BP was up  the last couple of days. Pt states that yesterday after working out his BP was 126/72. This morning when he woke up he felt anxious, his BP was 192/90 HR 46, no other symptoms. Pt does not have a history of hypertension. Pt wants to know if he needs to be seen by Dr Angelena Form or to call his PCP. Pt was made aware that if he wants to be seen this week we can make an appointment with a PN/NP. Pt states that he will call his PCP to see what he recommends. Pt will call back if needed.
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
## 2016-12-05 NOTE — Telephone Encounter (Signed)
Edward Mccall is calling because his blood pressure has been spiking for th last couple of days . This morning  it was 192/90 and his resting pulse 46. Please call

## 2016-12-07 ENCOUNTER — Encounter: Payer: Self-pay | Admitting: Internal Medicine

## 2016-12-13 ENCOUNTER — Encounter: Payer: Self-pay | Admitting: Internal Medicine

## 2016-12-19 NOTE — Progress Notes (Signed)
Subjective:    Patient ID: Edward Mccall, male    DOB: 1946-04-14, 71 y.o.   MRN: [id number]  HPI The patient is here for follow up.  He saw Dr Jenny Reichmann two weeks ago and was started on diovan 80 mg daily for elevated BP. BP is variable at home.  Nights BP is low.  Saturday 113/66.   At night 99/60.  Occasionally, in the morning his BP spikes - usually it is related to his anxiety - 159/79 pulse 47.    He exercises regularly - biking, hiking and kayaking for extended periods of time.    Anxiety:  His anxiety was not controlled and he felt that was the reason his BP was not controlled.  No changes in medication was made.  He thinks the anxiety has been higher since starting the diovan.  He has been resistant to several anxiety meds in the past.  We have discussed in the past regarding taking valium more regularly.    Medications and allergies reviewed with patient and updated if appropriate.  Patient Active Problem List   Diagnosis Date Noted  . Bradycardia 12/05/2016  . Labile hypertension 12/05/2016  . History of colonic polyps 09/16/2014  . Hyperlipidemia 09/24/2013  . Degeneration of intervertebral disc of lumbosacral region 10/29/2011  . Lipoma 04/06/2011  . Anxiety 01/02/2011  . Spinal stenosis 01/02/2011  . PROSTATE CANCER, HX OF 06/20/2010  . MITRAL REGURGITATION 11/15/2008  . Supraventricular premature beats 11/15/2008  . 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 Fasting hyperglycemia 10/21/2008  . Nonspecific abnormal electrocardiogram (ECG) (EKG) 03/25/2007    Current Outpatient Prescriptions on File Prior to Visit  Medication Sig Dispense Refill  . diazepam (VALIUM) 2 MG tablet Take 1 tablet (2 mg total) by mouth as needed for anxiety. 90 tablet 1  . mirtazapine (REMERON) 7.5 MG tablet Take 1 tablet (7.5 mg total) by mouth at bedtime. 90 tablet 3  . Multiple Vitamin (MULTIVITAMIN) capsule Take 1 capsule by mouth daily.      . Omega-3 Fatty Acids (FISH OIL) 1000 MG CAPS Take  1,000 mg by mouth 2 (two) times daily.     . valsartan (DIOVAN) 80 MG tablet Take 1 tablet (80 mg total) by mouth daily. 30 tablet 11   No current facility-administered medications on file prior to visit.     Past Medical History:  Diagnosis Date  . Anxiety 01/02/2011  . GERD 05/24/2008  . 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 Gynecomastia   . Gynecomastia, male 07/22/2012  . History of colonic polyps 09/16/2014  . MITRAL REGURGITATION 11/15/2008   Mild to moderate by echo 2010  . Prostate cancer [facility])    Dr Alinda Money  . Spinal stenosis 01/02/2011    Past Surgical History:  Procedure Laterality Date  . colonoscopy with polypectomy  1997   neg since; Dr Peters, [place]  . LUMBAR DISC SURGERY  11/2010   L4-5 discectomy & laminectomy, Dr Ellene Route   . LUMBAR DISC SURGERY  01/2011   L4-5 discectomy,Dr Elsner  . LUMBAR DISC SURGERY  09/2011   L4-5 minimally invasive fusion , Dr Myrene Buddy, [facility]  . PROSTATECTOMY  2011  . TOOTH EXTRACTION      Social History   Social History  . Marital status: Married    Spouse name: N/A  . Number of children: 2  . Years of education: N/A   Occupational History  . Retired-works part time at [employer]  Main Topics  . Smoking status: Former Smoker    Packs/day: 1.00    Years: 10.00    Types: Cigarettes    Quit date: 08/12/1973  . Smokeless tobacco: Former Systems developer    Types: Chew     Comment: smoked 1960-1975, up to 1 ppd  . Alcohol use 4.2 oz/week    7 Standard drinks or equivalent per week     Comment:  < 1/day  . Drug use: No  . Sexual activity: Not on file   Other Topics Concern  . Not on file   Social History Narrative  . No narrative on file    Family History  Problem Relation Age of Onset  . Anxiety disorder Mother   . Diabetes Mother        not definite  . Bladder Cancer Mother   . Anxiety disorder Son   . Anxiety disorder Brother   . Alcohol abuse Father   . CAD Neg Hx   . Stroke Neg Hx   .  Heart disease Neg Hx     Review of Systems  Constitutional: Negative for fever.  Respiratory: Negative for cough, shortness of breath and wheezing.   Cardiovascular: Positive for palpitations (rare, transient). Negative for chest pain and leg swelling.  Neurological: Positive for light-headedness (rare, when BP too low) and headaches (occasional, mild).  Psychiatric/Behavioral: Negative for dysphoric mood. The patient is nervous/anxious.        Objective:   Vitals:   12/20/16 0856  BP: 104/64  Pulse: (!) 55  Resp: 16  Temp: 98.1 F (36.7 C)   Wt Readings from Last 3 Encounters:  12/20/16 153 lb (69.4 kg)  12/05/16 157 lb (71.2 kg)  11/01/16 160 lb (72.6 kg)   Body mass index is 21.95 kg/m.   Physical Exam    Constitutional: Appears well-developed and well-nourished. No distress.  HENT:  Head: Normocephalic and atraumatic.  Neck: Neck supple. No tracheal deviation present. No thyromegaly present.  No cervical lymphadenopathy Cardiovascular: Normal rate, regular rhythm and normal heart sounds.   3/6 systolic murmur heard. No carotid bruit .  No edema Pulmonary/Chest: Effort normal and breath sounds normal. No respiratory distress. No has no wheezes. No rales.  Skin: Skin is warm and dry. Not diaphoretic.  Psychiatric: Normal mood and affect. Behavior is normal.      Assessment & Plan:    See Problem List for Assessment and Plan of chronic medical problems.

## 2016-12-20 ENCOUNTER — Encounter: Payer: Self-pay | Admitting: Internal Medicine

## 2016-12-20 ENCOUNTER — Ambulatory Visit (INDEPENDENT_AMBULATORY_CARE_PROVIDER_SITE_OTHER): Payer: Medicare Other | Admitting: Internal Medicine

## 2016-12-20 VITALS — BP 104/64 | HR 55 | Temp 98.1°F | Resp 16 | Wt 153.0 lb

## 2016-12-20 DIAGNOSIS — R0989 Other specified symptoms and signs involving the circulatory and respiratory systems: Secondary | ICD-10-CM | POA: Diagnosis not present

## 2016-12-20 DIAGNOSIS — Z23 Encounter for immunization: Secondary | ICD-10-CM | POA: Diagnosis not present

## 2016-12-20 DIAGNOSIS — F419 Anxiety disorder, unspecified: Secondary | ICD-10-CM | POA: Diagnosis not present

## 2016-12-20 MED ORDER — VALSARTAN 80 MG PO TABS: 40.0000 mg | ORAL_TABLET | Freq: Every day | ORAL | 11 refills | Status: DC

## 2016-12-20 NOTE — Patient Instructions (Addendum)
Start valium 1 mg daily for two weeks and then decrease to 0.5 mg daily Take extra valium if needed  Take 40 mg of diovan in the morning.  Monitor your bp at home - your goal is less than 130/80 but we do not want it to be too low.    pneumovax pneumonia vaccine given today.

## 2016-12-20 NOTE — Assessment & Plan Note (Signed)
Start valium 1 mg daily for two weeks and then decrease to 0.5 mg daily Take extra valium if needed

## 2017-01-16 ENCOUNTER — Other Ambulatory Visit: Payer: Self-pay | Admitting: Internal Medicine

## 2017-01-29 ENCOUNTER — Telehealth: Payer: Self-pay | Admitting: Emergency Medicine

## 2017-01-29 DIAGNOSIS — F411 Generalized anxiety disorder: Secondary | ICD-10-CM

## 2017-01-29 MED ORDER — DIAZEPAM 2 MG PO TABS: 2.0000 mg | ORAL_TABLET | ORAL | 1 refills | Status: DC | PRN

## 2017-01-29 NOTE — Telephone Encounter (Signed)
RX faxed to [facility].

## 2017-02-05 MED ORDER — DIAZEPAM 2 MG PO TABS: ORAL_TABLET | ORAL | 1 refills | Status: DC

## 2017-02-05 NOTE — Addendum Note (Signed)
Addended by: Terence Lux B on: 02/05/2017 04:50 PM   Modules accepted: Orders

## 2017-02-05 NOTE — Telephone Encounter (Signed)
new rx printed and faxed to [facility]

## 2017-02-05 NOTE — Telephone Encounter (Signed)
[facility] called pt and said that they are missing some info on the script and needs [place] to come them

## 2017-05-14 ENCOUNTER — Ambulatory Visit (INDEPENDENT_AMBULATORY_CARE_PROVIDER_SITE_OTHER): Payer: Medicare Other

## 2017-05-14 DIAGNOSIS — Z23 Encounter for immunization: Secondary | ICD-10-CM | POA: Diagnosis not present

## 2017-06-28 NOTE — Progress Notes (Signed)
Subjective:    Patient ID: Edward Mccall, male    DOB: [birth year], 71 y.o.   MRN: [id number]  HPI He is here for a physical exam.   BP has been overall controlled.  He monitors it regularly.    He has no concerns.    Medications and allergies reviewed with patient and updated if appropriate.  Patient Active Problem List   Diagnosis Date Noted  . Bradycardia 12/05/2016  . Labile hypertension 12/05/2016  . History of colonic polyps 09/16/2014  . Degeneration of intervertebral disc of lumbosacral region 10/29/2011  . Lipoma 04/06/2011  . Anxiety 01/02/2011  . Spinal stenosis 01/02/2011  . PROSTATE CANCER, HX OF 06/20/2010  . MITRAL REGURGITATION 11/15/2008  . Supraventricular premature beats 11/15/2008  . [place] SYNDROME 10/21/2008  . Fasting hyperglycemia 10/21/2008  . Nonspecific abnormal electrocardiogram (ECG) (EKG) 03/25/2007    Current Outpatient Medications on File Prior to Visit  Medication Sig Dispense Refill  . diazepam (VALIUM) 2 MG tablet Take 1 tablet by mouth daily as needed for anxiety 90 tablet 1  . mirtazapine (REMERON) 7.5 MG tablet TAKE 1 TABLET BY MOUTH AT  BEDTIME 90 tablet 3  . Multiple Vitamin (MULTIVITAMIN) capsule Take 1 capsule by mouth daily.      . Omega-3 Fatty Acids (FISH OIL) 1000 MG CAPS Take 1,000 mg by mouth 2 (two) times daily.      No current facility-administered medications on file prior to visit.     Past Medical History:  Diagnosis Date  . Anxiety 01/02/2011  . GERD 05/24/2008  . 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 Gynecomastia   . Gynecomastia, male 07/22/2012  . History of colonic polyps 09/16/2014  . MITRAL REGURGITATION 11/15/2008   Mild to moderate by echo 2010  . Prostate cancer [facility])    Dr Alinda Money  . Spinal stenosis 01/02/2011    Past Surgical History:  Procedure Laterality Date  . colonoscopy with polypectomy  1997   neg since; Dr Peters, [place]  . LUMBAR DISC SURGERY  11/2010   L4-5 discectomy &  laminectomy, Dr Ellene Route   . LUMBAR DISC SURGERY  01/2011   L4-5 discectomy,Dr Elsner  . LUMBAR DISC SURGERY  09/2011   L4-5 minimally invasive fusion , Dr Myrene Buddy, [facility]  . PROSTATECTOMY  2011  . TOOTH EXTRACTION      Social History   Socioeconomic History  . Marital status: Married    Spouse name: None  . Number of children: 2  . Years of education: None  . Highest education level: None  Social Needs  . Financial resource strain: None  . Food insecurity - worry: None  . Food insecurity - inability: None  . Transportation needs - medical: None  . Transportation needs - non-medical: None  Occupational History  . Occupation: Retired-works part time at [employer]: [place]  Tobacco Use  . Smoking status: Former Smoker    Packs/day: 1.00    Years: 10.00    Pack years: 10.00    Types: Cigarettes    Last attempt to quit: 08/12/1973    Years since quitting: 43.9  . Smokeless tobacco: Former Systems developer    Types: Chew  . Tobacco comment: smoked 1960-1975, up to 1 ppd  Substance and Sexual Activity  . Alcohol use: Yes    Alcohol/week: 4.2 oz    Types: 7 Standard drinks or equivalent per week    Comment:  < 1/day  . Drug  use: No  . Sexual activity: None  Other Topics Concern  . None  Social History Narrative  . None    Family History  Problem Relation Age of Onset  . Anxiety disorder Mother   . Diabetes Mother        not definite  . Bladder Cancer Mother   . Anxiety disorder Son   . Anxiety disorder Brother   . Alcohol abuse Father   . CAD Neg Hx   . Stroke Neg Hx   . Heart disease Neg Hx     Review of Systems  Constitutional: Negative for chills and fever.  Eyes: Negative for visual disturbance.  Respiratory: Negative for cough, shortness of breath and wheezing.   Cardiovascular: Positive for palpitations (occ). Negative for chest pain and leg swelling.  Gastrointestinal: Negative for abdominal pain, constipation, diarrhea and nausea.       No gerd    Genitourinary: Negative for dysuria and hematuria.  Musculoskeletal: Positive for arthralgias (hip pain). Negative for back pain.  Skin: Negative for color change and rash.  Neurological: Negative for light-headedness and headaches.  Psychiatric/Behavioral: Negative for dysphoric mood. The patient is nervous/anxious.        Objective:   Vitals:   06/30/17 0905  BP: (!) 150/90  Pulse: (!) 55  Resp: 16  Temp: 97.8 F (36.6 C)  SpO2: 98%   Filed Weights   06/30/17 0905  Weight: 159 lb (72.1 kg)   Body mass index is 22.81 kg/m.  Wt Readings from Last 3 Encounters:  06/30/17 159 lb (72.1 kg)  12/20/16 153 lb (69.4 kg)  12/05/16 157 lb (71.2 kg)     Physical Exam Constitutional: He appears well-developed and well-nourished. No distress.  HENT:  Head: Normocephalic and atraumatic.  Right Ear: External ear normal.  Left Ear: External ear normal.  Mouth/Throat: Oropharynx is clear and moist.  Normal ear canals and TM b/l  Eyes: Conjunctivae and EOM are normal.  Neck: Neck supple. No tracheal deviation present. No thyromegaly present.  No carotid bruit  Cardiovascular: Normal rate, regular rhythm, normal heart sounds and intact distal pulses.   3/6  murmur heard. Pulmonary/Chest: Effort normal and breath sounds normal. No respiratory distress. He has no wheezes. He has no rales.  Abdominal: Soft. He exhibits no distension. There is no tenderness.  Genitourinary: deferred  Musculoskeletal: He exhibits no edema.  Lymphadenopathy:   He has no cervical adenopathy.  Skin: Skin is warm and dry. He is not diaphoretic.  Psychiatric: He has a normal mood and affect. His behavior is normal.         Assessment & Plan:   Physical exam: Screening blood work  ordered Immunizations   Up to date, discussed shingrix Colonoscopy   Up to date  Eye exams  Up to date  EKG  Last done 08/2016   Exercise  Regular 6/week Weight   Normal BMI Skin   No concerns Substance abuse    none  See Problem List for Assessment and Plan of chronic medical problems.   FU in 6 months

## 2017-06-28 NOTE — Patient Instructions (Addendum)
Test(s) ordered today. Your results will be released to MyChart (or called to you) after review, usually within 72hours after test completion. If any changes need to be made, you will be notified at that same time.  All other Health Maintenance issues reviewed.   All recommended immunizations and age-appropriate screenings are up-to-date or discussed.  No immunizations administered today.   Medications reviewed and updated.  No changes recommended at this time.   Please followup in 6 months    Health Maintenance, Male A healthy lifestyle and preventive care is important for your health and wellness. Ask your health care provider about what schedule of regular examinations is right for you. What should I know about weight and diet? Eat a Healthy Diet  Eat plenty of vegetables, fruits, whole grains, low-fat dairy products, and lean protein.  Do not eat a lot of foods high in solid fats, added sugars, or salt.  Maintain a Healthy Weight Regular exercise can help you achieve or maintain a healthy weight. You should:  Do at least 150 minutes of exercise each week. The exercise should increase your heart rate and make you sweat (moderate-intensity exercise).  Do strength-training exercises at least twice a week.  Watch Your Levels of Cholesterol and Blood Lipids  Have your blood tested for lipids and cholesterol every 5 years starting at 72 years of age. If you are at high risk for heart disease, you should start having your blood tested when you are 72 years old. You may need to have your cholesterol levels checked more often if: ? Your lipid or cholesterol levels are high. ? You are older than 72 years of age. ? You are at high risk for heart disease.  What should I know about cancer screening? Many types of cancers can be detected early and may often be prevented. Lung Cancer  You should be screened every year for lung cancer if: ? You are a current smoker who has smoked for  at least 30 years. ? You are a former smoker who has quit within the past 15 years.  Talk to your health care provider about your screening options, when you should start screening, and how often you should be screened.  Colorectal Cancer  Routine colorectal cancer screening usually begins at 72 years of age and should be repeated every 5-10 years until you are 72 years old. You may need to be screened more often if early forms of precancerous polyps or small growths are found. Your health care provider may recommend screening at an earlier age if you have risk factors for colon cancer.  Your health care provider may recommend using home test kits to check for hidden blood in the stool.  A small camera at the end of a tube can be used to examine your colon (sigmoidoscopy or colonoscopy). This checks for the earliest forms of colorectal cancer.  Prostate and Testicular Cancer  Depending on your age and overall health, your health care provider may do certain tests to screen for prostate and testicular cancer.  Talk to your health care provider about any symptoms or concerns you have about testicular or prostate cancer.  Skin Cancer  Check your skin from head to toe regularly.  Tell your health care provider about any new moles or changes in moles, especially if: ? There is a change in a mole's size, shape, or color. ? You have a mole that is larger than a pencil eraser.  Always use sunscreen. Apply sunscreen liberally   and repeat throughout the day.  Protect yourself by wearing long sleeves, pants, a wide-brimmed hat, and sunglasses when outside.  What should I know about heart disease, diabetes, and high blood pressure?  If you are 18-39 years of age, have your blood pressure checked every 3-5 years. If you are 40 years of age or older, have your blood pressure checked every year. You should have your blood pressure measured twice-once when you are at a hospital or clinic, and once  when you are not at a hospital or clinic. Record the average of the two measurements. To check your blood pressure when you are not at a hospital or clinic, you can use: ? An automated blood pressure machine at a pharmacy. ? A home blood pressure monitor.  Talk to your health care provider about your target blood pressure.  If you are between 45-79 years old, ask your health care provider if you should take aspirin to prevent heart disease.  Have regular diabetes screenings by checking your fasting blood sugar level. ? If you are at a normal weight and have a low risk for diabetes, have this test once every three years after the age of 45. ? If you are overweight and have a high risk for diabetes, consider being tested at a younger age or more often.  A one-time screening for abdominal aortic aneurysm (AAA) by ultrasound is recommended for men aged 65-75 years who are current or former smokers. What should I know about preventing infection? Hepatitis B If you have a higher risk for hepatitis B, you should be screened for this virus. Talk with your health care provider to find out if you are at risk for hepatitis B infection. Hepatitis C Blood testing is recommended for:  Everyone born from 1945 through 1965.  Anyone with known risk factors for hepatitis C.  Sexually Transmitted Diseases (STDs)  You should be screened each year for STDs including gonorrhea and chlamydia if: ? You are sexually active and are younger than 72 years of age. ? You are older than 72 years of age and your health care provider tells you that you are at risk for this type of infection. ? Your sexual activity has changed since you were last screened and you are at an increased risk for chlamydia or gonorrhea. Ask your health care provider if you are at risk.  Talk with your health care provider about whether you are at high risk of being infected with HIV. Your health care provider may recommend a prescription  medicine to help prevent HIV infection.  What else can I do?  Schedule regular health, dental, and eye exams.  Stay current with your vaccines (immunizations).  Do not use any tobacco products, such as cigarettes, chewing tobacco, and e-cigarettes. If you need help quitting, ask your health care provider.  Limit alcohol intake to no more than 2 drinks per day. One drink equals 12 ounces of beer, 5 ounces of wine, or 1 ounces of hard liquor.  Do not use street drugs.  Do not share needles.  Ask your health care provider for help if you need support or information about quitting drugs.  Tell your health care provider if you often feel depressed.  Tell your health care provider if you have ever been abused or do not feel safe at home. This information is not intended to replace advice given to you by your health care provider. Make sure you discuss any questions you have with your health   care provider. Document Released: 01/25/2008 Document Revised: 03/27/2016 Document Reviewed: 05/02/2015 Elsevier Interactive Patient Education  2018 Elsevier Inc.  

## 2017-06-28 NOTE — Assessment & Plan Note (Addendum)
Check a1c Not compliant with a low sugar diet Exercises regularly

## 2017-06-30 ENCOUNTER — Ambulatory Visit (INDEPENDENT_AMBULATORY_CARE_PROVIDER_SITE_OTHER): Payer: Medicare Other | Admitting: Internal Medicine

## 2017-06-30 ENCOUNTER — Encounter: Payer: Self-pay | Admitting: Internal Medicine

## 2017-06-30 ENCOUNTER — Other Ambulatory Visit (INDEPENDENT_AMBULATORY_CARE_PROVIDER_SITE_OTHER): Payer: Medicare Other

## 2017-06-30 VITALS — BP 150/90 | HR 55 | Temp 97.8°F | Resp 16 | Ht 70.0 in | Wt 159.0 lb

## 2017-06-30 DIAGNOSIS — R7301 Impaired fasting glucose: Secondary | ICD-10-CM

## 2017-06-30 DIAGNOSIS — I080 Rheumatic disorders of both mitral and aortic valves: Secondary | ICD-10-CM

## 2017-06-30 DIAGNOSIS — Z0000 Encounter for general adult medical examination without abnormal findings: Secondary | ICD-10-CM

## 2017-06-30 DIAGNOSIS — R030 Elevated blood-pressure reading, without diagnosis of hypertension: Secondary | ICD-10-CM

## 2017-06-30 DIAGNOSIS — E291 Testicular hypofunction: Secondary | ICD-10-CM | POA: Diagnosis not present

## 2017-06-30 DIAGNOSIS — Z8546 Personal history of malignant neoplasm of prostate: Secondary | ICD-10-CM | POA: Diagnosis not present

## 2017-06-30 DIAGNOSIS — F419 Anxiety disorder, unspecified: Secondary | ICD-10-CM

## 2017-06-30 DIAGNOSIS — R0989 Other specified symptoms and signs involving the circulatory and respiratory systems: Secondary | ICD-10-CM | POA: Diagnosis not present

## 2017-06-30 LAB — CBC WITH DIFFERENTIAL/PLATELET
Basophils Absolute: 0 10*3/uL (ref 0.0–0.1)
Basophils Relative: 1.2 % (ref 0.0–3.0)
EOS PCT: 1.5 % (ref 0.0–5.0)
Eosinophils Absolute: 0.1 10*3/uL (ref 0.0–0.7)
HCT: 43.3 % (ref 39.0–52.0)
Hemoglobin: 14.7 g/dL (ref 13.0–17.0)
LYMPHS ABS: 0.9 10*3/uL (ref 0.7–4.0)
Lymphocytes Relative: 22.4 % (ref 12.0–46.0)
MCHC: 33.9 g/dL (ref 30.0–36.0)
MCV: 93.6 fl (ref 78.0–100.0)
MONO ABS: 0.3 10*3/uL (ref 0.1–1.0)
MONOS PCT: 6.8 % (ref 3.0–12.0)
NEUTROS ABS: 2.7 10*3/uL (ref 1.4–7.7)
NEUTROS PCT: 68.1 % (ref 43.0–77.0)
PLATELETS: 215 10*3/uL (ref 150.0–400.0)
RBC: 4.63 Mil/uL (ref 4.22–5.81)
RDW: 13.3 % (ref 11.5–15.5)
WBC: 4 10*3/uL (ref 4.0–10.5)

## 2017-06-30 LAB — COMPREHENSIVE METABOLIC PANEL
ALT: 21 U/L (ref 0–53)
AST: 24 U/L (ref 0–37)
Albumin: 4.4 g/dL (ref 3.5–5.2)
Alkaline Phosphatase: 63 U/L (ref 39–117)
BILIRUBIN TOTAL: 1 mg/dL (ref 0.2–1.2)
BUN: 16 mg/dL (ref 6–23)
CO2: 32 meq/L (ref 19–32)
Calcium: 9.8 mg/dL (ref 8.4–10.5)
Chloride: 102 mEq/L (ref 96–112)
Creatinine, Ser: 0.85 mg/dL (ref 0.40–1.50)
GFR: 94.15 mL/min (ref >60.00)
GLUCOSE: 118 mg/dL — AB (ref 70–99)
POTASSIUM: 4.4 meq/L (ref 3.5–5.1)
SODIUM: 140 meq/L (ref 135–145)
Total Protein: 6.8 g/dL (ref 6.0–8.3)

## 2017-06-30 LAB — HEMOGLOBIN A1C: HEMOGLOBIN A1C: 5.7 % (ref 4.6–6.5)

## 2017-06-30 LAB — TSH: TSH: 1.32 u[IU]/mL (ref 0.35–4.50)

## 2017-06-30 LAB — LIPID PANEL
CHOLESTEROL: 213 mg/dL — AB (ref 0–200)
HDL: 96.8 mg/dL (ref >39.00)
LDL Cholesterol: 105 mg/dL — ABNORMAL HIGH (ref 0–99)
NONHDL: 115.89
Total CHOL/HDL Ratio: 2
Triglycerides: 54 mg/dL (ref 0.0–149.0)
VLDL: 10.8 mg/dL (ref 0.0–40.0)

## 2017-06-30 LAB — PSA, MEDICARE: PSA: 0.04 ng/ml — ABNORMAL LOW (ref 0.10–4.00)

## 2017-06-30 NOTE — Assessment & Plan Note (Signed)
BP elevated here today and has been elevated in the past Not currently on medication Controlled at home He will continue to monitor

## 2017-06-30 NOTE — Assessment & Plan Note (Signed)
No longer following with urology Will check psa today and send to urology

## 2017-06-30 NOTE — Assessment & Plan Note (Signed)
Has decreased valium dose - takes 0.5 mg - 2 mg as needed Taking remeron Overall controlled Continue above

## 2017-06-30 NOTE — Assessment & Plan Note (Addendum)
Will recheck Would need to see endo or urology for replacement if he wants it

## 2017-06-30 NOTE — Assessment & Plan Note (Signed)
Stable Follows with cardiology annually

## 2017-07-05 ENCOUNTER — Encounter: Payer: Self-pay | Admitting: Internal Medicine

## 2017-07-07 LAB — TESTOSTERONE, FREE & TOTAL
Free Testosterone: 15.5 pg/mL — ABNORMAL LOW (ref 30.0–135.0)
TESTOSTERONE, TOTAL, LC-MS-MS: 338 ng/dL (ref 250–1100)

## 2017-07-09 ENCOUNTER — Encounter: Payer: Self-pay | Admitting: Internal Medicine

## 2017-11-14 ENCOUNTER — Ambulatory Visit: Payer: Self-pay | Admitting: Internal Medicine

## 2017-12-19 ENCOUNTER — Ambulatory Visit: Payer: Self-pay | Admitting: Cardiovascular Disease

## 2017-12-24 ENCOUNTER — Ambulatory Visit: Payer: Self-pay | Admitting: Cardiovascular Disease

## 2017-12-30 NOTE — Patient Instructions (Addendum)
  Medications reviewed and updated.  No changes recommended at this time.  Your prescription(s) have been submitted to your pharmacy. Please take as directed and contact our office if you believe you are having problem(s) with the medication(s).    Please followup in 6 months   

## 2017-12-30 NOTE — Progress Notes (Signed)
Subjective:    Patient ID: Edward Mccall, male    DOB: Oct 02, 1945, 72 y.o.   MRN: [id number]  HPI The patient is here for follow up.  Prediabetes:  He is compliant with a low sugar/carbohydrate diet.  He is exercising regularly - exercises daily - weights, walks, rides his bike miles.  Anxiety: He is taking the valium only as needed as prescribed. He denies any side effects from the medication. He tries not to take the medication, but when he takes it - it works.  He is trying to get off the medication.  He tries not to take it.  He exercises intensely on a daily basis and that helps a lot.  His goal is to get off the medication.   He is taking the remeron nightly.  Initially it helped with sleep, but he does not think it helps anymore.  He is unsure if he needs it.  He may try to get off of that as well.     H/o elevated BP:  His bp is elevated in the morning and with anxiety, but after exercise it is on the low side.  He eats healthy and is compliant with a low sodium diet.  He exercises daily.   MR:  He sees cardiology annually.  He denies chest pain, palpitations and SOB outside of anxiety.  He exercises intensely without symptoms.   Medications and allergies reviewed with patient and updated if appropriate.  Patient Active Problem List   Diagnosis Date Noted  . Testosterone deficiency in male 06/30/2017  . Bradycardia 12/05/2016  . History of colonic polyps 09/16/2014  . Degeneration of intervertebral disc of lumbosacral region 10/29/2011  . Elevated blood pressure reading 06/25/2011  . Lipoma 04/06/2011  . Anxiety 01/02/2011  . Spinal stenosis 01/02/2011  . PROSTATE CANCER, HX OF 06/20/2010  . MITRAL REGURGITATION 11/15/2008  . Supraventricular premature beats 11/15/2008  . [place] SYNDROME 10/21/2008  . Prediabetes 10/21/2008  . Nonspecific abnormal electrocardiogram (ECG) (EKG) 03/25/2007    Current Outpatient Medications on File Prior to Visit  Medication Sig  Dispense Refill  . Cholecalciferol (VITAMIN D3) 25 MCG TABS Take 25 mcg by mouth daily.    . diazepam (VALIUM) 2 MG tablet Take 1 tablet by mouth daily as needed for anxiety 90 tablet 1  . mirtazapine (REMERON) 7.5 MG tablet TAKE 1 TABLET BY MOUTH AT  BEDTIME 90 tablet 3  . Multiple Vitamin (MULTIVITAMIN) capsule Take 1 capsule by mouth daily.      . Omega-3 Fatty Acids (FISH OIL) 1000 MG CAPS Take 1,000 mg by mouth 2 (two) times daily.      No current facility-administered medications on file prior to visit.     Past Medical History:  Diagnosis Date  . Anxiety 01/02/2011  . GERD 05/24/2008  . 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 Gynecomastia   . Gynecomastia, male 07/22/2012  . History of colonic polyps 09/16/2014  . MITRAL REGURGITATION 11/15/2008   Mild to moderate by echo 2010  . Prostate cancer [facility])    Dr Alinda Money  . Spinal stenosis 01/02/2011    Past Surgical History:  Procedure Laterality Date  . colonoscopy with polypectomy  1997   neg since; Dr Peters, [place]  . LUMBAR DISC SURGERY  11/2010   L4-5 discectomy & laminectomy, Dr Ellene Route   . LUMBAR DISC SURGERY  01/2011   L4-5 discectomy,Dr Elsner  . LUMBAR DISC SURGERY  09/2011   L4-5 minimally invasive fusion ,  Dr Myrene Buddy, [facility]  . PROSTATECTOMY  2011  . TOOTH EXTRACTION      Social History   Socioeconomic History  . Marital status: Married    Spouse name: Not on file  . Number of children: 2  . Years of education: Not on file  . Highest education level: Not on file  Occupational History  . Occupation: Retired-works part time at [employer]: [employer]  . Financial resource strain: Not on file  . Food insecurity:    Worry: Not on file    Inability: Not on file  . Transportation needs:    Medical: Not on file    Non-medical: Not on file  Tobacco Use  . Smoking status: Former Smoker    Packs/day: 1.00    Years: 10.00    Pack years: 10.00    Types: Cigarettes    Last  attempt to quit: 08/12/1973    Years since quitting: 44.4  . Smokeless tobacco: Former Systems developer    Types: Chew  . Tobacco comment: smoked 1960-1975, up to 1 ppd  Substance and Sexual Activity  . Alcohol use: Yes    Alcohol/week: 4.2 oz    Types: 7 Standard drinks or equivalent per week    Comment:  < 1/day  . Drug use: No  . Sexual activity: Not on file  Lifestyle  . Physical activity:    Days per week: Not on file    Minutes per session: Not on file  . Stress: Not on file  Relationships  . Social connections:    Talks on phone: Not on file    Gets together: Not on file    Attends religious service: Not on file    Active member of club or organization: Not on file    Attends meetings of clubs or organizations: Not on file    Relationship status: Not on file  Other Topics Concern  . Not on file  Social History Narrative  . Not on file    Family History  Problem Relation Age of Onset  . Anxiety disorder Mother   . Diabetes Mother        not definite  . Bladder Cancer Mother   . Anxiety disorder Son   . Anxiety disorder Brother   . Atrial fibrillation Brother   . Alcohol abuse Father   . CAD Neg Hx   . Stroke Neg Hx   . Heart disease Neg Hx     Review of Systems  Constitutional: Negative for chills and fever.  Respiratory: Negative for cough, shortness of breath and wheezing.   Cardiovascular: Positive for chest pain (occ with anxiety) and palpitations (occ with anxiety). Negative for leg swelling.  Gastrointestinal: Positive for nausea (occ with anxiety).  Neurological: Positive for headaches (occ with anxiety). Negative for light-headedness.       Objective:   Vitals:   12/31/17 0830  BP: 134/72  Pulse: (!) 57  Resp: 16  Temp: 98.1 F (36.7 C)  SpO2: 97%   BP Readings from Last 3 Encounters:  12/31/17 134/72  06/30/17 (!) 150/90  12/20/16 104/64   Wt Readings from Last 3 Encounters:  12/31/17 152 lb (68.9 kg)  06/30/17 159 lb (72.1 kg)  12/20/16 153  lb (69.4 kg)   Body mass index is 21.81 kg/m.   Physical Exam    Constitutional: Appears well-developed and well-nourished. No distress.  HENT:  Head: Normocephalic and atraumatic.  Neck: Neck  supple. No tracheal deviation present. No thyromegaly present.  No cervical lymphadenopathy Cardiovascular: Normal rate, regular rhythm and normal heart sounds.   2/6 systolic RSB murmur heard. No carotid bruit .  No edema Pulmonary/Chest: Effort normal and breath sounds normal. No respiratory distress. No has no wheezes. No rales.  Skin: Skin is warm and dry. Not diaphoretic.  Psychiatric: Normal mood and affect. Behavior is normal.      Assessment & Plan:    See Problem List for Assessment and Plan of chronic medical problems.  `

## 2017-12-31 ENCOUNTER — Ambulatory Visit (INDEPENDENT_AMBULATORY_CARE_PROVIDER_SITE_OTHER): Payer: Medicare Other | Admitting: Internal Medicine

## 2017-12-31 ENCOUNTER — Encounter: Payer: Self-pay | Admitting: Internal Medicine

## 2017-12-31 VITALS — BP 134/72 | HR 57 | Temp 98.1°F | Resp 16 | Ht 70.0 in | Wt 152.0 lb

## 2017-12-31 DIAGNOSIS — I080 Rheumatic disorders of both mitral and aortic valves: Secondary | ICD-10-CM

## 2017-12-31 DIAGNOSIS — F419 Anxiety disorder, unspecified: Secondary | ICD-10-CM

## 2017-12-31 DIAGNOSIS — R030 Elevated blood-pressure reading, without diagnosis of hypertension: Secondary | ICD-10-CM | POA: Diagnosis not present

## 2017-12-31 DIAGNOSIS — R7303 Prediabetes: Secondary | ICD-10-CM | POA: Diagnosis not present

## 2017-12-31 MED ORDER — MIRTAZAPINE 7.5 MG PO TABS: 7.5000 mg | ORAL_TABLET | Freq: Every day | ORAL | 3 refills | Status: DC

## 2017-12-31 NOTE — Assessment & Plan Note (Signed)
Intermittent anxiety Taking remeron nightly - ? Helping and ? Need for medication - he may try to stop it and see how he does Taking valium as needed only - can go several days without it and is trying to avoid and would like to discontinue completely - he is doing everything naturally for his anxiety - no obvious triggers for anxiety Ok to continue valium as needed - he will keep it to a minimum and try not to take

## 2017-12-31 NOTE — Assessment & Plan Note (Signed)
Asymptomatic Sees cardiology annually

## 2017-12-31 NOTE — Assessment & Plan Note (Signed)
Stable over years Very healthy lifestyle - eats healthy, exercises daily Will check a1c in 6 months

## 2017-12-31 NOTE — Assessment & Plan Note (Signed)
Monitors it at home - intermittently elevated at home with anxiety or in the morning Overall controlled - no need for medication He will continue to monitor it at home and continue healthy lifestyle

## 2018-01-19 ENCOUNTER — Ambulatory Visit: Payer: Self-pay | Admitting: Cardiovascular Disease

## 2018-01-20 IMAGING — US US THYROID
1 series · 13 of 25 positions shown · non-contrast
Comparison: None.

CLINICAL DATA: Palpable abnormality. Right-sided thyroid mass for
the past 3 days.

EXAM:
THYROID ULTRASOUND
TECHNIQUE: Ultrasound examination of the thyroid gland and adjacent soft
tissues was performed.

[Series 1: us thyroid · 0.06mm/px · 13 of 70 slices shown]
[im 1/70]
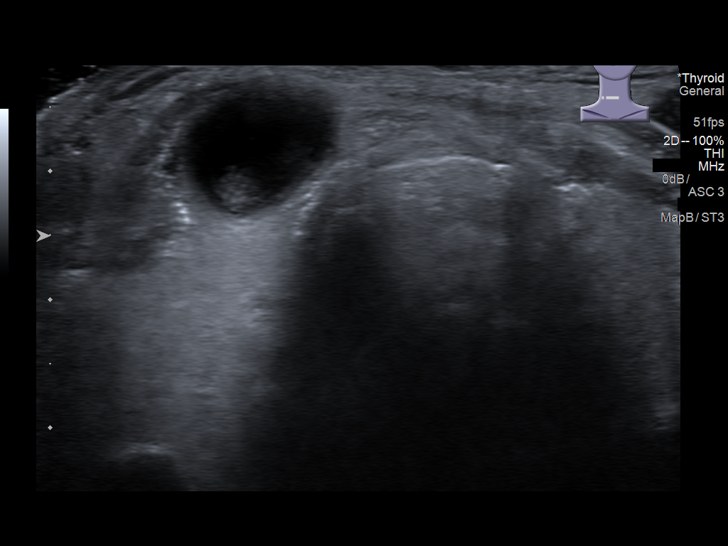
[im 6/70]
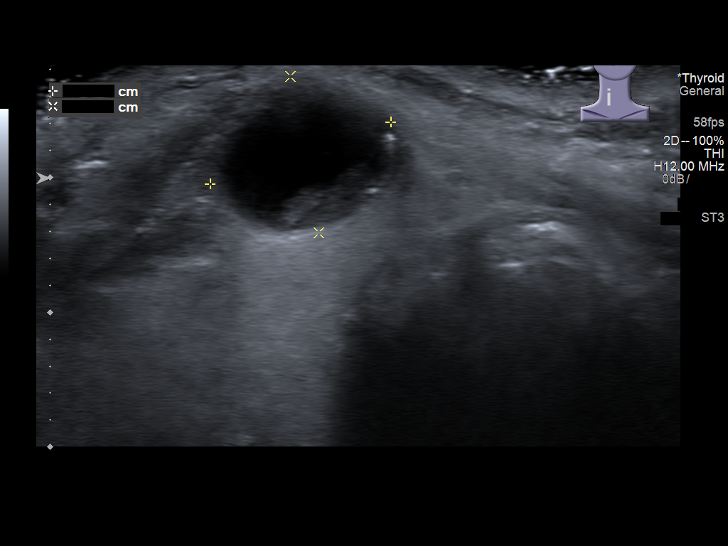
[im 12/70]
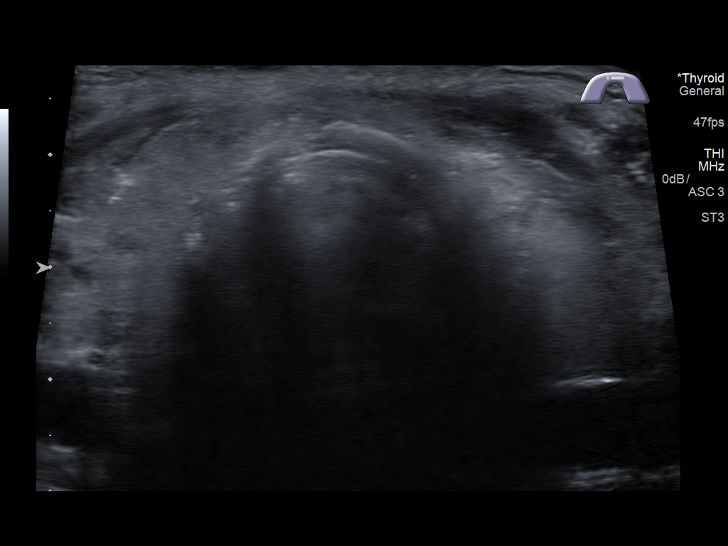
[im 18/70]
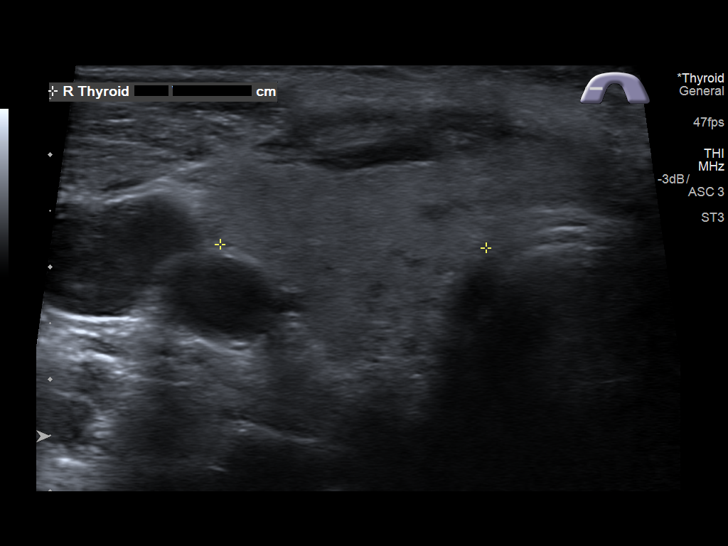
[im 24/70]
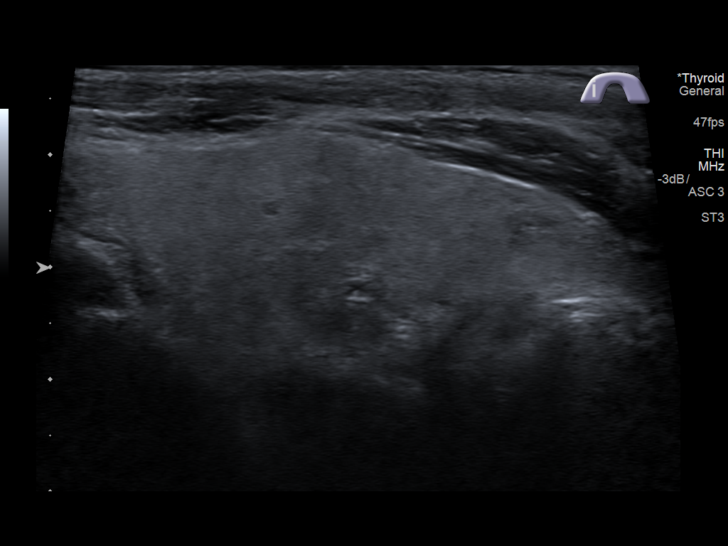
[im 29/70]
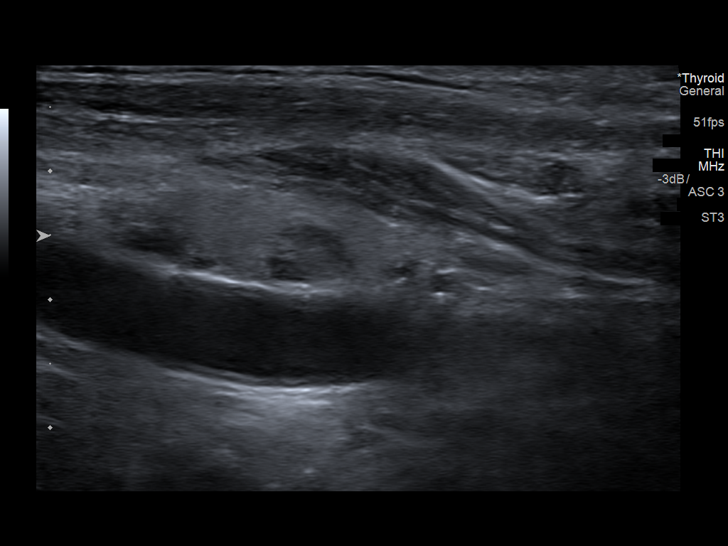
[im 35/70]
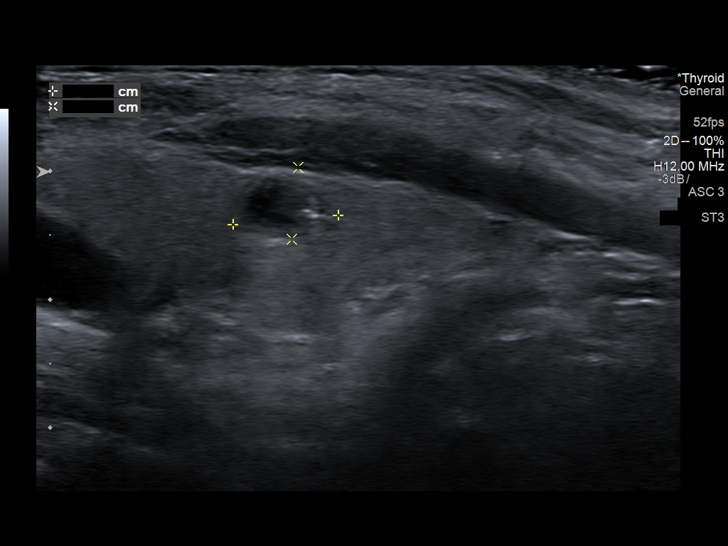
[im 41/70]
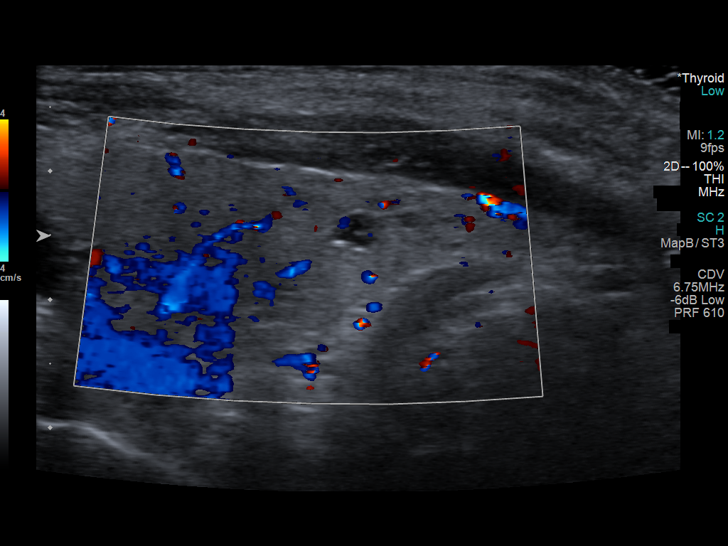
[im 47/70]
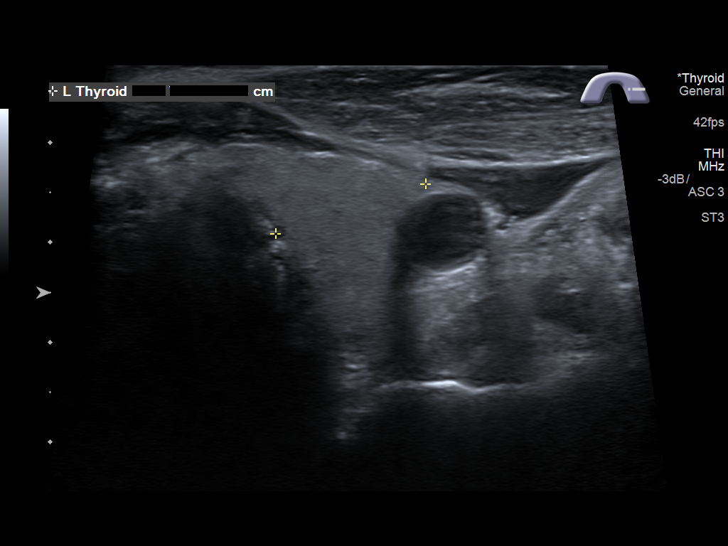
[im 52/70]
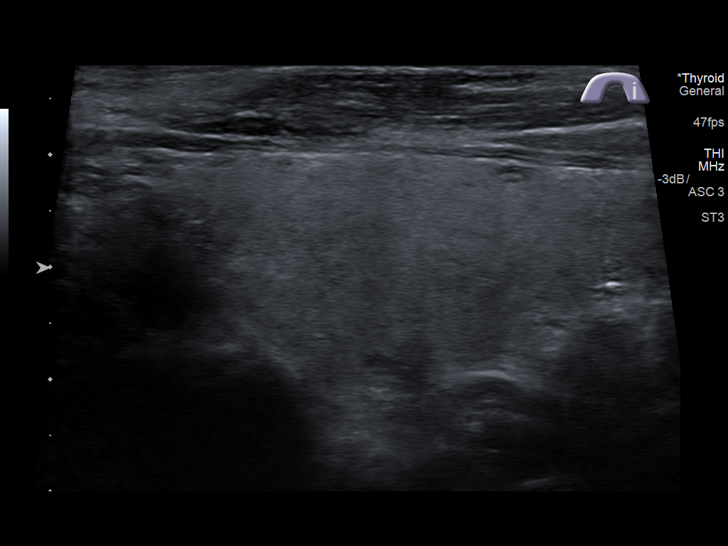
[im 58/70]
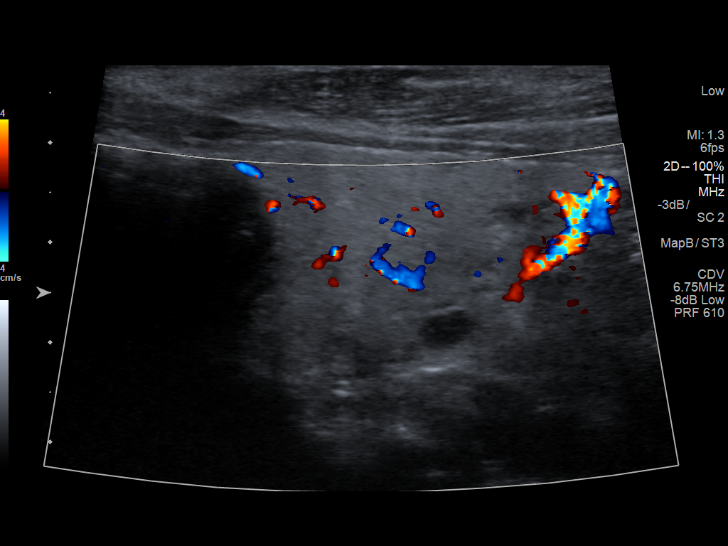
[im 64/70]
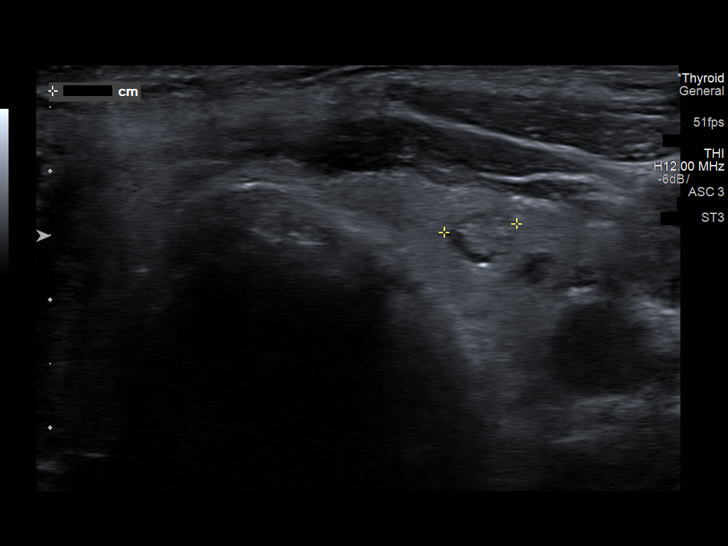
[im 70/70]
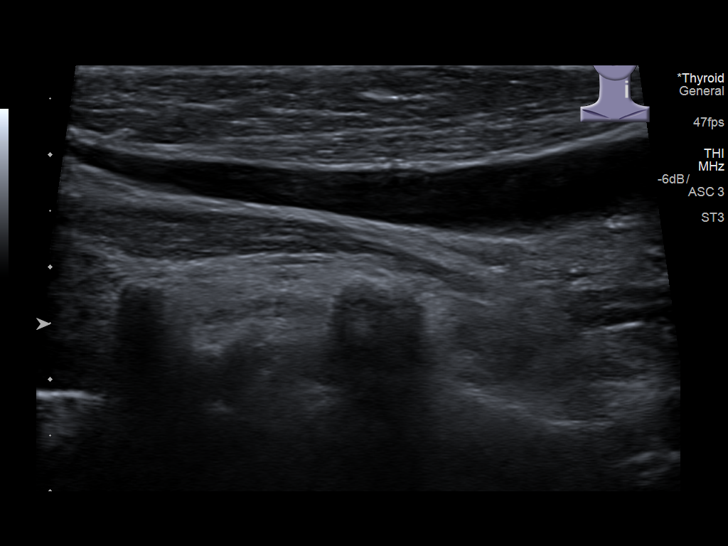

[13 of 25 positions shown; findings below may reference images not displayed]

FINDINGS: Parenchymal Echotexture: Mildly heterogenous

Isthmus: Normal in size measuring 0.4 cm in diameter

Right lobe: Normal in size measuring 5.0 x 2.5 x 2.4 cm

Left lobe: Normal in size measuring 5.2 x 2.4 x 1.6 cm

_________________________________________________________

Estimated total number of nodules >/= 1 cm: 0

Number of spongiform nodules >/=  2 cm not described below (TR1): 0

Number of mixed cystic and solid nodules >/= 1.5 cm not described
below (TR2): 0

_________________________________________________________

Nodule # 1:

Location: Isthmus; Mid

Maximum size: 1.4 cm; Other 2 dimensions: 1.2 x 1.2 cm

Composition: cystic/almost completely cystic (0)

Echogenicity: anechoic (0)

Shape: not taller-than-wide (0)

Margins: smooth (0)

Echogenic foci: large comet-tail artifacts (0)

ACR TI-RADS total points: 0.

ACR TI-RADS risk category: TR1 (0-1 points).

ACR TI-RADS recommendations:

This nodule does NOT meet TI-RADS criteria for biopsy or dedicated
follow-up.

_________________________________________________________

There are 3 punctate (sub 8 mm) nodules scattered within the right
lobe of the thyroid, none of which meet imaging criteria to
recommend percutaneous sampling or dedicated follow-up.

There are 2 punctate (sub 6 mm) nodules scattered within the left
lobe of the thyroid neither of which meet imaging criteria to
recommend percutaneous sampling or dedicated follow-up.
IMPRESSION: 1. Findings suggestive of multinodular goiter.
2. The patient's palpable area of concern appears to correlate with
an approximately 1.4 cm minimally complex cyst arising from the
thyroid isthmus. Neither this cyst or any of the additional
scattered punctate (sub 8 mm) bilateral thyroid nodules meet imaging
criteria to recommend percutaneous sampling or dedicated follow-up.
The above is in keeping with the ACR TI-RADS recommendations - [HOSPITAL] 5216;[DATE].

## 2018-02-24 ENCOUNTER — Other Ambulatory Visit: Payer: Self-pay | Admitting: Internal Medicine

## 2018-02-24 DIAGNOSIS — F411 Generalized anxiety disorder: Secondary | ICD-10-CM

## 2018-02-24 NOTE — Telephone Encounter (Signed)
[place] Controlled Substance Database checked. Last filled on 05/20/18

## 2018-04-10 ENCOUNTER — Ambulatory Visit: Payer: Self-pay | Admitting: Internal Medicine

## 2018-06-10 ENCOUNTER — Encounter: Payer: Self-pay | Admitting: Cardiovascular Disease

## 2018-06-23 ENCOUNTER — Encounter: Payer: Self-pay | Admitting: Internal Medicine

## 2018-06-23 NOTE — Telephone Encounter (Signed)
Documented flu shot.../LMB  

## 2018-07-03 ENCOUNTER — Ambulatory Visit: Payer: Medicare Other | Admitting: Cardiovascular Disease

## 2018-07-03 ENCOUNTER — Encounter: Payer: Self-pay | Admitting: Cardiovascular Disease

## 2018-07-03 VITALS — BP 120/80 | HR 53 | Ht 70.0 in | Wt 150.0 lb

## 2018-07-03 DIAGNOSIS — I340 Nonrheumatic mitral (valve) insufficiency: Secondary | ICD-10-CM | POA: Diagnosis not present

## 2018-07-03 DIAGNOSIS — R002 Palpitations: Secondary | ICD-10-CM | POA: Diagnosis not present

## 2018-07-03 NOTE — Patient Instructions (Signed)
Medication Instructions:  Your physician recommends that you continue on your current medications as directed. Please refer to the Current Medication list given to you today.  If you need a refill on your cardiac medications before your next appointment, please call your pharmacy.   Lab work: none If you have labs (blood work) drawn today and your tests are completely normal, you will receive your results only by: Marland Kitchen MyChart Message (if you have MyChart) OR . A paper copy in the mail If you have any lab test that is abnormal or we need to change your treatment, we will call you to review the results.  Testing/Procedures: Your physician has requested that you have an echocardiogram. Echocardiography is a painless test that uses sound waves to create images of your heart. It provides your doctor with information about the size and shape of your heart and how well your heart's chambers and valves are working. This procedure takes approximately one hour. There are no restrictions for this procedure.  To be done in January 2020  Follow-Up: At [facility], you and your health needs are our priority.  As part of our continuing mission to provide you with exceptional heart care, we have created designated Provider Care Teams.  These Care Teams include your primary Cardiologist (physician) and Advanced Practice Providers (APPs -  Physician Assistants and Nurse Practitioners) who all work together to provide you with the care you need, when you need it. You will need a follow up appointment in 12 months.  Please call our office 2 months in advance to schedule this appointment.  You may see Lauree Chandler, MD or one of the following Advanced Practice Providers on your designated Care Team:   [place], PA-C Melina Copa, PA-C . Ermalinda Barrios, PA-C  Any Other Special Instructions Will Be Listed Below (If Applicable).

## 2018-07-03 NOTE — Progress Notes (Signed)
c  Chief Complaint  Patient presents with  . Follow-up    mitral regurgitation   History of Present Illness: 72 yo male with history of mitral regurgitation, anxiety and GERD who is here today for cardiac follow up. He has been followed by Dr. Johnsie Cancel in the past. I met him for the first time January 2014. Echo January 2014 with mild MR. Stress myoview March 2016 with LVEF=59%, no ischemia. Echo 08/23/15 with normal LV size and function, mild MR. Mild to moderate TR, trivial AI.  He is here today for follow up. The patient denies any chest pain, dyspnea, palpitations, lower extremity edema, orthopnea, PND, dizziness, near syncope or syncope. He rides his bike every day.   Primary Care Physician: Binnie Rail, MD   Past Medical History:  Diagnosis Date  . Anxiety 01/02/2011  . GERD 05/24/2008  . 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 Gynecomastia   . Gynecomastia, male 07/22/2012  . History of colonic polyps 09/16/2014  . MITRAL REGURGITATION 11/15/2008   Mild to moderate by echo 2010  . Prostate cancer [facility])    Dr Alinda Money  . Spinal stenosis 01/02/2011    Past Surgical History:  Procedure Laterality Date  . colonoscopy with polypectomy  1997   neg since; Dr Peters, [place]  . LUMBAR DISC SURGERY  11/2010   L4-5 discectomy & laminectomy, Dr Ellene Route   . LUMBAR DISC SURGERY  01/2011   L4-5 discectomy,Dr Elsner  . LUMBAR DISC SURGERY  09/2011   L4-5 minimally invasive fusion , Dr Myrene Buddy, [facility]  . PROSTATECTOMY  2011  . TOOTH EXTRACTION      Current Outpatient Medications  Medication Sig Dispense Refill  . Cholecalciferol (VITAMIN D3) 25 MCG TABS Take 25 mcg by mouth daily.    . diazepam (VALIUM) 2 MG tablet TAKE 1 TABLET BY MOUTH  DAILY AS NEEDED FOR ANXIETY 90 tablet 0  . mirtazapine (REMERON) 7.5 MG tablet Take 1 tablet (7.5 mg total) by mouth at bedtime. 90 tablet 3  . Multiple Vitamin (MULTIVITAMIN) capsule Take 1 capsule by mouth daily.      . Omega-3 Fatty Acids (FISH OIL)  1000 MG CAPS Take 1,000 mg by mouth daily.      No current facility-administered medications for this visit.     Allergies  Allergen Reactions  . Zoloft [Sertraline Hcl] Other (See Comments)    "ramped anxiety up"  Same effect with Prozac & Lexapro    Social History   Socioeconomic History  . Marital status: Married    Spouse name: Not on file  . Number of children: 2  . Years of education: Not on file  . Highest education level: Not on file  Occupational History  . Occupation: Retired-works part time at [employer]: [place]  . Financial resource strain: Not on file  . Food insecurity:    Worry: Not on file    Inability: Not on file  . Transportation needs:    Medical: Not on file    Non-medical: Not on file  Tobacco Use  . Smoking status: Former Smoker    Packs/day: 1.00    Years: 10.00    Pack years: 10.00    Types: Cigarettes    Last attempt to quit: 08/12/1973    Years since quitting: 44.9  . Smokeless tobacco: Former Systems developer    Types: Chew  . Tobacco comment: smoked 1960-1975, up to 1 ppd  Substance and  Sexual Activity  . Alcohol use: Yes    Alcohol/week: 7.0 standard drinks    Types: 7 Standard drinks or equivalent per week    Comment:  < 1/day  . Drug use: No  . Sexual activity: Not on file  Lifestyle  . Physical activity:    Days per week: Not on file    Minutes per session: Not on file  . Stress: Not on file  Relationships  . Social connections:    Talks on phone: Not on file    Gets together: Not on file    Attends religious service: Not on file    Active member of club or organization: Not on file    Attends meetings of clubs or organizations: Not on file    Relationship status: Not on file  . Intimate partner violence:    Fear of current or ex partner: Not on file    Emotionally abused: Not on file    Physically abused: Not on file    Forced sexual activity: Not on file  Other Topics Concern  . Not on file    Social History Narrative  . Not on file    Family History  Problem Relation Age of Onset  . Anxiety disorder Mother   . Diabetes Mother        not definite  . Bladder Cancer Mother   . Anxiety disorder Son   . Anxiety disorder Brother   . Atrial fibrillation Brother   . Alcohol abuse Father   . CAD Neg Hx   . Stroke Neg Hx   . Heart disease Neg Hx     Review of Systems:  As stated in the HPI and otherwise negative.   BP 120/80   Pulse (!) 53   Ht '5\' 10"'  (1.778 m)   Wt 150 lb (68 kg)   SpO2 98%   BMI 21.52 kg/m   Physical Examination: General: Well developed, well nourished, NAD  HEENT: OP clear, mucus membranes moist  SKIN: warm, dry. No rashes. Neuro: No focal deficits  Musculoskeletal: Muscle strength 5/5 all ext  Psychiatric: Mood and affect normal  Neck: No JVD, no carotid bruits, no thyromegaly, no lymphadenopathy.  Lungs:Clear bilaterally, no wheezes, rhonci, crackles Cardiovascular: Regular rate and rhythm. No murmurs, gallops or rubs. Abdomen:Soft. Bowel sounds present. Non-tender.  Extremities: No lower extremity edema. Pulses are 2 + in the bilateral DP/PT.  Echo 08/23/15: Left ventricle: The cavity size was normal. Wall thickness was normal. Systolic function was normal. The estimated ejection fraction was in the range of 55% to 60%. Wall motion was normal; there were no regional wall motion abnormalities. Features are consistent with a pseudonormal left ventricular filling pattern, with concomitant abnormal relaxation and increased filling pressure (grade 2 diastolic dysfunction). - Aortic valve: There was trivial regurgitation. - Mitral valve: There was mild regurgitation. - Left atrium: The atrium was mildly dilated. - Right atrium: The atrium was moderately dilated. - Tricuspid valve: There was mild-moderate regurgitation. - Pulmonary arteries: Systolic pressure was mildly increased. PA peak pressure: 33 mm Hg (S).  EKG:  EKG is  ordered today. The ekg ordered today demonstrates Sinus bradycardia, rate 53 bpm.  Incomplete RBBB. Poor R wave progression precordial leads. T wave inversion inferior leads--chronic. NO change since last EKG in 2018  Recent Labs: No results found for requested labs within last 8760 hours.   Lipid Panel    Component Value Date/Time   CHOL 213 (H) 06/30/2017 1002   TRIG  54.0 06/30/2017 1002   HDL 96.80 06/30/2017 1002   CHOLHDL 2 06/30/2017 1002   VLDL 10.8 06/30/2017 1002   LDLCALC 105 (H) 06/30/2017 1002   LDLDIRECT 119.9 09/24/2013 1125     Wt Readings from Last 3 Encounters:  07/03/18 150 lb (68 kg)  12/31/17 152 lb (68.9 kg)  06/30/17 159 lb (72.1 kg)     Other studies Reviewed: Additional studies/ records that were reviewed today include: . Review of the above records demonstrates:    Assessment and Plan:   1. Mitral regurgitation: Mild by echo January 2017. Will repeat echo in January 2020.   2. Palpitations/PACs: No recent palpitations. He will continue to avoid stimulants.    Current medicines are reviewed at length with the patient today.  The patient does not have concerns regarding medicines.  The following changes have been made:  no change  Labs/ tests ordered today include:   Orders Placed This Encounter  Procedures  . EKG 12-Lead  . ECHOCARDIOGRAM COMPLETE    Disposition:   FU with me in 12  months  Signed, Lauree Chandler, MD 07/03/2018 9:38 AM    [facility] [place], [place],   [address] Phone: [phone]; Fax: [phone]     Lauree Chandler

## 2018-07-05 NOTE — Patient Instructions (Addendum)
Tests ordered today. Your results will be released to [place] (or called to you) after review, usually within 72hours after test completion. If any changes need to be made, you will be notified at that same time.  All other Health Maintenance issues reviewed.   All recommended immunizations and age-appropriate screenings are up-to-date or discussed.  No immunizations administered today.   Medications reviewed and updated.  Changes include :   none  Your prescription(s) have been submitted to your pharmacy. Please take as directed and contact our office if you believe you are having problem(s) with the medication(s).   Please followup in 6 months     Health Maintenance, Male A healthy lifestyle and preventive care is important for your health and wellness. Ask your health care provider about what schedule of regular examinations is right for you. What should I know about weight and diet? Eat a Healthy Diet  Eat plenty of vegetables, fruits, whole grains, low-fat dairy products, and lean protein.  Do not eat a lot of foods high in solid fats, added sugars, or salt.  Maintain a Healthy Weight Regular exercise can help you achieve or maintain a healthy weight. You should:  Do at least 150 minutes of exercise each week. The exercise should increase your heart rate and make you sweat (moderate-intensity exercise).  Do strength-training exercises at least twice a week.  Watch Your Levels of Cholesterol and Blood Lipids  Have your blood tested for lipids and cholesterol every 5 years starting at 73 years of age. If you are at high risk for heart disease, you should start having your blood tested when you are 73 years old. You may need to have your cholesterol levels checked more often if: ? Your lipid or cholesterol levels are high. ? You are older than 73 years of age. ? You are at high risk for heart disease.  What should I know about cancer screening? Many types of cancers can be  detected early and may often be prevented. Lung Cancer  You should be screened every year for lung cancer if: ? You are a current smoker who has smoked for at least 30 years. ? You are a former smoker who has quit within the past 15 years.  Talk to your health care provider about your screening options, when you should start screening, and how often you should be screened.  Colorectal Cancer  Routine colorectal cancer screening usually begins at 73 years of age and should be repeated every 5-10 years until you are 73 years old. You may need to be screened more often if early forms of precancerous polyps or small growths are found. Your health care provider may recommend screening at an earlier age if you have risk factors for colon cancer.  Your health care provider may recommend using home test kits to check for hidden blood in the stool.  A small camera at the end of a tube can be used to examine your colon (sigmoidoscopy or colonoscopy). This checks for the earliest forms of colorectal cancer.  Prostate and Testicular Cancer  Depending on your age and overall health, your health care provider may do certain tests to screen for prostate and testicular cancer.  Talk to your health care provider about any symptoms or concerns you have about testicular or prostate cancer.  Skin Cancer  Check your skin from head to toe regularly.  Tell your health care provider about any new moles or changes in moles, especially if: ? There is  a change in a mole's size, shape, or color. ? You have a mole that is larger than a pencil eraser.  Always use sunscreen. Apply sunscreen liberally and repeat throughout the day.  Protect yourself by wearing long sleeves, pants, a wide-brimmed hat, and sunglasses when outside.  What should I know about heart disease, diabetes, and high blood pressure?  If you are 28-84 years of age, have your blood pressure checked every 3-5 years. If you are 23 years of age  or older, have your blood pressure checked every year. You should have your blood pressure measured twice-once when you are at a hospital or clinic, and once when you are not at a hospital or clinic. Record the average of the two measurements. To check your blood pressure when you are not at a hospital or clinic, you can use: ? An automated blood pressure machine at a pharmacy. ? A home blood pressure monitor.  Talk to your health care provider about your target blood pressure.  If you are between 59-38 years old, ask your health care provider if you should take aspirin to prevent heart disease.  Have regular diabetes screenings by checking your fasting blood sugar level. ? If you are at a normal weight and have a low risk for diabetes, have this test once every three years after the age of 31. ? If you are overweight and have a high risk for diabetes, consider being tested at a younger age or more often.  A one-time screening for abdominal aortic aneurysm (AAA) by ultrasound is recommended for men aged 78-75 years who are current or former smokers. What should I know about preventing infection? Hepatitis B If you have a higher risk for hepatitis B, you should be screened for this virus. Talk with your health care provider to find out if you are at risk for hepatitis B infection. Hepatitis C Blood testing is recommended for:  Everyone born from [birth year] through 1966.  Anyone with known risk factors for hepatitis C.  Sexually Transmitted Diseases (STDs)  You should be screened each year for STDs including gonorrhea and chlamydia if: ? You are sexually active and are younger than 73 years of age. ? You are older than 73 years of age and your health care provider tells you that you are at risk for this type of infection. ? Your sexual activity has changed since you were last screened and you are at an increased risk for chlamydia or gonorrhea. Ask your health care provider if you are at  risk.  Talk with your health care provider about whether you are at high risk of being infected with HIV. Your health care provider may recommend a prescription medicine to help prevent HIV infection.  What else can I do?  Schedule regular health, dental, and eye exams.  Stay current with your vaccines (immunizations).  Do not use any tobacco products, such as cigarettes, chewing tobacco, and e-cigarettes. If you need help quitting, ask your health care provider.  Limit alcohol intake to no more than 2 drinks per day. One drink equals 12 ounces of beer, 5 ounces of wine, or 1 ounces of hard liquor.  Do not use street drugs.  Do not share needles.  Ask your health care provider for help if you need support or information about quitting drugs.  Tell your health care provider if you often feel depressed.  Tell your health care provider if you have ever been abused or do not feel safe at  information is not intended to replace advice given to you by your health care provider. Make sure you discuss any questions you have with your health care provider. Document Released: 01/25/2008 Document Revised: 03/27/2016 Document Reviewed: 05/02/2015 Elsevier Interactive Patient Education  2018 Elsevier Inc.  

## 2018-07-05 NOTE — Progress Notes (Signed)
Subjective:    Patient ID: Edward Mccall, male    DOB: 1945/09/18, 72 y.o.   MRN: [id number]  HPI He is here for a physical exam.   He is still fighting the anxiety.  He has good days and bad days.  He varies his valium dose as needed.  He wants to get off of it if possible-he is always tried to get off of it, but has not been able to.  Then most he will take it 1 time is 8 mg, but often takes a fraction of a pill.  He is taking the Remeron at night.  He is unsure if it does not fine, but does not want to stop it at this point.  He is exercising regularly-rides his bike extensively and also goes to the gym and lifts weights.  He denies any major changes in his health since he was here last and has no concerns except for the anxiety.  Medications and allergies reviewed with patient and updated if appropriate.  Patient Active Problem List   Diagnosis Date Noted  . Testosterone deficiency in male 06/30/2017  . History of colonic polyps 09/16/2014  . Degeneration of intervertebral disc of lumbosacral region 10/29/2011  . Elevated blood pressure reading 06/25/2011  . Lipoma 04/06/2011  . Anxiety 01/02/2011  . Spinal stenosis 01/02/2011  . PROSTATE CANCER, HX OF 06/20/2010  . MITRAL REGURGITATION 11/15/2008  . Supraventricular premature beats 11/15/2008  . [place] SYNDROME 10/21/2008  . Prediabetes 10/21/2008  . Nonspecific abnormal electrocardiogram (ECG) (EKG) 03/25/2007    Current Outpatient Medications on File Prior to Visit  Medication Sig Dispense Refill  . Cholecalciferol (VITAMIN D3) 25 MCG TABS Take 25 mcg by mouth daily.    . diazepam (VALIUM) 2 MG tablet TAKE 1 TABLET BY MOUTH  DAILY AS NEEDED FOR ANXIETY 90 tablet 0  . mirtazapine (REMERON) 7.5 MG tablet Take 1 tablet (7.5 mg total) by mouth at bedtime. 90 tablet 3  . Multiple Vitamin (MULTIVITAMIN) capsule Take 1 capsule by mouth daily.      . Omega-3 Fatty Acids (FISH OIL) 1000 MG CAPS Take 1,000 mg by mouth  daily.      No current facility-administered medications on file prior to visit.     Past Medical History:  Diagnosis Date  . Anxiety 01/02/2011  . GERD 05/24/2008  . 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 Gynecomastia   . Gynecomastia, male 07/22/2012  . History of colonic polyps 09/16/2014  . MITRAL REGURGITATION 11/15/2008   Mild to moderate by echo 2010  . Prostate cancer [facility])    Dr Alinda Money  . Spinal stenosis 01/02/2011    Past Surgical History:  Procedure Laterality Date  . colonoscopy with polypectomy  1997   neg since; Dr Peters, [place]  . LUMBAR DISC SURGERY  11/2010   L4-5 discectomy & laminectomy, Dr Ellene Route   . LUMBAR DISC SURGERY  01/2011   L4-5 discectomy,Dr Elsner  . LUMBAR DISC SURGERY  09/2011   L4-5 minimally invasive fusion , Dr Myrene Buddy, [facility]  . PROSTATECTOMY  2011  . TOOTH EXTRACTION      Social History   Socioeconomic History  . Marital status: Married    Spouse name: Not on file  . Number of children: 2  . Years of education: Not on file  . Highest education level: Not on file  Occupational History  . Occupation: Retired-works part time at [employer]: [place]  Social Needs  . Financial resource strain: Not on file  . Food insecurity:    Worry: Not on file    Inability: Not on file  . Transportation needs:    Medical: Not on file    Non-medical: Not on file  Tobacco Use  . Smoking status: Former Smoker    Packs/day: 1.00    Years: 10.00    Pack years: 10.00    Types: Cigarettes    Last attempt to quit: 08/12/1973    Years since quitting: 44.9  . Smokeless tobacco: Former Systems developer    Types: Chew  . Tobacco comment: smoked 1960-1975, up to 1 ppd  Substance and Sexual Activity  . Alcohol use: Yes    Alcohol/week: 7.0 standard drinks    Types: 7 Standard drinks or equivalent per week    Comment:  < 1/day  . Drug use: No  . Sexual activity: Not on file  Lifestyle  . Physical activity:    Days per week: Not on file     Minutes per session: Not on file  . Stress: Not on file  Relationships  . Social connections:    Talks on phone: Not on file    Gets together: Not on file    Attends religious service: Not on file    Active member of club or organization: Not on file    Attends meetings of clubs or organizations: Not on file    Relationship status: Not on file  Other Topics Concern  . Not on file  Social History Narrative  . Not on file    Family History  Problem Relation Age of Onset  . Anxiety disorder Mother   . Diabetes Mother        not definite  . Bladder Cancer Mother   . Anxiety disorder Son   . Anxiety disorder Brother   . Atrial fibrillation Brother   . Alcohol abuse Father   . CAD Neg Hx   . Stroke Neg Hx   . Heart disease Neg Hx     Review of Systems  Constitutional: Negative for chills and fever.  Eyes: Positive for visual disturbance (occular migraines).  Respiratory: Negative for cough, shortness of breath and wheezing.   Cardiovascular: Positive for palpitations (occ with anxiety). Negative for chest pain and leg swelling.  Gastrointestinal: Negative for blood in stool, constipation, diarrhea and nausea.       No gerd  Genitourinary: Negative for dysuria and hematuria.  Musculoskeletal: Positive for arthralgias (mild).  Skin: Negative for color change and rash.  Neurological: Positive for headaches (occasional, mild). Negative for dizziness and light-headedness.  Psychiatric/Behavioral: Negative for dysphoric mood. The patient is nervous/anxious.        Objective:   Vitals:   07/06/18 0819  BP: 140/72  Pulse: (!) 48  Resp: 16  Temp: 97.8 F (36.6 C)  SpO2: 99%   Filed Weights   07/06/18 0819  Weight: 154 lb (69.9 kg)   Body mass index is 22.1 kg/m.  BP Readings from Last 3 Encounters:  07/06/18 140/72  07/03/18 120/80  12/31/17 134/72    Wt Readings from Last 3 Encounters:  07/06/18 154 lb (69.9 kg)  07/03/18 150 lb (68 kg)  12/31/17 152 lb  (68.9 kg)     Physical Exam Constitutional: He appears well-developed and well-nourished. No distress.  HENT:  Head: Normocephalic and atraumatic.  Right Ear: External ear normal.  Left Ear: External ear normal.  Mouth/Throat: Oropharynx is clear and moist.  Normal ear canals and TM b/l  Eyes: Conjunctivae and EOM are normal.  Neck: Neck supple. No tracheal deviation present. No thyromegaly present.  No carotid bruit  Cardiovascular: Normal rate, regular rhythm, normal heart sounds and intact distal pulses.  2/6 systolic murmur heard. Pulmonary/Chest: Effort normal and breath sounds normal. No respiratory distress. He has no wheezes. He has no rales.  Abdominal: Soft. He exhibits no distension. There is no tenderness.  Genitourinary: deferred  Musculoskeletal: He exhibits no edema.  Lymphadenopathy:   He has no cervical adenopathy.  Skin: Skin is warm and dry. He is not diaphoretic.  Psychiatric: He has a normal mood and affect. His behavior is normal.         Assessment & Plan:   Physical exam: Screening blood work  ordered Immunizations   Discussed shingrix, others up to date Colonoscopy   Up to date  Eye exams   Up to date  EKG   Done by cardio 06/2018 Exercise  Rides his bike regularly, does weights 3/week Weight   Normal BMI Skin   no concerns Substance abuse   none  See Problem List for Assessment and Plan of chronic medical problems.   Follow-up in 6 months

## 2018-07-06 ENCOUNTER — Ambulatory Visit (INDEPENDENT_AMBULATORY_CARE_PROVIDER_SITE_OTHER): Payer: Medicare Other | Admitting: Internal Medicine

## 2018-07-06 ENCOUNTER — Other Ambulatory Visit (INDEPENDENT_AMBULATORY_CARE_PROVIDER_SITE_OTHER): Payer: Medicare Other

## 2018-07-06 ENCOUNTER — Encounter: Payer: Self-pay | Admitting: Internal Medicine

## 2018-07-06 VITALS — BP 140/72 | HR 48 | Temp 97.8°F | Resp 16 | Ht 70.0 in | Wt 154.0 lb

## 2018-07-06 DIAGNOSIS — E291 Testicular hypofunction: Secondary | ICD-10-CM

## 2018-07-06 DIAGNOSIS — Z0000 Encounter for general adult medical examination without abnormal findings: Secondary | ICD-10-CM

## 2018-07-06 DIAGNOSIS — R7303 Prediabetes: Secondary | ICD-10-CM | POA: Diagnosis not present

## 2018-07-06 DIAGNOSIS — R030 Elevated blood-pressure reading, without diagnosis of hypertension: Secondary | ICD-10-CM

## 2018-07-06 DIAGNOSIS — Z8546 Personal history of malignant neoplasm of prostate: Secondary | ICD-10-CM

## 2018-07-06 DIAGNOSIS — F419 Anxiety disorder, unspecified: Secondary | ICD-10-CM | POA: Diagnosis not present

## 2018-07-06 LAB — CBC WITH DIFFERENTIAL/PLATELET
BASOS ABS: 0 10*3/uL (ref 0.0–0.1)
Basophils Relative: 1.1 % (ref 0.0–3.0)
EOS PCT: 2.5 % (ref 0.0–5.0)
Eosinophils Absolute: 0.1 10*3/uL (ref 0.0–0.7)
HEMATOCRIT: 44 % (ref 39.0–52.0)
Hemoglobin: 15.1 g/dL (ref 13.0–17.0)
LYMPHS ABS: 1.3 10*3/uL (ref 0.7–4.0)
Lymphocytes Relative: 30.5 % (ref 12.0–46.0)
MCHC: 34.3 g/dL (ref 30.0–36.0)
MCV: 92.3 fl (ref 78.0–100.0)
MONOS PCT: 8.7 % (ref 3.0–12.0)
Monocytes Absolute: 0.4 10*3/uL (ref 0.1–1.0)
NEUTROS PCT: 57.2 % (ref 43.0–77.0)
Neutro Abs: 2.4 10*3/uL (ref 1.4–7.7)
Platelets: 215 10*3/uL (ref 150.0–400.0)
RBC: 4.77 Mil/uL (ref 4.22–5.81)
RDW: 13 % (ref 11.5–15.5)
WBC: 4.3 10*3/uL (ref 4.0–10.5)

## 2018-07-06 LAB — LIPID PANEL
Cholesterol: 205 mg/dL — ABNORMAL HIGH (ref 0–200)
HDL: 91.4 mg/dL (ref >39.00)
LDL Cholesterol: 103 mg/dL — ABNORMAL HIGH (ref 0–99)
NonHDL: 113.22
Total CHOL/HDL Ratio: 2
Triglycerides: 51 mg/dL (ref 0.0–149.0)
VLDL: 10.2 mg/dL (ref 0.0–40.0)

## 2018-07-06 LAB — COMPREHENSIVE METABOLIC PANEL
ALK PHOS: 58 U/L (ref 39–117)
ALT: 26 U/L (ref 0–53)
AST: 41 U/L — ABNORMAL HIGH (ref 0–37)
Albumin: 4.5 g/dL (ref 3.5–5.2)
BILIRUBIN TOTAL: 0.9 mg/dL (ref 0.2–1.2)
BUN: 16 mg/dL (ref 6–23)
CO2: 31 mEq/L (ref 19–32)
Calcium: 9.9 mg/dL (ref 8.4–10.5)
Chloride: 101 mEq/L (ref 96–112)
Creatinine, Ser: 0.96 mg/dL (ref 0.40–1.50)
GFR: 81.58 mL/min (ref >60.00)
GLUCOSE: 106 mg/dL — AB (ref 70–99)
Potassium: 4.2 mEq/L (ref 3.5–5.1)
SODIUM: 139 meq/L (ref 135–145)
TOTAL PROTEIN: 6.8 g/dL (ref 6.0–8.3)

## 2018-07-06 LAB — PSA, MEDICARE: PSA: 0.06 ng/mL — AB (ref 0.10–4.00)

## 2018-07-06 LAB — HEMOGLOBIN A1C: Hgb A1c MFr Bld: 5.9 % (ref 4.6–6.5)

## 2018-07-06 LAB — TSH: TSH: 1.17 u[IU]/mL (ref 0.35–4.50)

## 2018-07-06 NOTE — Assessment & Plan Note (Signed)
Check a1c Low sugar / carb diet Stressed regular exercise   

## 2018-07-06 NOTE — Assessment & Plan Note (Signed)
He continues to have intermittent anxiety-good days and bad days We will continue the Remeron at night Continue Valium as needed-he varies how much he takes, but does not overtake it.  He would ideally like to get off of the medication, but I do not think that is realistic Continue regular exercise Follow-up in 6 months

## 2018-07-06 NOTE — Assessment & Plan Note (Signed)
Blood pressure minimally elevated Check CMP, TSH Minimal elevation is likely anxiety related

## 2018-07-06 NOTE — Assessment & Plan Note (Signed)
Has had low testosterone in the past-has never been on replacement Is interested to see what his testosterone level is now Check level

## 2018-07-06 NOTE — Assessment & Plan Note (Signed)
No longer following with neurology Check PSA

## 2018-07-10 LAB — TESTOSTERONE, FREE & TOTAL
FREE TESTOSTERONE: 40.9 pg/mL (ref 30.0–135.0)
TESTOSTERONE, TOTAL, LC-MS-MS: 430 ng/dL (ref 250–1100)

## 2018-07-12 ENCOUNTER — Encounter: Payer: Self-pay | Admitting: Internal Medicine

## 2018-08-07 ENCOUNTER — Other Ambulatory Visit: Payer: Self-pay | Admitting: Internal Medicine

## 2018-08-07 DIAGNOSIS — F411 Generalized anxiety disorder: Secondary | ICD-10-CM

## 2018-08-07 NOTE — Telephone Encounter (Signed)
[place] Controlled Database Checked Last filled: 02/24/18 # 90 LOV w/PCP: 07/06/18 Next appt w/PCP: 01/06/19

## 2018-08-08 ENCOUNTER — Encounter: Payer: Self-pay | Admitting: Internal Medicine

## 2018-08-26 ENCOUNTER — Ambulatory Visit (HOSPITAL_COMMUNITY): Payer: Medicare Other | Attending: Internal Medicine

## 2018-08-26 ENCOUNTER — Other Ambulatory Visit: Payer: Self-pay

## 2018-08-26 DIAGNOSIS — I340 Nonrheumatic mitral (valve) insufficiency: Secondary | ICD-10-CM | POA: Diagnosis not present

## 2018-10-08 ENCOUNTER — Encounter: Payer: Self-pay | Admitting: Internal Medicine

## 2018-10-08 DIAGNOSIS — F411 Generalized anxiety disorder: Secondary | ICD-10-CM

## 2018-10-10 MED ORDER — DIAZEPAM 2 MG PO TABS: 2.0000 mg | ORAL_TABLET | Freq: Every day | ORAL | 0 refills | Status: DC | PRN

## 2018-12-10 ENCOUNTER — Other Ambulatory Visit: Payer: Self-pay | Admitting: Internal Medicine

## 2019-01-05 ENCOUNTER — Encounter: Payer: Self-pay | Admitting: Internal Medicine

## 2019-01-05 NOTE — Progress Notes (Signed)
Virtual Visit via Video Note  I connected with Edward Mccall on 01/06/19 at  7:45 AM EDT by a video enabled telemedicine application and verified that I am speaking with the correct person using two identifiers.   I discussed the limitations of evaluation and management by telemedicine and the availability of in person appointments. The patient expressed understanding and agreed to proceed.  The patient is currently at home and I am in the office.    No referring provider.    History of Present Illness: He is here for follow up of his chronic medical conditions.    He is exercising regularly - bikes a lot and walks.    Prediabetes:  He is compliant with a low sugar/carbohydrate diet.  He is exercising regularly.  Anxiety: He is taking the remeron at night and valium as needed.  He has good days and bad days.  He denies any side effects from the medication. He feels his anxiety is well controlled some days and not controlled others.  He gets bouts of increased of adrenaline at times and the valium does help.  The best thing for his anxiety is his exercise.  Elevated blood pressure: He has been monitoring his blood pressure at home and his numbers are variable.  He sometimes will have a high number first thing in the morning, but later on in the day and especially after exercise it will be low.   Review of Systems  Constitutional: Negative for chills and fever.  Respiratory: Negative for cough, shortness of breath and wheezing.   Cardiovascular: Negative for chest pain, palpitations and leg swelling.  Neurological: Positive for headaches (occ).     Social History   Socioeconomic History  . Marital status: Married    Spouse name: Not on file  . Number of children: 2  . Years of education: Not on file  . Highest education level: Not on file  Occupational History  . Occupation: Retired-works part time at [employer]: [place]  . Financial  resource strain: Not on file  . Food insecurity:    Worry: Not on file    Inability: Not on file  . Transportation needs:    Medical: Not on file    Non-medical: Not on file  Tobacco Use  . Smoking status: Former Smoker    Packs/day: 1.00    Years: 10.00    Pack years: 10.00    Types: Cigarettes    Last attempt to quit: 08/12/1973    Years since quitting: 45.4  . Smokeless tobacco: Former Systems developer    Types: Chew  . Tobacco comment: smoked 1960-1975, up to 1 ppd  Substance and Sexual Activity  . Alcohol use: Yes    Alcohol/week: 7.0 standard drinks    Types: 7 Standard drinks or equivalent per week    Comment:  < 1/day  . Drug use: No  . Sexual activity: Not on file  Lifestyle  . Physical activity:    Days per week: Not on file    Minutes per session: Not on file  . Stress: Not on file  Relationships  . Social connections:    Talks on phone: Not on file    Gets together: Not on file    Attends religious service: Not on file    Active member of club or organization: Not on file    Attends meetings of clubs or organizations: Not on file  Relationship status: Not on file  Other Topics Concern  . Not on file  Social History Narrative  . Not on file     Observations/Objective: Appears well in NAD Normal mood and affect  BP 130/65, 106/59, 161/68  Sugars - 111  Assessment and Plan:  See Problem List for Assessment and Plan of chronic medical problems.   Follow Up Instructions:  A referral was ordered for GI - he is due for a colonoscopy.     I discussed the assessment and treatment plan with the patient. The patient was provided an opportunity to ask questions and all were answered. The patient agreed with the plan and demonstrated an understanding of the instructions.   The patient was advised to call back or seek an in-person evaluation if the symptoms worsen or if the condition fails to improve as anticipated.  physical in 6 months with blood work  then  Binnie Rail, MD

## 2019-01-06 ENCOUNTER — Ambulatory Visit (INDEPENDENT_AMBULATORY_CARE_PROVIDER_SITE_OTHER): Payer: Medicare Other | Admitting: Internal Medicine

## 2019-01-06 DIAGNOSIS — R7303 Prediabetes: Secondary | ICD-10-CM | POA: Diagnosis not present

## 2019-01-06 DIAGNOSIS — F419 Anxiety disorder, unspecified: Secondary | ICD-10-CM

## 2019-01-06 DIAGNOSIS — Z1211 Encounter for screening for malignant neoplasm of colon: Secondary | ICD-10-CM | POA: Diagnosis not present

## 2019-01-06 DIAGNOSIS — R030 Elevated blood-pressure reading, without diagnosis of hypertension: Secondary | ICD-10-CM

## 2019-01-06 NOTE — Assessment & Plan Note (Signed)
He has decreased his sugar Sugars at home typically in the prediabetic range-his last sugar he checked was 111 Exercising regularly Overall healthy diet Follow-up in 6 months for his physical-we will check A1c at that time

## 2019-01-06 NOTE — Assessment & Plan Note (Signed)
Blood pressure tends to be elevated when he is here in the office, but overall well controlled at home Occasionally he will get a high number-typically first thing in the morning.  Blood pressure tends to be very low, especially after exercising He will continue to monitor at home

## 2019-01-06 NOTE — Assessment & Plan Note (Signed)
Overall anxiety is controlled Takes Remeron nightly Takes Valium only as needed and typically takes a very small dose He would prefer not to be on medication, but medication does help and it in the past when he is tried, he is needed to go back on Continue current medications He is exercising intensely and on a regular basis, which is the best thing for his anxiety

## 2019-01-11 ENCOUNTER — Encounter: Payer: Self-pay | Admitting: Internal Medicine

## 2019-01-21 ENCOUNTER — Other Ambulatory Visit: Payer: Self-pay

## 2019-01-21 ENCOUNTER — Ambulatory Visit: Payer: Medicare Other | Admitting: *Deleted

## 2019-01-21 VITALS — Ht 69.0 in | Wt 150.0 lb

## 2019-01-21 DIAGNOSIS — Z1211 Encounter for screening for malignant neoplasm of colon: Secondary | ICD-10-CM

## 2019-01-21 MED ORDER — SUPREP BOWEL PREP KIT 17.5-3.13-1.6 GM/177ML PO SOLN: 1.0000 | Freq: Once | ORAL | 0 refills | Status: AC

## 2019-01-21 NOTE — Progress Notes (Signed)
No egg or soy allergy known to patient  No issues with past sedation with any surgeries  or procedures, no intubation problems  No diet pills per patient No home 02 use per patient  No blood thinners per patient  Pt denies issues with constipation  No A fib or A flutter  EMMI video sent to pt's e mail    Pt verified name, DOB, address and insurance during PV today. Pt mailed instruction packet to included paper to complete and mail back to [facility] with addressed and stamped envelope, Emmi video, copy of consent form to read and not return, and instructions. PV completed over the phone. Pt encouraged to call with questions or issues   Pt is aware that care partner will wait in the car during parking lot; if they feel like they will be too hot to wait in the car; they may wait in the lobby.  We want them to wear a mask (we do not have any that we can provide them), practice social distancing, and we will check their temperatures when they get here.  I did remind patient that their care partner needs to stay in the parking lot the entire time. Pt will wear mask into building

## 2019-01-27 ENCOUNTER — Telehealth: Payer: Self-pay | Admitting: Internal Medicine

## 2019-01-27 NOTE — Telephone Encounter (Signed)

## 2019-01-28 ENCOUNTER — Ambulatory Visit (AMBULATORY_SURGERY_CENTER): Payer: Medicare Other | Admitting: Internal Medicine

## 2019-01-28 ENCOUNTER — Other Ambulatory Visit: Payer: Self-pay

## 2019-01-28 ENCOUNTER — Encounter: Payer: Self-pay | Admitting: Internal Medicine

## 2019-01-28 ENCOUNTER — Encounter: Payer: Medicare Other | Admitting: Internal Medicine

## 2019-01-28 VITALS — BP 110/63 | HR 45 | Temp 97.8°F | Resp 14 | Ht 70.0 in | Wt 154.0 lb

## 2019-01-28 DIAGNOSIS — D123 Benign neoplasm of transverse colon: Secondary | ICD-10-CM | POA: Diagnosis not present

## 2019-01-28 DIAGNOSIS — Z1211 Encounter for screening for malignant neoplasm of colon: Secondary | ICD-10-CM | POA: Diagnosis not present

## 2019-01-28 DIAGNOSIS — K635 Polyp of colon: Secondary | ICD-10-CM

## 2019-01-28 MED ORDER — SODIUM CHLORIDE 0.9 % IV SOLN: 500.0000 mL | Freq: Once | INTRAVENOUS | Status: DC

## 2019-01-28 NOTE — Progress Notes (Signed)
PT taken to PACU. Monitors in place. VSS. Report given to RN. 

## 2019-01-28 NOTE — Op Note (Signed)
[facility] Patient Name: Edward Mccall Procedure Date: 01/28/2019 8:21 AM MRN: [id number] Endoscopist: Jerene Bears , MD Age: 73 Referring MD:  Date of Birth: 1945-11-20 Gender: Male Account #: [id number] Procedure:                Colonoscopy Indications:              Screening for colorectal malignant neoplasm, Last                            colonoscopy 10 years ago Medicines:                Monitored Anesthesia Care Procedure:                Pre-Anesthesia Assessment:                           - Prior to the procedure, a History and Physical                            was performed, and patient medications and                            allergies were reviewed. The patient's tolerance of                            previous anesthesia was also reviewed. The risks                            and benefits of the procedure and the sedation                            options and risks were discussed with the patient.                            All questions were answered, and informed consent                            was obtained. Prior Anticoagulants: The patient has                            taken no previous anticoagulant or antiplatelet                            agents. ASA Grade Assessment: II - A patient with                            mild systemic disease. After reviewing the risks                            and benefits, the patient was deemed in                            satisfactory condition to undergo the procedure.  After obtaining informed consent, the colonoscope                            was passed under direct vision. Throughout the                            procedure, the patient's blood pressure, pulse, and                            oxygen saturations were monitored continuously. The                            Colonoscope was introduced through the anus and                            advanced to the cecum, identified by  appendiceal                            orifice and ileocecal valve. The colonoscopy was                            performed without difficulty. The patient tolerated                            the procedure well. The quality of the bowel                            preparation was good. The ileocecal valve,                            appendiceal orifice, and rectum were photographed. Scope In: 8:31:30 AM Scope Out: 8:51:17 AM Scope Withdrawal Time: 0 hours 13 minutes 25 seconds  Total Procedure Duration: 0 hours 19 minutes 47 seconds  Findings:                 The digital rectal exam was normal.                           Two sessile polyps were found in the splenic                            flexure. The polyps were 2 to 3 mm in size. These                            polyps were removed with a cold snare. Resection                            and retrieval were complete.                           Multiple small and large-mouthed diverticula were                            found in the sigmoid colon.  Internal hemorrhoids were found during                            retroflexion. The hemorrhoids were small. Complications:            No immediate complications. Estimated Blood Loss:     Estimated blood loss was minimal. Impression:               - Two 2 to 3 mm polyps at the splenic flexure,                            removed with a cold snare. Resected and retrieved.                           - Diverticulosis in the sigmoid colon.                           - Small internal hemorrhoids. Recommendation:           - Patient has a contact number available for                            emergencies. The signs and symptoms of potential                            delayed complications were discussed with the                            patient. Return to normal activities tomorrow.                            Written discharge instructions were provided to the                             patient.                           - Resume previous diet.                           - Continue present medications.                           - Await pathology results.                           - Repeat colonoscopy is recommended for                            surveillance. The colonoscopy date will be                            determined after pathology results from today's                            exam become available for review. Jerene Bears, MD 01/28/2019 8:56:21 AM This report has been  signed electronically.

## 2019-01-28 NOTE — Patient Instructions (Signed)
Impression/Recommendations:  Polyp handout given to patient. Diverticulosis handout given to patient. Hemorrhoid handout given to patient.  Resume previous diet. Continue present medications.  Await pathology results.  Repeat colonoscopy recommended for surveillance.  Date to be determined after pathology results reviewed.  YOU HAD AN ENDOSCOPIC PROCEDURE TODAY AT [facility]:   Refer to the procedure report that was given to you for any specific questions about what was found during the examination.  If the procedure report does not answer your questions, please call your gastroenterologist to clarify.  If you requested that your care partner not be given the details of your procedure findings, then the procedure report has been included in a sealed envelope for you to review at your convenience later.  YOU SHOULD EXPECT: Some feelings of bloating in the abdomen. Passage of more gas than usual.  Walking can help get rid of the air that was put into your GI tract during the procedure and reduce the bloating. If you had a lower endoscopy (such as a colonoscopy or flexible sigmoidoscopy) you may notice spotting of blood in your stool or on the toilet paper. If you underwent a bowel prep for your procedure, you may not have a normal bowel movement for a few days.  Please Note:  You might notice some irritation and congestion in your nose or some drainage.  This is from the oxygen used during your procedure.  There is no need for concern and it should clear up in a day or so.  SYMPTOMS TO REPORT IMMEDIATELY:   Following lower endoscopy (colonoscopy or flexible sigmoidoscopy):  Excessive amounts of blood in the stool  Significant tenderness or worsening of abdominal pains  Swelling of the abdomen that is new, acute  Fever of 100F or higher  For urgent or emergent issues, a gastroenterologist can be reached at any hour by calling [phone].   DIET:  We do recommend a  small meal at first, but then you may proceed to your regular diet.  Drink plenty of fluids but you should avoid alcoholic beverages for 24 hours.  ACTIVITY:  You should plan to take it easy for the rest of today and you should NOT DRIVE or use heavy machinery until tomorrow (because of the sedation medicines used during the test).    FOLLOW UP: Our staff will call the number listed on your records 48-72 hours following your procedure to check on you and address any questions or concerns that you may have regarding the information given to you following your procedure. If we do not reach you, we will leave a message.  We will attempt to reach you two times.  During this call, we will ask if you have developed any symptoms of COVID 19. If you develop any symptoms (ie: fever, flu-like symptoms, shortness of breath, cough etc.) before then, please call [phone].  If you test positive for Covid 19 in the 2 weeks post procedure, please call and report this information to [place].    If any biopsies were taken you will be contacted by phone or by letter within the next 1-3 weeks.  Please call us at [phone] if you have not heard about the biopsies in 3 weeks.    SIGNATURES/CONFIDENTIALITY: You and/or your care partner have signed paperwork which will be entered into your electronic medical record.  These signatures attest to the fact that that the information above on your After Visit Summary has been reviewed and is understood.  Full responsibility of the confidentiality of this discharge information lies with you and/or your care-partner.

## 2019-01-28 NOTE — Progress Notes (Signed)
Called to room to assist during endoscopic procedure.  Patient ID and intended procedure confirmed with present staff. Received instructions for my participation in the procedure from the performing physician.  

## 2019-01-28 NOTE — Progress Notes (Signed)
Pt's states no medical or surgical changes since previsit or office visit. (via telephone)

## 2019-02-01 ENCOUNTER — Telehealth: Payer: Self-pay

## 2019-02-01 ENCOUNTER — Encounter: Payer: Self-pay | Admitting: Internal Medicine

## 2019-02-01 NOTE — Telephone Encounter (Signed)
   Follow up Call-  Call back number 01/28/2019  Post procedure Call Back phone  # [phone]  Permission to leave phone message Yes  Some recent data might be hidden     Patient questions:  Do you have a fever, pain , or abdominal swelling? No. Pain Score  0 *  Have you tolerated food without any problems? Yes.    Have you been able to return to your normal activities? Yes.    Do you have any questions about your discharge instructions: Diet   No. Medications  No. Follow up visit  No.  Do you have questions or concerns about your Care? No.  Actions: * If pain score is 4 or above: No action needed, pain <4.  1. Have you developed a fever since your procedure? no  2.   Have you had an respiratory symptoms (SOB or cough) since your procedure? no  3.   Have you tested positive for COVID 19 since your procedure no  4.   Have you had any family members/close contacts diagnosed with the COVID 19 since your procedure?  no   If yes to any of these questions please route to Joylene John, RN and Alphonsa Gin, Therapist, sports.

## 2019-03-31 ENCOUNTER — Other Ambulatory Visit: Payer: Self-pay | Admitting: Internal Medicine

## 2019-05-05 DIAGNOSIS — L570 Actinic keratosis: Secondary | ICD-10-CM | POA: Diagnosis not present

## 2019-05-05 DIAGNOSIS — L720 Epidermal cyst: Secondary | ICD-10-CM | POA: Diagnosis not present

## 2019-05-05 DIAGNOSIS — D1801 Hemangioma of skin and subcutaneous tissue: Secondary | ICD-10-CM | POA: Diagnosis not present

## 2019-05-05 DIAGNOSIS — L819 Disorder of pigmentation, unspecified: Secondary | ICD-10-CM | POA: Diagnosis not present

## 2019-05-05 DIAGNOSIS — L814 Other melanin hyperpigmentation: Secondary | ICD-10-CM | POA: Diagnosis not present

## 2019-07-12 NOTE — Patient Instructions (Addendum)
  Tests ordered today. Your results will be released to MyChart (or called to you) after review.  If any changes need to be made, you will be notified at that same time.    Medications reviewed and updated.  Changes include :   none     Please followup in 6 months   

## 2019-07-12 NOTE — Progress Notes (Addendum)
Subjective:    Patient ID: Edward Mccall, male    DOB: [birth year], 73 y.o.   MRN: [id number]  HPI The patient is here for follow up.  He is exercising regularly - gym 3 days per week, rides bike.    Prediabetes:  He is compliant with a low sugar/carbohydrate diet.  He is exercising regularly.  Anxiety: He is taking his Remeron nightly and Valium as needed as prescribed. He denies any side effects from the medication. He feels his anxiety is well controlled and he is happy with his current dose of medication.   Elevated blood pressure: He monitors his blood pressure at home and it tends to be fairly controlled - 116-145/68 - 74.   Medications and allergies reviewed with patient and updated if appropriate.  Patient Active Problem List   Diagnosis Date Noted  . Testosterone deficiency in male 06/30/2017  . History of colonic polyps 09/16/2014  . Degeneration of intervertebral disc of lumbosacral region 10/29/2011  . Elevated blood pressure reading 06/25/2011  . Lipoma 04/06/2011  . Anxiety 01/02/2011  . Spinal stenosis 01/02/2011  . PROSTATE CANCER, HX OF 06/20/2010  . MITRAL REGURGITATION, mild- mod 11/15/2008  . Supraventricular premature beats 11/15/2008  . [place] SYNDROME 10/21/2008  . Prediabetes 10/21/2008    Current Outpatient Medications on File Prior to Visit  Medication Sig Dispense Refill  . Ascorbic Acid (VITAMIN C) 1000 MG tablet Take 1,000 mg by mouth daily.    . ASHWAGANDHA PO Take by mouth.    . Cholecalciferol (VITAMIN D3) 25 MCG TABS Take 25 mcg by mouth daily. 50 mg daily    . diazepam (VALIUM) 2 MG tablet Take 1 tablet (2 mg total) by mouth daily as needed. for anxiety 90 tablet 0  . mirtazapine (REMERON) 7.5 MG tablet TAKE 1 TABLET BY MOUTH AT  BEDTIME 90 tablet 3  . Multiple Vitamin (MULTIVITAMIN) capsule Take 1 capsule by mouth daily.      . Omega-3 Fatty Acids (FISH OIL) 1000 MG CAPS Take 1,200 mg by mouth daily.     . Zinc 25 MG TABS Take by  mouth. 3 x a week Takes Friday Saturday and "Sunday     No current facility-administered medications on file prior to visit.     Past Medical History:  Diagnosis Date  . Anxiety 01/02/2011  . GERD 05/24/2008  . 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 Gynecomastia   . Gynecomastia, male 07/22/2012  . Heart murmur   . History of colonic polyps 09/16/2014  . MITRAL REGURGITATION 11/15/2008   Mild to moderate by echo 2010  . Prostate cancer (HCC)    Dr Borden  . Spinal stenosis 01/02/2011    Past Surgical History:  Procedure Laterality Date  . COLONOSCOPY     2010 per pt- normal per Mr. Carras   . colonoscopy with polypectomy  1997   neg since; Dr Peters, [facility]  . LUMBAR DISC SURGERY  11/2010   L4-5 discectomy & laminectomy, Dr Elsner   . LUMBAR DISC SURGERY  01/2011   L4-5 discectomy,Dr Elsner  . LUMBAR DISC SURGERY  09/2011   L4-5 minimally invasive fusion , Dr Issacs, [facility]  . POLYPECTOMY     19" 97 polyps   . PROSTATECTOMY  2011  . TOOTH EXTRACTION      Social History   Socioeconomic History  . Marital status: Married    Spouse name: Not on file  . Number of children: 2  . Years of  education: Not on file  . Highest education level: Not on file  Occupational History  . Occupation: Retired-works part time at [employer]: [place]  . Financial resource strain: Not on file  . Food insecurity    Worry: Not on file    Inability: Not on file  . Transportation needs    Medical: Not on file    Non-medical: Not on file  Tobacco Use  . Smoking status: Former Smoker    Packs/day: 1.00    Years: 10.00    Pack years: 10.00    Types: Cigarettes    Quit date: 08/12/1973    Years since quitting: 45.9  . Smokeless tobacco: Former Systems developer    Types: Chew  . Tobacco comment: smoked 1960-1975, up to 1 ppd  Substance and Sexual Activity  . Alcohol use: Yes    Alcohol/week: 7.0 standard drinks    Types: 7 Standard drinks or equivalent per week     Comment:  < 1/day  . Drug use: No  . Sexual activity: Not on file  Lifestyle  . Physical activity    Days per week: Not on file    Minutes per session: Not on file  . Stress: Not on file  Relationships  . Social Herbalist on phone: Not on file    Gets together: Not on file    Attends religious service: Not on file    Active member of club or organization: Not on file    Attends meetings of clubs or organizations: Not on file    Relationship status: Not on file  Other Topics Concern  . Not on file  Social History Narrative  . Not on file    Family History  Problem Relation Age of Onset  . Anxiety disorder Mother   . Diabetes Mother        not definite  . Bladder Cancer Mother   . Anxiety disorder Son   . Anxiety disorder Brother   . Atrial fibrillation Brother   . Alcohol abuse Father   . CAD Neg Hx   . Stroke Neg Hx   . Heart disease Neg Hx   . Colon cancer Neg Hx   . Colon polyps Neg Hx   . Esophageal cancer Neg Hx   . Rectal cancer Neg Hx   . Stomach cancer Neg Hx     Review of Systems  Constitutional: Negative for chills and fever.  Respiratory: Negative for cough, shortness of breath and wheezing.   Cardiovascular: Positive for palpitations (intermittent - no change, chronic). Negative for chest pain and leg swelling.  Neurological: Positive for headaches (occ, mild). Negative for light-headedness.       Objective:   Vitals:   07/13/19 0745  BP: (!) 144/78  Pulse: (!) 49  Resp: 16  Temp: 97.8 F (36.6 C)  SpO2: 99%   BP Readings from Last 3 Encounters:  07/13/19 (!) 144/78  01/28/19 110/63  07/06/18 140/72   Wt Readings from Last 3 Encounters:  07/13/19 161 lb 12.8 oz (73.4 kg)  01/28/19 154 lb (69.9 kg)  01/21/19 150 lb (68 kg)   Body mass index is 23.22 kg/m.   Physical Exam    Constitutional: Appears well-developed and well-nourished. No distress.  HENT:  Head: Normocephalic and atraumatic.  Neck: Neck supple. No  tracheal deviation present. No thyromegaly present.  No cervical lymphadenopathy Cardiovascular: Normal rate, regular rhythm and normal  heart sounds.  2/6 systolic  murmur heard. No carotid bruit .  No edema Pulmonary/Chest: Effort normal and breath sounds normal. No respiratory distress. No has no wheezes. No rales.  Skin: Skin is warm and dry. Not diaphoretic.  Psychiatric: Normal mood and affect. Behavior is normal.      Assessment & Plan:    See Problem List for Assessment and Plan of chronic medical problems.    This visit occurred during the SARS-CoV-2 public health emergency.  Safety protocols were in place, including screening questions prior to the visit, additional usage of staff PPE, and extensive cleaning of exam room while observing appropriate contact time as indicated for disinfecting solutions.

## 2019-07-13 ENCOUNTER — Other Ambulatory Visit (INDEPENDENT_AMBULATORY_CARE_PROVIDER_SITE_OTHER): Payer: Medicare Other

## 2019-07-13 ENCOUNTER — Other Ambulatory Visit: Payer: Self-pay

## 2019-07-13 ENCOUNTER — Encounter: Payer: Self-pay | Admitting: Internal Medicine

## 2019-07-13 ENCOUNTER — Ambulatory Visit (INDEPENDENT_AMBULATORY_CARE_PROVIDER_SITE_OTHER): Payer: Medicare Other | Admitting: Internal Medicine

## 2019-07-13 VITALS — BP 144/78 | HR 49 | Temp 97.8°F | Resp 16 | Ht 70.0 in | Wt 161.8 lb

## 2019-07-13 DIAGNOSIS — F419 Anxiety disorder, unspecified: Secondary | ICD-10-CM | POA: Diagnosis not present

## 2019-07-13 DIAGNOSIS — R7303 Prediabetes: Secondary | ICD-10-CM

## 2019-07-13 DIAGNOSIS — R030 Elevated blood-pressure reading, without diagnosis of hypertension: Secondary | ICD-10-CM | POA: Diagnosis not present

## 2019-07-13 LAB — COMPREHENSIVE METABOLIC PANEL
ALT: 19 U/L (ref 0–53)
AST: 23 U/L (ref 0–37)
Albumin: 4.5 g/dL (ref 3.5–5.2)
Alkaline Phosphatase: 61 U/L (ref 39–117)
BUN: 19 mg/dL (ref 6–23)
CO2: 30 mEq/L (ref 19–32)
Calcium: 9.7 mg/dL (ref 8.4–10.5)
Chloride: 101 mEq/L (ref 96–112)
Creatinine, Ser: 0.79 mg/dL (ref 0.40–1.50)
GFR: 95.85 mL/min (ref >60.00)
Glucose, Bld: 116 mg/dL — ABNORMAL HIGH (ref 70–99)
Potassium: 4.3 mEq/L (ref 3.5–5.1)
Sodium: 138 mEq/L (ref 135–145)
Total Bilirubin: 1 mg/dL (ref 0.2–1.2)
Total Protein: 7 g/dL (ref 6.0–8.3)

## 2019-07-13 LAB — HEMOGLOBIN A1C: Hgb A1c MFr Bld: 5.8 % (ref 4.6–6.5)

## 2019-07-13 NOTE — Assessment & Plan Note (Signed)
Overall controlled Taking remeron nightly Valium as needed

## 2019-07-13 NOTE — Assessment & Plan Note (Signed)
Check a1c Low sugar / carb diet Stressed regular exercise   

## 2019-07-13 NOTE — Assessment & Plan Note (Signed)
BP on average controlled at home Continue to monitor at home Exercises regularly cmp

## 2019-07-14 ENCOUNTER — Encounter: Payer: Self-pay | Admitting: Internal Medicine

## 2019-07-19 ENCOUNTER — Other Ambulatory Visit: Payer: Self-pay

## 2019-07-19 DIAGNOSIS — Z20822 Contact with and (suspected) exposure to covid-19: Secondary | ICD-10-CM

## 2019-07-21 ENCOUNTER — Encounter: Payer: Self-pay | Admitting: Internal Medicine

## 2019-07-21 LAB — NOVEL CORONAVIRUS, NAA: SARS-CoV-2, NAA: DETECTED — AB

## 2019-07-23 ENCOUNTER — Telehealth: Payer: Self-pay | Admitting: Nurse Practitioner

## 2019-07-23 NOTE — Telephone Encounter (Signed)
Called to Discuss with patient about Covid symptoms and the use of bamlanivimab, a monoclonal antibody infusion for those with mild to moderate Covid symptoms and at a high risk of hospitalization.     Pt is qualified for this infusion at the [facility] infusion center due to co-morbid conditions and/or a member of an at-risk group.    Patient Active Problem List   Diagnosis Date Noted  . Testosterone deficiency in male 06/30/2017  . History of colonic polyps 09/16/2014  . Degeneration of intervertebral disc of lumbosacral region 10/29/2011  . Elevated blood pressure reading 06/25/2011  . Lipoma 04/06/2011  . Anxiety 01/02/2011  . Spinal stenosis 01/02/2011  . PROSTATE CANCER, HX OF 06/20/2010  . MITRAL REGURGITATION, mild- mod 11/15/2008  . Supraventricular premature beats 11/15/2008  . [place] SYNDROME 10/21/2008  . Prediabetes 10/21/2008      Patient states that he is much improved and declines infusion at this time.

## 2019-07-25 ENCOUNTER — Encounter: Payer: Self-pay | Admitting: Internal Medicine

## 2019-07-26 ENCOUNTER — Other Ambulatory Visit: Payer: Self-pay | Admitting: Nurse Practitioner

## 2019-07-26 NOTE — Progress Notes (Signed)
  I connected by phone with Edward Mccall on 07/26/2019 at 3:49 PM to discuss the potential use of an new treatment for mild to moderate COVID-19 viral infection in non-hospitalized patients.  This patient is a 73 y.o. male that meets the FDA criteria for Emergency Use Authorization of bamlanivimab or casirivimab\imdevimab.  Has a (+) direct SARS-CoV-2 viral test result  Has mild or moderate COVID-19   Is ? 73 years of age and weighs ? 40 kg  Is NOT hospitalized due to COVID-19  Is NOT requiring oxygen therapy or requiring an increase in baseline oxygen flow rate due to COVID-19  Is within 10 days of symptom onset  Has at least one of the high risk factor(s) for progression to severe COVID-19 and/or hospitalization as defined in EUA.  Specific high risk criteria : >/= 74 yo  Patient Active Problem List   Diagnosis Date Noted  . Testosterone deficiency in male 06/30/2017  . History of colonic polyps 09/16/2014  . Degeneration of intervertebral disc of lumbosacral region 10/29/2011  . Elevated blood pressure reading 06/25/2011  . Lipoma 04/06/2011  . Anxiety 01/02/2011  . Spinal stenosis 01/02/2011  . PROSTATE CANCER, HX OF 06/20/2010  . MITRAL REGURGITATION, mild- mod 11/15/2008  . Supraventricular premature beats 11/15/2008  . [place] SYNDROME 10/21/2008  . Prediabetes 10/21/2008    I have spoken and communicated the following to the patient or parent/caregiver:  1. FDA has authorized the emergency use of bamlanivimab and casirivimab\imdevimab for the treatment of mild to moderate COVID-19 in adults and pediatric patients with positive results of direct SARS-CoV-2 viral testing who are 58 years of age and older weighing at least 40 kg, and who are at high risk for progressing to severe COVID-19 and/or hospitalization.  2. The significant known and potential risks and benefits of bamlanivimab and casirivimab\imdevimab, and the extent to which such potential risks and  benefits are unknown.  3. Information on available alternative treatments and the risks and benefits of those alternatives, including clinical trials.  4. Patients treated with bamlanivimab and casirivimab\imdevimab should continue to self-isolate and use infection control measures (e.g., wear mask, isolate, social distance, avoid sharing personal items, clean and disinfect "high touch" surfaces, and frequent handwashing) according to CDC guidelines.   5. The patient or parent/caregiver has the option to accept or refuse bamlanivimab or casirivimab\imdevimab .  After reviewing this information with the patient, The patient agreed to proceed with receiving the bamlanimivab infusion and will be provided a copy of the Fact sheet prior to receiving the infusion.Fenton Foy 07/26/2019 3:49 PM

## 2019-07-28 ENCOUNTER — Ambulatory Visit (HOSPITAL_COMMUNITY)
Admission: RE | Admit: 2019-07-28 | Discharge: 2019-07-28 | Disposition: A | Discharge status: Home / Self Care | Payer: Medicare Other | Source: Ambulatory Visit | Attending: Pulmonary Disease | Admitting: Pulmonary Disease

## 2019-07-28 ENCOUNTER — Encounter (HOSPITAL_COMMUNITY): Payer: Self-pay

## 2019-07-28 DIAGNOSIS — Z23 Encounter for immunization: Secondary | ICD-10-CM | POA: Diagnosis present

## 2019-07-28 DIAGNOSIS — U071 COVID-19: Secondary | ICD-10-CM | POA: Diagnosis not present

## 2019-07-28 MED ORDER — FAMOTIDINE IN NACL 20-0.9 MG/50ML-% IV SOLN: 20.0000 mg | Freq: Once | INTRAVENOUS | Status: DC | PRN

## 2019-07-28 MED ORDER — SODIUM CHLORIDE 0.9 % IV SOLN
700.0000 mg | Freq: Once | INTRAVENOUS | Status: AC
  Administered 2019-07-28: 10:00:00 700 mg via INTRAVENOUS
  Filled 2019-07-28: qty 20

## 2019-07-28 MED ORDER — DIPHENHYDRAMINE HCL 50 MG/ML IJ SOLN: 50.0000 mg | Freq: Once | INTRAMUSCULAR | Status: DC | PRN

## 2019-07-28 MED ORDER — METHYLPREDNISOLONE SODIUM SUCC 125 MG IJ SOLR: 125.0000 mg | Freq: Once | INTRAMUSCULAR | Status: DC | PRN

## 2019-07-28 MED ORDER — SODIUM CHLORIDE 0.9 % IV SOLN
INTRAVENOUS | Status: DC | PRN
  Administered 2019-07-28: 250 mL via INTRAVENOUS

## 2019-07-28 MED ORDER — ALBUTEROL SULFATE HFA 108 (90 BASE) MCG/ACT IN AERS: 2.0000 | INHALATION_SPRAY | Freq: Once | RESPIRATORY_TRACT | Status: DC | PRN

## 2019-07-28 MED ORDER — EPINEPHRINE 0.3 MG/0.3ML IJ SOAJ: 0.3000 mg | Freq: Once | INTRAMUSCULAR | Status: DC | PRN

## 2019-07-28 NOTE — Discharge Instructions (Signed)
COVID-19: How to Protect Yourself and Others Know how it spreads  There is currently no vaccine to prevent coronavirus disease 2019 (COVID-19).  The best way to prevent illness is to avoid being exposed to this virus.  The virus is thought to spread mainly from person-to-person. ? Between people who are in close contact with one another (within about 6 feet). ? Through respiratory droplets produced when an infected person coughs, sneezes or talks. ? These droplets can land in the mouths or noses of people who are nearby or possibly be inhaled into the lungs. ? Some recent studies have suggested that COVID-19 may be spread by people who are not showing symptoms. Everyone should Clean your hands often  Wash your hands often with soap and water for at least 20 seconds especially after you have been in a public place, or after blowing your nose, coughing, or sneezing.  If soap and water are not readily available, use a hand sanitizer that contains at least 60% alcohol. Cover all surfaces of your hands and rub them together until they feel dry.  Avoid touching your eyes, nose, and mouth with unwashed hands. Avoid close contact  Stay home if you are sick.  Avoid close contact with people who are sick.  Put distance between yourself and other people. ? Remember that some people without symptoms may be able to spread virus. ? This is especially important for people who are at higher risk of getting very sick.www.cdc.gov/coronavirus/2019-ncov/need-extra-precautions/people-at-higher-risk.html Cover your mouth and nose with a cloth face cover when around others  You could spread COVID-19 to others even if you do not feel sick.  Everyone should wear a cloth face cover when they have to go out in public, for example to the grocery store or to pick up other necessities. ? Cloth face coverings should not be placed on young children under age 2, anyone who has trouble breathing, or is unconscious,  incapacitated or otherwise unable to remove the mask without assistance.  The cloth face cover is meant to protect other people in case you are infected.  Do NOT use a facemask meant for a healthcare worker.  Continue to keep about 6 feet between yourself and others. The cloth face cover is not a substitute for social distancing. Cover coughs and sneezes  If you are in a private setting and do not have on your cloth face covering, remember to always cover your mouth and nose with a tissue when you cough or sneeze or use the inside of your elbow.  Throw used tissues in the trash.  Immediately wash your hands with soap and water for at least 20 seconds. If soap and water are not readily available, clean your hands with a hand sanitizer that contains at least 60% alcohol. Clean and disinfect  Clean AND disinfect frequently touched surfaces daily. This includes tables, doorknobs, light switches, countertops, handles, desks, phones, keyboards, toilets, faucets, and sinks. www.cdc.gov/coronavirus/2019-ncov/prevent-getting-sick/disinfecting-your-home.html  If surfaces are dirty, clean them: Use detergent or soap and water prior to disinfection.  Then, use a household disinfectant. You can see a list of EPA-registered household disinfectants here. cdc.gov/coronavirus 12/15/2018 This information is not intended to replace advice given to you by your health care provider. Make sure you discuss any questions you have with your health care provider. Document Released: 11/24/2018 Document Revised: 12/23/2018 Document Reviewed: 11/24/2018 Elsevier Patient Education  2020 Elsevier Inc.  

## 2019-07-28 NOTE — Progress Notes (Addendum)
  Diagnosis: COVID-19  Physician: Dr. Quay Burow  Procedure: [facility] Infusion [facility] Med: bamlanivimab infusion - Provided patient with bamlanimivab fact sheet for patients, parents and caregivers prior to infusion.  Complications: No immediate complications noted.  Discharge: Discharged home   Latricia Cerrito N Joletta Manner 07/28/2019

## 2019-08-04 ENCOUNTER — Ambulatory Visit: Payer: Medicare Other | Attending: Internal Medicine

## 2019-08-04 DIAGNOSIS — Z20822 Contact with and (suspected) exposure to covid-19: Secondary | ICD-10-CM

## 2019-08-05 LAB — NOVEL CORONAVIRUS, NAA: SARS-CoV-2, NAA: NOT DETECTED

## 2019-11-09 ENCOUNTER — Encounter: Payer: Self-pay | Admitting: Internal Medicine

## 2019-11-24 ENCOUNTER — Encounter: Payer: Self-pay | Admitting: Internal Medicine

## 2019-11-29 ENCOUNTER — Encounter: Payer: Self-pay | Admitting: Cardiovascular Disease

## 2019-11-29 ENCOUNTER — Other Ambulatory Visit: Payer: Self-pay

## 2019-11-29 ENCOUNTER — Ambulatory Visit: Payer: Medicare Other | Admitting: Cardiovascular Disease

## 2019-11-29 VITALS — BP 132/78 | HR 55 | Ht 69.0 in | Wt 163.0 lb

## 2019-11-29 DIAGNOSIS — I340 Nonrheumatic mitral (valve) insufficiency: Secondary | ICD-10-CM | POA: Diagnosis not present

## 2019-11-29 NOTE — Progress Notes (Signed)
c  Chief Complaint  Patient presents with  . Follow-up    mitral regurgitation   History of Present Illness: 74 yo male with history of mitral regurgitation, anxiety and GERD who is here today for cardiac follow up. He had been followed by Dr. Johnsie Cancel in the past and has been followed in my office since 2014. Echo January 2014 with mild MR. Stress myoview March 2016 with LVEF=59%, no ischemia. Echo 08/23/15 with normal LV size and function, mild MR. Most recent echo January 2020 with LVEF=60-65% with mild to moderate MR. He had Covid-19 in December 2020 and did well with no respiratory issues.  He is here today for follow up. The patient denies any chest pain, dyspnea, palpitations, lower extremity edema, orthopnea, PND, dizziness, near syncope or syncope. He is riding his bike over 150 miles per week.   Primary Care Physician: Binnie Rail, MD  Past Medical History:  Diagnosis Date  . Anxiety 01/02/2011  . GERD 05/24/2008  . 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 Gynecomastia   . Gynecomastia, male 07/22/2012  . Heart murmur   . History of colonic polyps 09/16/2014  . MITRAL REGURGITATION 11/15/2008   Mild to moderate by echo 2010  . Prostate cancer [facility]    Dr Alinda Money  . Spinal stenosis 01/02/2011    Past Surgical History:  Procedure Laterality Date  . COLONOSCOPY     2010 per pt- normal per Mr. Bentz   . colonoscopy with polypectomy  1997   neg since; Dr Peters, [place]  . LUMBAR DISC SURGERY  11/2010   L4-5 discectomy & laminectomy, Dr Ellene Route   . LUMBAR DISC SURGERY  01/2011   L4-5 discectomy,Dr Elsner  . LUMBAR DISC SURGERY  09/2011   L4-5 minimally invasive fusion , Dr Myrene Buddy, [facility]  . POLYPECTOMY     1997 polyps   . PROSTATECTOMY  2011  . TOOTH EXTRACTION      Current Outpatient Medications  Medication Sig Dispense Refill  . Ascorbic Acid (VITAMIN C) 1000 MG tablet Take 1,000 mg by mouth daily.    . ASHWAGANDHA PO Take by mouth.    . Cholecalciferol (VITAMIN D3) 25  MCG TABS Take 25 mcg by mouth daily. 50 mg daily    . diazepam (VALIUM) 2 MG tablet Take 1 tablet (2 mg total) by mouth daily as needed. for anxiety 90 tablet 0  . mirtazapine (REMERON) 7.5 MG tablet TAKE 1 TABLET BY MOUTH AT  BEDTIME 90 tablet 3  . Multiple Vitamin (MULTIVITAMIN) capsule Take 1 capsule by mouth daily.      . Omega-3 Fatty Acids (FISH OIL) 1000 MG CAPS Take 1,200 mg by mouth daily.     . Zinc 25 MG TABS Take by mouth. 3 x a week Takes Friday Saturday and Sunday     No current facility-administered medications for this visit.    Allergies  Allergen Reactions  . Lexapro [Escitalopram]   . Prozac [Fluoxetine Hcl]     Ramped up anxiety   . Zoloft [Sertraline Hcl] Other (See Comments)    "ramped anxiety up"  Same effect with Prozac & Lexapro    Social History   Socioeconomic History  . Marital status: Married    Spouse name: Not on file  . Number of children: 2  . Years of education: Not on file  . Highest education level: Not on file  Occupational History  . Occupation: Retired-works part time at [employer]: [employer]  Tobacco Use  . Smoking status: Former Smoker    Packs/day: 1.00    Years: 10.00    Pack years: 10.00    Types: Cigarettes    Quit date: 08/12/1973    Years since quitting: 46.3  . Smokeless tobacco: Former Systems developer    Types: Chew  . Tobacco comment: smoked 1960-1975, up to 1 ppd  Substance and Sexual Activity  . Alcohol use: Yes    Alcohol/week: 7.0 standard drinks    Types: 7 Standard drinks or equivalent per week    Comment:  < 1/day  . Drug use: No  . Sexual activity: Not on file  Other Topics Concern  . Not on file  Social History Narrative  . Not on file   Social Determinants of Health   Financial Resource Strain:   . Difficulty of Paying Living Expenses:   Food Insecurity:   . Worried About Charity fundraiser in the Last Year:   . Arboriculturist in the Last Year:   Transportation Needs:   . Lexicographer (Medical):   Marland Kitchen Lack of Transportation (Non-Medical):   Physical Activity:   . Days of Exercise per Week:   . Minutes of Exercise per Session:   Stress:   . Feeling of Stress :   Social Connections:   . Frequency of Communication with Friends and Family:   . Frequency of Social Gatherings with Friends and Family:   . Attends Religious Services:   . Active Member of Clubs or Organizations:   . Attends Archivist Meetings:   Marland Kitchen Marital Status:   Intimate Partner Violence:   . Fear of Current or Ex-Partner:   . Emotionally Abused:   Marland Kitchen Physically Abused:   . Sexually Abused:     Family History  Problem Relation Age of Onset  . Anxiety disorder Mother   . Diabetes Mother        not definite  . Bladder Cancer Mother   . Anxiety disorder Son   . Anxiety disorder Brother   . Atrial fibrillation Brother   . Alcohol abuse Father   . CAD Neg Hx   . Stroke Neg Hx   . Heart disease Neg Hx   . Colon cancer Neg Hx   . Colon polyps Neg Hx   . Esophageal cancer Neg Hx   . Rectal cancer Neg Hx   . Stomach cancer Neg Hx     Review of Systems:  As stated in the HPI and otherwise negative.   BP 132/78   Pulse (!) 55   Ht 5\' 9"  (1.753 m)   Wt 163 lb (73.9 kg)   SpO2 97%   BMI 24.07 kg/m   Physical Examination: General: Well developed, well nourished, NAD  HEENT: OP clear, mucus membranes moist  SKIN: warm, dry. No rashes. Neuro: No focal deficits  Musculoskeletal: Muscle strength 5/5 all ext  Psychiatric: Mood and affect normal  Neck: No JVD, no carotid bruits, no thyromegaly, no lymphadenopathy.  Lungs:Clear bilaterally, no wheezes, rhonci, crackles Cardiovascular: Regular rate and rhythm. Systolic murmur.  Abdomen:Soft. Bowel sounds present. Non-tender.  Extremities: No lower extremity edema. Pulses are 2 + in the bilateral DP/PT.   Echo January 2020: Left ventricle: The cavity size was normal. Wall thickness was  increased in a pattern of mild  LVH. Systolic function was normal.  The estimated ejection fraction was in the range of 60% to 65%.  Wall motion was  normal; there were no regional wall motion  abnormalities. Features are consistent with a pseudonormal left  ventricular filling pattern, with concomitant abnormal relaxation  and increased filling pressure (grade 2 diastolic dysfunction).  - Aortic valve: Valve mobility was mildly restricted. Transvalvular  velocity was within the normal range. There was no stenosis.  There was trivial regurgitation. Mean gradient (S): 6 mm Hg.  - Mitral valve: Borderline bileaflet prolapse without apparent  flail segment. There was mild to moderate regurgitation  (quantitation and pulmonary vein Doppler not performed).  - Left atrium: The atrium was normal in size.  - Right ventricle: The cavity size was normal. Wall thickness was  normal. Systolic function was normal. RV systolic pressure (S,  est): 40 mm Hg.  - Right atrium: The atrium was normal in size. Central venous  pressure (est): 10 mm Hg.  - Tricuspid valve: There was trivial regurgitation.  - Inferior vena cava: The vessel was dilated. The respirophasic  diameter changes were in the normal range (= 50%).  - Pericardium, extracardiac: There was no pericardial effusion.  - High flow velocities may be secondary to anemia, thyrotoxicosis,  or high flow state.   EKG:  EKG is ordered today. The ekg ordered today demonstrates Sinus brady. Incomplete RBBB. Chronic T wave inversion inferior and anterolateral leads.   Recent Labs: 07/13/2019: ALT 19; BUN 19; Creatinine, Ser 0.79; Potassium 4.3; Sodium 138   Lipid Panel    Component Value Date/Time   CHOL 205 (H) 07/06/2018 0936   TRIG 51.0 07/06/2018 0936   HDL 91.40 07/06/2018 0936   CHOLHDL 2 07/06/2018 0936   VLDL 10.2 07/06/2018 0936   LDLCALC 103 (H) 07/06/2018 0936   LDLDIRECT 119.9 09/24/2013 1125     Wt Readings from Last 3 Encounters:   11/29/19 163 lb (73.9 kg)  07/13/19 161 lb 12.8 oz (73.4 kg)  01/28/19 154 lb (69.9 kg)     Other studies Reviewed: Additional studies/ records that were reviewed today include: . Review of the above records demonstrates:    Assessment and Plan:   1. Mitral regurgitation: Mild to moderate by echo in January 2020. I personally reviewed the echo images today. Will repeat echo in January 2022  2. Palpitations/PACs: No palpitations.   Current medicines are reviewed at length with the patient today.  The patient does not have concerns regarding medicines.  The following changes have been made:  no change  Labs/ tests ordered today include:   Orders Placed This Encounter  Procedures  . EKG 12-Lead  . ECHOCARDIOGRAM COMPLETE    Disposition:   FU with me in 12  months  Signed, Lauree Chandler, MD 11/29/2019 10:06 AM    [facility] [address], [place], [place]  [address] Phone: [phone]; Fax: [phone]     Lauree Chandler

## 2019-11-29 NOTE — Patient Instructions (Signed)
Medication Instructions:  No cghanges *If you need a refill on your cardiac medications before your next appointment, please call your pharmacy*   Lab Work: none If you have labs (blood work) drawn today and your tests are completely normal, you will receive your results only by: Marland Kitchen MyChart Message (if you have MyChart) OR . A paper copy in the mail If you have any lab test that is abnormal or we need to change your treatment, we will call you to review the results.   Testing/Procedures: ECHO DUE JAN 2022 Your physician has requested that you have an echocardiogram. Echocardiography is a painless test that uses sound waves to create images of your heart. It provides your doctor with information about the size and shape of your heart and how well your heart's chambers and valves are working. This procedure takes approximately one hour. There are no restrictions for this procedure.   Follow-Up: At [facility], you and your health needs are our priority.  As part of our continuing mission to provide you with exceptional heart care, we have created designated Provider Care Teams.  These Care Teams include your primary Cardiologist (physician) and Advanced Practice Providers (APPs -  Physician Assistants and Nurse Practitioners) who all work together to provide you with the care you need, when you need it.  Your next appointment:   12 month(s)  The format for your next appointment:   In Person  Provider:   You may see Lauree Chandler, MD or one of the following Advanced Practice Providers on your designated Care Team:    Melina Copa, PA-C  Ermalinda Barrios, PA-C    Other Instructions

## 2020-04-09 ENCOUNTER — Other Ambulatory Visit: Payer: Self-pay | Admitting: Internal Medicine

## 2020-06-09 ENCOUNTER — Other Ambulatory Visit: Payer: Self-pay

## 2020-06-09 ENCOUNTER — Ambulatory Visit (INDEPENDENT_AMBULATORY_CARE_PROVIDER_SITE_OTHER): Payer: Medicare Other

## 2020-06-09 DIAGNOSIS — Z23 Encounter for immunization: Secondary | ICD-10-CM | POA: Diagnosis not present

## 2020-07-10 ENCOUNTER — Other Ambulatory Visit: Payer: Self-pay | Admitting: Internal Medicine

## 2020-07-11 NOTE — Patient Instructions (Addendum)
Blood work was ordered.     No immunization administered today.   Medications changes include :   none    Please followup in 1 year    Health Maintenance, Male Adopting a healthy lifestyle and getting preventive care are important in promoting health and wellness. Ask your health care provider about:  The right schedule for you to have regular tests and exams.  Things you can do on your own to prevent diseases and keep yourself healthy. What should I know about diet, weight, and exercise? Eat a healthy diet   Eat a diet that includes plenty of vegetables, fruits, low-fat dairy products, and lean protein.  Do not eat a lot of foods that are high in solid fats, added sugars, or sodium. Maintain a healthy weight Body mass index (BMI) is a measurement that can be used to identify possible weight problems. It estimates body fat based on height and weight. Your health care provider can help determine your BMI and help you achieve or maintain a healthy weight. Get regular exercise Get regular exercise. This is one of the most important things you can do for your health. Most adults should:  Exercise for at least 150 minutes each week. The exercise should increase your heart rate and make you sweat (moderate-intensity exercise).  Do strengthening exercises at least twice a week. This is in addition to the moderate-intensity exercise.  Spend less time sitting. Even light physical activity can be beneficial. Watch cholesterol and blood lipids Have your blood tested for lipids and cholesterol at 75 years of age, then have this test every 5 years. You may need to have your cholesterol levels checked more often if:  Your lipid or cholesterol levels are high.  You are older than 75 years of age.  You are at high risk for heart disease. What should I know about cancer screening? Many types of cancers can be detected early and may often be prevented. Depending on your health history  and family history, you may need to have cancer screening at various ages. This may include screening for:  Colorectal cancer.  Prostate cancer.  Skin cancer.  Lung cancer. What should I know about heart disease, diabetes, and high blood pressure? Blood pressure and heart disease  High blood pressure causes heart disease and increases the risk of stroke. This is more likely to develop in people who have high blood pressure readings, are of African descent, or are overweight.  Talk with your health care provider about your target blood pressure readings.  Have your blood pressure checked: ? Every 3-5 years if you are 18-39 years of age. ? Every year if you are 40 years old or older.  If you are between the ages of 65 and 75 and are a current or former smoker, ask your health care provider if you should have a one-time screening for abdominal aortic aneurysm (AAA). Diabetes Have regular diabetes screenings. This checks your fasting blood sugar level. Have the screening done:  Once every three years after age 45 if you are at a normal weight and have a low risk for diabetes.  More often and at a younger age if you are overweight or have a high risk for diabetes. What should I know about preventing infection? Hepatitis B If you have a higher risk for hepatitis B, you should be screened for this virus. Talk with your health care provider to find out if you are at risk for hepatitis B infection. Hepatitis C   Blood testing is recommended for:  Everyone born from 1945 through 1965.  Anyone with known risk factors for hepatitis C. Sexually transmitted infections (STIs)  You should be screened each year for STIs, including gonorrhea and chlamydia, if: ? You are sexually active and are younger than 75 years of age. ? You are older than 75 years of age and your health care provider tells you that you are at risk for this type of infection. ? Your sexual activity has changed since you were  last screened, and you are at increased risk for chlamydia or gonorrhea. Ask your health care provider if you are at risk.  Ask your health care provider about whether you are at high risk for HIV. Your health care provider may recommend a prescription medicine to help prevent HIV infection. If you choose to take medicine to prevent HIV, you should first get tested for HIV. You should then be tested every 3 months for as long as you are taking the medicine. Follow these instructions at home: Lifestyle  Do not use any products that contain nicotine or tobacco, such as cigarettes, e-cigarettes, and chewing tobacco. If you need help quitting, ask your health care provider.  Do not use street drugs.  Do not share needles.  Ask your health care provider for help if you need support or information about quitting drugs. Alcohol use  Do not drink alcohol if your health care provider tells you not to drink.  If you drink alcohol: ? Limit how much you have to 0-2 drinks a day. ? Be aware of how much alcohol is in your drink. In the [place] one drink equals one 12 oz bottle of beer (355 mL), one 5 oz glass of wine (148 mL), or one 1 oz glass of hard liquor (44 mL). General instructions  Schedule regular health, dental, and eye exams.  Stay current with your vaccines.  Tell your health care provider if: ? You often feel depressed. ? You have ever been abused or do not feel safe at home. Summary  Adopting a healthy lifestyle and getting preventive care are important in promoting health and wellness.  Follow your health care provider's instructions about healthy diet, exercising, and getting tested or screened for diseases.  Follow your health care provider's instructions on monitoring your cholesterol and blood pressure. This information is not intended to replace advice given to you by your health care provider. Make sure you discuss any questions you have with your health care  provider. Document Revised: 07/22/2018 Document Reviewed: 07/22/2018 Elsevier Patient Education  2020 Elsevier Inc.  

## 2020-07-11 NOTE — Progress Notes (Signed)
Subjective:    Patient ID: Edward Mccall, male    DOB: 1946-03-17, 74 y.o.   MRN: [id number]  HPI He is here for a physical exam.     Medications and allergies reviewed with patient and updated if appropriate.  Patient Active Problem List   Diagnosis Date Noted  . Testosterone deficiency in male 06/30/2017  . History of colonic polyps 09/16/2014  . Degeneration of intervertebral disc of lumbosacral region 10/29/2011  . Elevated blood pressure reading 06/25/2011  . Lipoma 04/06/2011  . Anxiety 01/02/2011  . Spinal stenosis 01/02/2011  . PROSTATE CANCER, HX OF 06/20/2010  . MITRAL REGURGITATION, mild- mod 11/15/2008  . Supraventricular premature beats 11/15/2008  . [place] SYNDROME 10/21/2008  . Prediabetes 10/21/2008    Current Outpatient Medications on File Prior to Visit  Medication Sig Dispense Refill  . Ascorbic Acid (VITAMIN C) 1000 MG tablet Take 1,000 mg by mouth daily.    . ASHWAGANDHA PO Take by mouth.    . Cholecalciferol (VITAMIN D3) 25 MCG TABS Take 25 mcg by mouth daily. 50 mg daily    . Cyanocobalamin (B-12) 1000 MCG CAPS Take by mouth.    . diazepam (VALIUM) 2 MG tablet Take 1 tablet (2 mg total) by mouth daily as needed. for anxiety 90 tablet 0  . mirtazapine (REMERON) 7.5 MG tablet TAKE 1 TABLET BY MOUTH AT  BEDTIME 90 tablet 3  . Zinc 25 MG TABS Take by mouth. 3 x a week Takes Friday Saturday and "Sunday    . Multiple Vitamin (MULTIVITAMIN) capsule Take 1 capsule by mouth daily.   (Patient not taking: Reported on 07/12/2020)    . Omega-3 Fatty Acids (FISH OIL) 1000 MG CAPS Take 1,200 mg by mouth daily.  (Patient not taking: Reported on 07/12/2020)     No current facility-administered medications on file prior to visit.    Past Medical History:  Diagnosis Date  . Anxiety 01/02/2011  . GERD 05/24/2008  . 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 Gynecomastia   . Gynecomastia, male 07/22/2012  . Heart murmur   . History of colonic polyps 09/16/2014  . MITRAL  REGURGITATION 11/15/2008   Mild to moderate by echo 2010  . Prostate cancer (HCC)    Dr Borden  . Spinal stenosis 01/02/2011    Past Surgical History:  Procedure Laterality Date  . COLONOSCOPY     2010 per pt- normal per Mr. Schley   . colonoscopy with polypectomy  1997   neg since; Dr Peters, [facility]  . LUMBAR DISC SURGERY  11/2010   L4-5 discectomy & laminectomy, Dr Elsner   . LUMBAR DISC SURGERY  01/2011   L4-5 discectomy,Dr Elsner  . LUMBAR DISC SURGERY  09/2011   L4-5 minimally invasive fusion , Dr Issacs, [facility]  . POLYPECTOMY     19" 97 polyps   . PROSTATECTOMY  2011  . TOOTH EXTRACTION      Social History   Socioeconomic History  . Marital status: Married    Spouse name: Not on file  . Number of children: 2  . Years of education: Not on file  . Highest education level: Not on file  Occupational History  . Occupation: Retired-works part time at [employer]: [place]  Tobacco Use  . Smoking status: Former Smoker    Packs/day: 1.00    Years: 10.00    Pack years: 10.00    Types: Cigarettes    Quit date: 08/12/1973  Years since quitting: 46.9  . Smokeless tobacco: Former Systems developer    Types: Chew  . Tobacco comment: smoked 1960-1975, up to 1 ppd  Vaping Use  . Vaping Use: Never used  Substance and Sexual Activity  . Alcohol use: Yes    Alcohol/week: 7.0 standard drinks    Types: 7 Standard drinks or equivalent per week    Comment:  < 1/day  . Drug use: No  . Sexual activity: Not on file  Other Topics Concern  . Not on file  Social History Narrative  . Not on file   Social Determinants of Health   Financial Resource Strain:   . Difficulty of Paying Living Expenses: Not on file  Food Insecurity:   . Worried About Charity fundraiser in the Last Year: Not on file  . Ran Out of Food in the Last Year: Not on file  Transportation Needs:   . Lack of Transportation (Medical): Not on file  . Lack of Transportation (Non-Medical): Not on  file  Physical Activity:   . Days of Exercise per Week: Not on file  . Minutes of Exercise per Session: Not on file  Stress:   . Feeling of Stress : Not on file  Social Connections:   . Frequency of Communication with Friends and Family: Not on file  . Frequency of Social Gatherings with Friends and Family: Not on file  . Attends Religious Services: Not on file  . Active Member of Clubs or Organizations: Not on file  . Attends Archivist Meetings: Not on file  . Marital Status: Not on file    Family History  Problem Relation Age of Onset  . Anxiety disorder Mother   . Diabetes Mother        not definite  . Bladder Cancer Mother   . Anxiety disorder Son   . Anxiety disorder Brother   . Atrial fibrillation Brother   . Alcohol abuse Father   . CAD Neg Hx   . Stroke Neg Hx   . Heart disease Neg Hx   . Colon cancer Neg Hx   . Colon polyps Neg Hx   . Esophageal cancer Neg Hx   . Rectal cancer Neg Hx   . Stomach cancer Neg Hx     Review of Systems  Constitutional: Negative for chills and fever.  Eyes: Negative for visual disturbance.  Respiratory: Negative for cough, shortness of breath and wheezing.   Cardiovascular: Positive for palpitations (occ, chronic). Negative for chest pain and leg swelling.  Gastrointestinal: Negative for abdominal pain, blood in stool, constipation, diarrhea and nausea.       No gerd  Genitourinary: Negative for difficulty urinating, dysuria and hematuria.  Musculoskeletal: Negative for arthralgias and back pain.  Skin: Negative for color change and rash.  Neurological: Positive for headaches (occ in morning, goes away w/o treatment). Negative for dizziness and light-headedness.  Psychiatric/Behavioral: Negative for dysphoric mood. The patient is nervous/anxious.        Objective:   Vitals:   07/12/20 0745  BP: 128/80  Pulse: (!) 43  Temp: 98 F (36.7 C)  SpO2: 99%   Filed Weights   07/12/20 0745  Weight: 157 lb (71.2 kg)    Body mass index is 23.18 kg/m.  BP Readings from Last 3 Encounters:  07/12/20 128/80  11/29/19 132/78  07/28/19 131/71    Wt Readings from Last 3 Encounters:  07/12/20 157 lb (71.2 kg)  11/29/19 163 lb (73.9 kg)  07/13/19 161 lb 12.8 oz (73.4 kg)     Physical Exam Constitutional: He appears well-developed and well-nourished. No distress.  HENT:  Head: Normocephalic and atraumatic.  Right Ear: External ear normal.  Left Ear: External ear normal.  Mouth/Throat: Oropharynx is clear and moist.  Normal ear canals and TM b/l  Eyes: Conjunctivae and EOM are normal.  Neck: Neck supple. No tracheal deviation present. No thyromegaly present.  No carotid bruit  Cardiovascular: Normal rate, regular rhythm, normal heart sounds and intact distal pulses.   No murmur heard. Pulmonary/Chest: Effort normal and breath sounds normal. No respiratory distress. He has no wheezes. He has no rales.  Abdominal: Soft. He exhibits no distension. There is no tenderness.  Genitourinary: deferred  Musculoskeletal: He exhibits no edema.  Lymphadenopathy:   He has no cervical adenopathy.  Skin: Skin is warm and dry. He is not diaphoretic.  Psychiatric: He has a normal mood and affect. His behavior is normal.         Assessment & Plan:   Physical exam: Screening blood work  ordered Immunizations  Discussed tdap, shingrix Colonoscopy   Up to date  Eye exams   Due - will schedule Exercise   Rides bike, weights 3/week Weight   - good Substance abuse   none  See Problem List for Assessment and Plan of chronic medical problems.   This visit occurred during the SARS-CoV-2 public health emergency.  Safety protocols were in place, including screening questions prior to the visit, additional usage of staff PPE, and extensive cleaning of exam room while observing appropriate contact time as indicated for disinfecting solutions.

## 2020-07-12 ENCOUNTER — Encounter: Payer: Self-pay | Admitting: Internal Medicine

## 2020-07-12 ENCOUNTER — Other Ambulatory Visit: Payer: Self-pay

## 2020-07-12 ENCOUNTER — Ambulatory Visit (INDEPENDENT_AMBULATORY_CARE_PROVIDER_SITE_OTHER): Payer: Medicare Other | Admitting: Internal Medicine

## 2020-07-12 VITALS — BP 128/80 | HR 43 | Temp 98.0°F | Ht 69.0 in | Wt 157.0 lb

## 2020-07-12 DIAGNOSIS — Z8546 Personal history of malignant neoplasm of prostate: Secondary | ICD-10-CM | POA: Diagnosis not present

## 2020-07-12 DIAGNOSIS — F419 Anxiety disorder, unspecified: Secondary | ICD-10-CM

## 2020-07-12 DIAGNOSIS — R7303 Prediabetes: Secondary | ICD-10-CM | POA: Diagnosis not present

## 2020-07-12 DIAGNOSIS — Z0000 Encounter for general adult medical examination without abnormal findings: Secondary | ICD-10-CM

## 2020-07-12 DIAGNOSIS — F411 Generalized anxiety disorder: Secondary | ICD-10-CM

## 2020-07-12 LAB — CBC WITH DIFFERENTIAL/PLATELET
Basophils Absolute: 0.1 10*3/uL (ref 0.0–0.1)
Basophils Relative: 1.3 % (ref 0.0–3.0)
Eosinophils Absolute: 0.1 10*3/uL (ref 0.0–0.7)
Eosinophils Relative: 3.5 % (ref 0.0–5.0)
HCT: 42 % (ref 39.0–52.0)
Hemoglobin: 14.4 g/dL (ref 13.0–17.0)
Lymphocytes Relative: 27.4 % (ref 12.0–46.0)
Lymphs Abs: 1.1 10*3/uL (ref 0.7–4.0)
MCHC: 34.2 g/dL (ref 30.0–36.0)
MCV: 92.1 fl (ref 78.0–100.0)
Monocytes Absolute: 0.3 10*3/uL (ref 0.1–1.0)
Monocytes Relative: 7.5 % (ref 3.0–12.0)
Neutro Abs: 2.3 10*3/uL (ref 1.4–7.7)
Neutrophils Relative %: 60.3 % (ref 43.0–77.0)
Platelets: 202 10*3/uL (ref 150.0–400.0)
RBC: 4.57 Mil/uL (ref 4.22–5.81)
RDW: 13.3 % (ref 11.5–15.5)
WBC: 3.9 10*3/uL — ABNORMAL LOW (ref 4.0–10.5)

## 2020-07-12 LAB — COMPREHENSIVE METABOLIC PANEL
ALT: 16 U/L (ref 0–53)
AST: 20 U/L (ref 0–37)
Albumin: 4.4 g/dL (ref 3.5–5.2)
Alkaline Phosphatase: 57 U/L (ref 39–117)
BUN: 15 mg/dL (ref 6–23)
CO2: 32 mEq/L (ref 19–32)
Calcium: 9.5 mg/dL (ref 8.4–10.5)
Chloride: 102 mEq/L (ref 96–112)
Creatinine, Ser: 0.84 mg/dL (ref 0.40–1.50)
GFR: 85.54 mL/min (ref >60.00)
Glucose, Bld: 108 mg/dL — ABNORMAL HIGH (ref 70–99)
Potassium: 4.8 mEq/L (ref 3.5–5.1)
Sodium: 140 mEq/L (ref 135–145)
Total Bilirubin: 0.7 mg/dL (ref 0.2–1.2)
Total Protein: 6.6 g/dL (ref 6.0–8.3)

## 2020-07-12 LAB — LIPID PANEL
Cholesterol: 209 mg/dL — ABNORMAL HIGH (ref 0–200)
HDL: 90.2 mg/dL (ref >39.00)
LDL Cholesterol: 108 mg/dL — ABNORMAL HIGH (ref 0–99)
NonHDL: 119.19
Total CHOL/HDL Ratio: 2
Triglycerides: 58 mg/dL (ref 0.0–149.0)
VLDL: 11.6 mg/dL (ref 0.0–40.0)

## 2020-07-12 LAB — PSA: PSA: 0.05 ng/mL — ABNORMAL LOW (ref 0.10–4.00)

## 2020-07-12 LAB — TSH: TSH: 1.46 u[IU]/mL (ref 0.35–4.50)

## 2020-07-12 LAB — HEMOGLOBIN A1C: Hgb A1c MFr Bld: 5.6 % (ref 4.6–6.5)

## 2020-07-12 MED ORDER — DIAZEPAM 2 MG PO TABS: 2.0000 mg | ORAL_TABLET | Freq: Every day | ORAL | 0 refills | Status: DC | PRN

## 2020-07-12 NOTE — Assessment & Plan Note (Addendum)
Chronic Takes 1 mg of valium once a week - once every three weeks He usually takes it at night Continue valium 1 mg as needed - takes infrequently Continue remeron 7.5 mg nightly

## 2020-07-12 NOTE — Assessment & Plan Note (Signed)
Chronic Check a1c Low sugar / carb diet continue regular exercise

## 2020-07-12 NOTE — Addendum Note (Signed)
Addended by: Cresenciano Lick on: 07/12/2020 08:31 AM   Modules accepted: Orders

## 2020-07-12 NOTE — Assessment & Plan Note (Signed)
Chronic No longer seeing urology Will check psa

## 2020-07-14 ENCOUNTER — Ambulatory Visit: Payer: Medicare Other | Attending: Internal Medicine

## 2020-07-14 DIAGNOSIS — Z23 Encounter for immunization: Secondary | ICD-10-CM

## 2020-07-14 NOTE — Progress Notes (Signed)
   Covid-19 Vaccination Clinic  Name:  Edward Mccall    MRN: [id number] DOB: December 06, 1945  07/14/2020  [name] was observed post Covid-19 immunization for 15 minutes without incident. He was provided with Vaccine Information Sheet and instruction to access the V-Safe system.   [name] was instructed to call 911 with any severe reactions post vaccine: Marland Kitchen Difficulty breathing  . Swelling of face and throat  . A fast heartbeat  . A bad rash all over body  . Dizziness and weakness   Immunizations Administered    Name Date Dose VIS Date Route   Pfizer COVID-19 Vaccine 07/14/2020  1:30 PM 0.3 mL 05/31/2020 Intramuscular   Manufacturer: [place]   Lot: [id number]   [place]ista: [id number]

## 2020-08-30 ENCOUNTER — Other Ambulatory Visit (HOSPITAL_COMMUNITY): Payer: Medicare Other

## 2020-08-30 DIAGNOSIS — D1801 Hemangioma of skin and subcutaneous tissue: Secondary | ICD-10-CM | POA: Diagnosis not present

## 2020-08-30 DIAGNOSIS — L570 Actinic keratosis: Secondary | ICD-10-CM | POA: Diagnosis not present

## 2020-08-30 DIAGNOSIS — L720 Epidermal cyst: Secondary | ICD-10-CM | POA: Diagnosis not present

## 2020-08-30 DIAGNOSIS — L814 Other melanin hyperpigmentation: Secondary | ICD-10-CM | POA: Diagnosis not present

## 2020-08-30 DIAGNOSIS — L578 Other skin changes due to chronic exposure to nonionizing radiation: Secondary | ICD-10-CM | POA: Diagnosis not present

## 2020-09-06 ENCOUNTER — Ambulatory Visit (HOSPITAL_COMMUNITY): Payer: Medicare Other | Attending: Internal Medicine

## 2020-09-06 ENCOUNTER — Other Ambulatory Visit: Payer: Self-pay

## 2020-09-06 DIAGNOSIS — I340 Nonrheumatic mitral (valve) insufficiency: Secondary | ICD-10-CM | POA: Insufficient documentation

## 2020-09-06 LAB — ECHOCARDIOGRAM COMPLETE
Area-P 1/2: 1.9 cm2
MV M vel: 4.93 m/s
MV Peak grad: 97 mmHg
Radius: 0.55 cm
S' Lateral: 3.8 cm

## 2020-09-11 ENCOUNTER — Telehealth: Payer: Self-pay

## 2020-09-11 DIAGNOSIS — I340 Nonrheumatic mitral (valve) insufficiency: Secondary | ICD-10-CM

## 2020-09-11 NOTE — Telephone Encounter (Signed)
-----   Message from Burnell Blanks, MD sent at 09/06/2020  5:55 PM EST ----- His heart is strong. Moderate leakiness of the mitral valve. I would like to repeat his echo in 6 months to reassess. Gerald Stabs

## 2020-09-11 NOTE — Telephone Encounter (Signed)
Advised his echo results and agreed to have repeated in 6 months.

## 2020-11-01 DIAGNOSIS — H2513 Age-related nuclear cataract, bilateral: Secondary | ICD-10-CM | POA: Diagnosis not present

## 2020-11-01 DIAGNOSIS — H524 Presbyopia: Secondary | ICD-10-CM | POA: Diagnosis not present

## 2020-11-15 ENCOUNTER — Telehealth: Payer: Self-pay | Admitting: Internal Medicine

## 2020-11-15 NOTE — Telephone Encounter (Signed)
LVM for pt to ern my call to schedule AWV with [facility]. Please schedule AWV if pt calls the office.

## 2021-01-05 ENCOUNTER — Encounter: Payer: Self-pay | Admitting: Internal Medicine

## 2021-01-05 DIAGNOSIS — F411 Generalized anxiety disorder: Secondary | ICD-10-CM

## 2021-01-05 MED ORDER — DIAZEPAM 2 MG PO TABS: 2.0000 mg | ORAL_TABLET | Freq: Every day | ORAL | 0 refills | Status: DC | PRN

## 2021-01-09 DIAGNOSIS — H2511 Age-related nuclear cataract, right eye: Secondary | ICD-10-CM | POA: Diagnosis not present

## 2021-01-09 DIAGNOSIS — H25811 Combined forms of age-related cataract, right eye: Secondary | ICD-10-CM | POA: Diagnosis not present

## 2021-03-09 ENCOUNTER — Other Ambulatory Visit: Payer: Self-pay

## 2021-03-09 ENCOUNTER — Ambulatory Visit (HOSPITAL_COMMUNITY): Payer: Medicare Other | Attending: Cardiovascular Disease

## 2021-03-09 ENCOUNTER — Encounter (INDEPENDENT_AMBULATORY_CARE_PROVIDER_SITE_OTHER): Payer: Self-pay

## 2021-03-09 DIAGNOSIS — I340 Nonrheumatic mitral (valve) insufficiency: Secondary | ICD-10-CM | POA: Diagnosis not present

## 2021-03-09 LAB — ECHOCARDIOGRAM COMPLETE
AR max vel: 1.67 cm2
AV Area VTI: 1.57 cm2
AV Area mean vel: 1.58 cm2
AV Mean grad: 12.3 mmHg
AV Peak grad: 23.6 mmHg
Ao pk vel: 2.43 m/s
Area-P 1/2: 2.82 cm2
S' Lateral: 3 cm

## 2021-04-24 ENCOUNTER — Encounter: Payer: Self-pay | Admitting: Internal Medicine

## 2021-05-31 ENCOUNTER — Other Ambulatory Visit: Payer: Self-pay | Admitting: Internal Medicine

## 2021-07-15 NOTE — Progress Notes (Signed)
Subjective:    Patient ID: Edward Mccall, male    DOB: [birth year], 75 y.o.   MRN: [id number]   This visit occurred during the SARS-CoV-2 public health emergency.  Safety protocols were in place, including screening questions prior to the visit, additional usage of staff PPE, and extensive cleaning of exam room while observing appropriate contact time as indicated for disinfecting solutions.   HPI He is here for a physical exam.   He denies changes in his health.  He has no concerns.    Medications and allergies reviewed with patient and updated if appropriate.  Patient Active Problem List   Diagnosis Date Noted   Testosterone deficiency in male 06/30/2017   History of colonic polyps 09/16/2014   Degeneration of intervertebral disc of lumbosacral region 10/29/2011   Lipoma 04/06/2011   Anxiety 01/02/2011   Spinal stenosis 01/02/2011   PROSTATE CANCER, HX OF 06/20/2010   MITRAL REGURGITATION, mild- mod 11/15/2008   Supraventricular premature beats 11/15/2008   GILBERT'S SYNDROME 10/21/2008   Prediabetes 10/21/2008    Current Outpatient Medications on File Prior to Visit  Medication Sig Dispense Refill   Ascorbic Acid (VITAMIN C) 1000 MG tablet Take 1,000 mg by mouth daily.     ASHWAGANDHA PO Take by mouth.     Cholecalciferol (VITAMIN D3) 25 MCG TABS Take 25 mcg by mouth daily. 50 mg daily     Coenzyme Q10 (CO Q-10) 300 MG CAPS Take by mouth.     COLLAGEN PO Take by mouth. Patient takes 10 grams daily     CREATINE PO Take 5 g by mouth. 5 grams daily     diazepam (VALIUM) 2 MG tablet Take 1 tablet (2 mg total) by mouth daily as needed for anxiety. 90 tablet 0   Krill Oil 500 MG CAPS Take by mouth.     mirtazapine (REMERON) 7.5 MG tablet TAKE 1 TABLET BY MOUTH AT  BEDTIME 90 tablet 3   Zinc 25 MG TABS Take by mouth. 3 x a week Takes Friday Saturday and "Sunday     Cyanocobalamin (B-12) 1000 MCG CAPS Take by mouth. (Patient not taking: Reported on 07/16/2021)     No  current facility-administered medications on file prior to visit.    Past Medical History:  Diagnosis Date   Anxiety 01/02/2011   GERD 05/24/2008   GILBERT'S SYNDROME 10/21/2008   Gynecomastia    Gynecomastia, male 07/22/2012   Heart murmur    History of colonic polyps 09/16/2014   MITRAL REGURGITATION 11/15/2008   Mild to moderate by echo 2010   Prostate cancer (HCC)    Dr Borden   Spinal stenosis 01/02/2011    Past Surgical History:  Procedure Laterality Date   COLONOSCOPY     2010 per pt- normal per Mr. Primiano    colonoscopy with polypectomy  1997   neg since; Dr Peters, [facility]   LUMBAR DISC SURGERY  11/2010   L4-5 discectomy & laminectomy, Dr Elsner    LUMBAR DISC SURGERY  01/2011   L4-5 discectomy,Dr Elsner   LUMBAR DISC SURGERY  09/2011   L4-5 minimally invasive fusion , Dr Issacs, [facility]   POLYPECTOMY     19" 97 polyps    PROSTATECTOMY  2011   TOOTH EXTRACTION      Social History   Socioeconomic History   Marital status: Married    Spouse name: Not on file   Number of children: 2   Years of education: Not on file  Highest education level: Not on file  Occupational History   Occupation: Retired-works part time at [place] Use   Smoking status: Former    Packs/day: 1.00    Years: 10.00    Pack years: 10.00    Types: Cigarettes    Quit date: 08/12/1973    Years since quitting: 47.9   Smokeless tobacco: Former    Types: Chew   Tobacco comments:    smoked 1960-1975, up to 1 ppd  Vaping Use   Vaping Use: Never used  Substance and Sexual Activity   Alcohol use: Yes    Alcohol/week: 7.0 standard drinks    Types: 7 Standard drinks or equivalent per week    Comment:  < 1/day   Drug use: No   Sexual activity: Not on file  Other Topics Concern   Not on file  Social History Narrative   Not on file   Social Determinants of Health   Financial Resource Strain: Not on file  Food Insecurity: Not on file   Transportation Needs: Not on file  Physical Activity: Not on file  Stress: Not on file  Social Connections: Not on file    Family History  Problem Relation Age of Onset   Anxiety disorder Mother    Diabetes Mother        not definite   Bladder Cancer Mother    Anxiety disorder Son    Anxiety disorder Brother    Atrial fibrillation Brother    Alcohol abuse Father    CAD Neg Hx    Stroke Neg Hx    Heart disease Neg Hx    Colon cancer Neg Hx    Colon polyps Neg Hx    Esophageal cancer Neg Hx    Rectal cancer Neg Hx    Stomach cancer Neg Hx     Review of Systems  Constitutional:  Negative for chills and fever.  Eyes:  Negative for visual disturbance.  Respiratory:  Negative for cough, shortness of breath and wheezing.   Cardiovascular:  Negative for chest pain, palpitations and leg swelling.  Gastrointestinal:  Negative for abdominal pain, blood in stool, constipation, diarrhea and nausea.       No gerd  Genitourinary:  Negative for difficulty urinating, dysuria and hematuria.  Musculoskeletal:  Negative for arthralgias and back pain.  Skin:  Negative for rash.  Neurological:  Negative for light-headedness and headaches.  Psychiatric/Behavioral:  Negative for dysphoric mood. The patient is nervous/anxious (occ).       Objective:   Vitals:   07/16/21 0752  BP: 130/82  Pulse: (!) 51  Temp: 98 F (36.7 C)   Filed Weights   07/16/21 0752  Weight: 170 lb 3.2 oz (77.2 kg)   Body mass index is 25.13 kg/m.  BP Readings from Last 3 Encounters:  07/16/21 130/82  07/12/20 128/80  11/29/19 132/78    Wt Readings from Last 3 Encounters:  07/16/21 170 lb 3.2 oz (77.2 kg)  07/12/20 157 lb (71.2 kg)  11/29/19 163 lb (73.9 kg)    Depression screen [facility] 2/9 07/16/2021 07/13/2019 07/06/2018 06/30/2017 12/05/2016  Decreased Interest 0 0 0 0 0  Down, Depressed, Hopeless 0 0 0 0 0  PHQ - 2 Score 0 0 0 0 0  Altered sleeping 1 - - - -  Tired, decreased energy 0 - - - -   Change in appetite 0 - - - -  Feeling bad or  failure about yourself  0 - - - -  Trouble concentrating 0 - - - -  Moving slowly or fidgety/restless 0 - - - -  Suicidal thoughts 0 - - - -  PHQ-9 Score 1 - - - -  Difficult doing work/chores Not difficult at all - - - -    GAD 7 : Generalized Anxiety Score 07/16/2021  Nervous, Anxious, on Edge 1  Control/stop worrying 0  Worry too much - different things 0  Trouble relaxing 0  Restless 0  Easily annoyed or irritable 0  Afraid - awful might happen 0  Total GAD 7 Score 1  Anxiety Difficulty Not difficult at all       Physical Exam Constitutional: He appears well-developed and well-nourished. No distress.  HENT:  Head: Normocephalic and atraumatic.  Right Ear: External ear normal.  Left Ear: External ear normal.  Mouth/Throat: Oropharynx is clear and moist.  Normal ear canals and TM b/l  Eyes: Conjunctivae and EOM are normal.  Neck: Neck supple. No tracheal deviation present. No thyromegaly present.  No carotid bruit  Cardiovascular: Normal rate, regular rhythm, normal heart sounds and intact distal pulses.  2/6 sys murmur heard. Pulmonary/Chest: Effort normal and breath sounds normal. No respiratory distress. He has no wheezes. He has no rales.  Abdominal: Soft. He exhibits no distension. There is no tenderness.  Genitourinary: deferred  Musculoskeletal: He exhibits no edema.  Lymphadenopathy:   He has no cervical adenopathy.  Skin: Skin is warm and dry. He is not diaphoretic.  Psychiatric: He has a normal mood and affect. His behavior is normal.         Assessment & Plan:   Physical exam: Screening blood work  ordered Exercise   biking, weights 3/week Weight  normal Substance abuse   none   Reviewed recommended immunizations.   Health Maintenance  Topic Date Due   Zoster Vaccines- Shingrix (1 of 2) 10/14/2021 (Originally 05/29/1995)   TETANUS/TDAP  04/25/2031   Pneumonia Vaccine 68+ Years old  Completed    INFLUENZA VACCINE  Completed   COVID-19 Vaccine  Completed   Hepatitis C Screening  Completed   HPV VACCINES  Aged Out   COLONOSCOPY (Pts 45-84yrs Insurance coverage will need to be confirmed)  Discontinued     See Problem List for Assessment and Plan of chronic medical problems.

## 2021-07-15 NOTE — Patient Instructions (Addendum)
Blood work was ordered.     Medications changes include :   none   A referral was ordered for Dr Zadie Rhine.     Please followup in 1 year   Health Maintenance, Male Adopting a healthy lifestyle and getting preventive care are important in promoting health and wellness. Ask your health care provider about: The right schedule for you to have regular tests and exams. Things you can do on your own to prevent diseases and keep yourself healthy. What should I know about diet, weight, and exercise? Eat a healthy diet  Eat a diet that includes plenty of vegetables, fruits, low-fat dairy products, and lean protein. Do not eat a lot of foods that are high in solid fats, added sugars, or sodium. Maintain a healthy weight Body mass index (BMI) is a measurement that can be used to identify possible weight problems. It estimates body fat based on height and weight. Your health care provider can help determine your BMI and help you achieve or maintain a healthy weight. Get regular exercise Get regular exercise. This is one of the most important things you can do for your health. Most adults should: Exercise for at least 150 minutes each week. The exercise should increase your heart rate and make you sweat (moderate-intensity exercise). Do strengthening exercises at least twice a week. This is in addition to the moderate-intensity exercise. Spend less time sitting. Even light physical activity can be beneficial. Watch cholesterol and blood lipids Have your blood tested for lipids and cholesterol at 76 years of age, then have this test every 5 years. You may need to have your cholesterol levels checked more often if: Your lipid or cholesterol levels are high. You are older than 76 years of age. You are at high risk for heart disease. What should I know about cancer screening? Many types of cancers can be detected early and may often be prevented. Depending on your health history and family history,  you may need to have cancer screening at various ages. This may include screening for: Colorectal cancer. Prostate cancer. Skin cancer. Lung cancer. What should I know about heart disease, diabetes, and high blood pressure? Blood pressure and heart disease High blood pressure causes heart disease and increases the risk of stroke. This is more likely to develop in people who have high blood pressure readings or are overweight. Talk with your health care provider about your target blood pressure readings. Have your blood pressure checked: Every 3-5 years if you are 66-30 years of age. Every year if you are 66 years old or older. If you are between the ages of 33 and 65 and are a current or former smoker, ask your health care provider if you should have a one-time screening for abdominal aortic aneurysm (AAA). Diabetes Have regular diabetes screenings. This checks your fasting blood sugar level. Have the screening done: Once every three years after age 49 if you are at a normal weight and have a low risk for diabetes. More often and at a younger age if you are overweight or have a high risk for diabetes. What should I know about preventing infection? Hepatitis B If you have a higher risk for hepatitis B, you should be screened for this virus. Talk with your health care provider to find out if you are at risk for hepatitis B infection. Hepatitis C Blood testing is recommended for: Everyone born from [birth year] through 1966. Anyone with known risk factors for hepatitis C. Sexually transmitted infections (STIs)  You should be screened each year for STIs, including gonorrhea and chlamydia, if: You are sexually active and are younger than 76 years of age. You are older than 76 years of age and your health care provider tells you that you are at risk for this type of infection. Your sexual activity has changed since you were last screened, and you are at increased risk for chlamydia or gonorrhea. Ask  your health care provider if you are at risk. Ask your health care provider about whether you are at high risk for HIV. Your health care provider may recommend a prescription medicine to help prevent HIV infection. If you choose to take medicine to prevent HIV, you should first get tested for HIV. You should then be tested every 3 months for as long as you are taking the medicine. Follow these instructions at home: Alcohol use Do not drink alcohol if your health care provider tells you not to drink. If you drink alcohol: Limit how much you have to 0-2 drinks a day. Know how much alcohol is in your drink. In the [place] one drink equals one 12 oz bottle of beer (355 mL), one 5 oz glass of wine (148 mL), or one 1 oz glass of hard liquor (44 mL). Lifestyle Do not use any products that contain nicotine or tobacco. These products include cigarettes, chewing tobacco, and vaping devices, such as e-cigarettes. If you need help quitting, ask your health care provider. Do not use street drugs. Do not share needles. Ask your health care provider for help if you need support or information about quitting drugs. General instructions Schedule regular health, dental, and eye exams. Stay current with your vaccines. Tell your health care provider if: You often feel depressed. You have ever been abused or do not feel safe at home. Summary Adopting a healthy lifestyle and getting preventive care are important in promoting health and wellness. Follow your health care provider's instructions about healthy diet, exercising, and getting tested or screened for diseases. Follow your health care provider's instructions on monitoring your cholesterol and blood pressure. This information is not intended to replace advice given to you by your health care provider. Make sure you discuss any questions you have with your health care provider. Document Revised: 12/18/2020 Document Reviewed: 12/18/2020 Elsevier Patient  Education  [place].

## 2021-07-16 ENCOUNTER — Ambulatory Visit (INDEPENDENT_AMBULATORY_CARE_PROVIDER_SITE_OTHER): Payer: Medicare Other | Admitting: Internal Medicine

## 2021-07-16 ENCOUNTER — Encounter: Payer: Self-pay | Admitting: Internal Medicine

## 2021-07-16 ENCOUNTER — Other Ambulatory Visit: Payer: Self-pay

## 2021-07-16 VITALS — BP 130/82 | HR 51 | Temp 98.0°F | Ht 69.0 in | Wt 170.2 lb

## 2021-07-16 DIAGNOSIS — R7303 Prediabetes: Secondary | ICD-10-CM

## 2021-07-16 DIAGNOSIS — F419 Anxiety disorder, unspecified: Secondary | ICD-10-CM | POA: Diagnosis not present

## 2021-07-16 DIAGNOSIS — H359 Unspecified retinal disorder: Secondary | ICD-10-CM | POA: Diagnosis not present

## 2021-07-16 DIAGNOSIS — Z0000 Encounter for general adult medical examination without abnormal findings: Secondary | ICD-10-CM | POA: Diagnosis not present

## 2021-07-16 DIAGNOSIS — Z8546 Personal history of malignant neoplasm of prostate: Secondary | ICD-10-CM | POA: Diagnosis not present

## 2021-07-16 LAB — CBC WITH DIFFERENTIAL/PLATELET
Basophils Absolute: 0 10*3/uL (ref 0.0–0.1)
Basophils Relative: 1.1 % (ref 0.0–3.0)
Eosinophils Absolute: 0.2 10*3/uL (ref 0.0–0.7)
Eosinophils Relative: 4.1 % (ref 0.0–5.0)
HCT: 41.1 % (ref 39.0–52.0)
Hemoglobin: 13.8 g/dL (ref 13.0–17.0)
Lymphocytes Relative: 27.7 % (ref 12.0–46.0)
Lymphs Abs: 1.1 10*3/uL (ref 0.7–4.0)
MCHC: 33.7 g/dL (ref 30.0–36.0)
MCV: 91.9 fl (ref 78.0–100.0)
Monocytes Absolute: 0.3 10*3/uL (ref 0.1–1.0)
Monocytes Relative: 8.7 % (ref 3.0–12.0)
Neutro Abs: 2.3 10*3/uL (ref 1.4–7.7)
Neutrophils Relative %: 58.4 % (ref 43.0–77.0)
Platelets: 216 10*3/uL (ref 150.0–400.0)
RBC: 4.47 Mil/uL (ref 4.22–5.81)
RDW: 12.8 % (ref 11.5–15.5)
WBC: 4 10*3/uL (ref 4.0–10.5)

## 2021-07-16 LAB — COMPREHENSIVE METABOLIC PANEL
ALT: 18 U/L (ref 0–53)
AST: 23 U/L (ref 0–37)
Albumin: 4.2 g/dL (ref 3.5–5.2)
Alkaline Phosphatase: 58 U/L (ref 39–117)
BUN: 15 mg/dL (ref 6–23)
CO2: 31 mEq/L (ref 19–32)
Calcium: 9.4 mg/dL (ref 8.4–10.5)
Chloride: 102 mEq/L (ref 96–112)
Creatinine, Ser: 1.03 mg/dL (ref 0.40–1.50)
GFR: 70.75 mL/min (ref >60.00)
Glucose, Bld: 102 mg/dL — ABNORMAL HIGH (ref 70–99)
Potassium: 4.1 mEq/L (ref 3.5–5.1)
Sodium: 139 mEq/L (ref 135–145)
Total Bilirubin: 0.7 mg/dL (ref 0.2–1.2)
Total Protein: 6.3 g/dL (ref 6.0–8.3)

## 2021-07-16 LAB — TSH: TSH: 1.79 u[IU]/mL (ref 0.35–5.50)

## 2021-07-16 LAB — LIPID PANEL
Cholesterol: 187 mg/dL (ref 0–200)
HDL: 87.8 mg/dL (ref >39.00)
LDL Cholesterol: 89 mg/dL (ref 0–99)
NonHDL: 99.08
Total CHOL/HDL Ratio: 2
Triglycerides: 52 mg/dL (ref 0.0–149.0)
VLDL: 10.4 mg/dL (ref 0.0–40.0)

## 2021-07-16 LAB — PSA, MEDICARE: PSA: 0.07 ng/ml — ABNORMAL LOW (ref 0.10–4.00)

## 2021-07-16 LAB — HEMOGLOBIN A1C: Hgb A1c MFr Bld: 5.9 % (ref 4.6–6.5)

## 2021-07-16 NOTE — Assessment & Plan Note (Signed)
Chronic Check a1c Low sugar / carb diet Stressed regular exercise  

## 2021-07-16 NOTE — Assessment & Plan Note (Signed)
Chronic Controlled, stable Continue valium 2 mg daily prn - does not take often and only takes 1 mg Continue remeron 7.5 mg nightly

## 2021-07-16 NOTE — Assessment & Plan Note (Signed)
H/o prostate ca Check psa

## 2021-07-25 DIAGNOSIS — L720 Epidermal cyst: Secondary | ICD-10-CM | POA: Diagnosis not present

## 2021-08-15 ENCOUNTER — Ambulatory Visit (INDEPENDENT_AMBULATORY_CARE_PROVIDER_SITE_OTHER): Payer: Medicare Other | Admitting: Ophthalmology

## 2021-08-15 ENCOUNTER — Other Ambulatory Visit: Payer: Self-pay

## 2021-08-15 ENCOUNTER — Encounter (INDEPENDENT_AMBULATORY_CARE_PROVIDER_SITE_OTHER): Payer: Self-pay | Admitting: Ophthalmology

## 2021-08-15 DIAGNOSIS — H35371 Puckering of macula, right eye: Secondary | ICD-10-CM

## 2021-08-15 NOTE — Progress Notes (Signed)
08/15/2021     CHIEF COMPLAINT Patient presents for  Chief Complaint  Patient presents with   Retina Evaluation      HISTORY OF PRESENT ILLNESS: Edward Mccall is a 76 y.o. male who presents to the clinic today for:   HPI     Retina Evaluation           Laterality: right eye   Onset: 6 months ago   Duration: 6 months   Associated Symptoms: Distortion.  Negative for Flashes and Floaters         Comments   NP- wrinkle OD after cataract surgery- Dr. Katy Apo  Pt states, "I had cataract surgery in my right eye and I feel like my vision is worse than it was before. I am a big bike rider and I have more trouble seeing street signs and reading golf scores on TV. I also have wavy distortion when I look at a door or something that I know is suppose to be straight up and down."        Last edited by Kendra Opitz, COA on 08/15/2021  9:42 AM.      Referring physician: Katy Apo, MD [place],  [place] [address]  HISTORICAL INFORMATION:   Selected notes from the MEDICAL RECORD NUMBER    Lab Results  Component Value Date   HGBA1C 5.9 07/16/2021     CURRENT MEDICATIONS: No current outpatient medications on file. (Ophthalmic Drugs)   No current facility-administered medications for this visit. (Ophthalmic Drugs)   Current Outpatient Medications (Other)  Medication Sig   Ascorbic Acid (VITAMIN C) 1000 MG tablet Take 1,000 mg by mouth daily.   ASHWAGANDHA PO Take by mouth.   Cholecalciferol (VITAMIN D3) 25 MCG TABS Take 25 mcg by mouth daily. 50 mg daily   Coenzyme Q10 (CO Q-10) 300 MG CAPS Take by mouth.   COLLAGEN PO Take by mouth. Patient takes 10 grams daily   CREATINE PO Take 5 g by mouth. 5 grams daily   diazepam (VALIUM) 2 MG tablet Take 1 tablet (2 mg total) by mouth daily as needed for anxiety.   Krill Oil 500 MG CAPS Take by mouth.   mirtazapine (REMERON) 7.5 MG tablet TAKE 1 TABLET BY MOUTH AT  BEDTIME   Zinc 25 MG TABS Take by  mouth. 3 x a week Takes Friday Saturday and Sunday   No current facility-administered medications for this visit. (Other)      REVIEW OF SYSTEMS:    ALLERGIES Allergies  Allergen Reactions   Lexapro [Escitalopram]    Prozac [Fluoxetine Hcl]     Ramped up anxiety    Zoloft [Sertraline Hcl] Other (See Comments)    "ramped anxiety up"  Same effect with Prozac & Lexapro    PAST MEDICAL HISTORY Past Medical History:  Diagnosis Date   Anxiety 01/02/2011   GERD 05/24/2008   GILBERT\'S SYNDROME 10/21/2008   Gynecomastia    Gynecomastia, male 07/22/2012   Heart murmur    History of colonic polyps 09/16/2014   MITRAL REGURGITATION 11/15/2008   Mild to moderate by echo 2010   Prostate cancer (HCC)    Dr Borden   Spinal stenosis 01/02/2011   Past Surgical History:  Procedure Laterality Date   COLONOSCOPY     20 10 per pt- normal per Mr. Shinsato    colonoscopy with polypectomy  1997   neg since; Dr Peters, [place]  11/2010  L4-5 discectomy & laminectomy, Dr Ellene Route    LUMBAR [facility] SURGERY  01/2011   L4-5 discectomy,Dr Eakly SURGERY  09/2011   L4-5 minimally invasive fusion , Dr Myrene Buddy, [place] EXTRACTION      FAMILY HISTORY Family History  Problem Relation Age of Onset   Anxiety disorder Mother    Diabetes Mother        not definite   Bladder Cancer Mother    Anxiety disorder Son    Anxiety disorder Brother    Atrial fibrillation Brother    Alcohol abuse Father    CAD Neg Hx    Stroke Neg Hx    Heart disease Neg Hx    Colon cancer Neg Hx    Colon polyps Neg Hx    Esophageal cancer Neg Hx    Rectal cancer Neg Hx    Stomach cancer Neg Hx     SOCIAL HISTORY Social History   Tobacco Use   Smoking status: Former    Packs/day: 1.00    Years: 10.00    Pack years: 10.00    Types: Cigarettes    Quit date: 08/12/1973    Years since quitting: 48.0   Smokeless  tobacco: Former    Types: Chew   Tobacco comments:    smoked 1960-1975, up to 1 ppd  Vaping Use   Vaping Use: Never used  Substance Use Topics   Alcohol use: Yes    Alcohol/week: 7.0 standard drinks    Types: 7 Standard drinks or equivalent per week    Comment:  < 1/day   Drug use: No         OPHTHALMIC EXAM:  Base Eye Exam     Visual Acuity (ETDRS)       Right Left   Dist [place] 20/80 -2 20/30 -2   Dist ph [place] 20/60          Tonometry (Tonopen, 9:47 AM)       Right Left   Pressure 11 12         Pupils       Pupils Dark Light Shape React APD   Right PERRL 5 4 Round Brisk None   Left PERRL 5 4 Round Brisk None         Visual Fields (Counting fingers)       Left Right    Full Full         Extraocular Movement       Right Left    Full, Ortho Full, Ortho         Neuro/Psych     Oriented x3: Yes         Dilation     Both eyes: 1.0% Mydriacyl, 2.5% Phenylephrine @ 9:47 AM           Slit Lamp and Fundus Exam     External Exam       Right Left   External Normal Normal         Slit Lamp Exam       Right Left   Lids/Lashes Normal Normal   Conjunctiva/Sclera White and quiet White and quiet   Cornea Clear Clear   Anterior Chamber Deep and quiet Deep and quiet   Iris Round and reactive Round and reactive   Lens Centered posterior chamber intraocular lens 3+ Nuclear sclerosis   Anterior Vitreous Normal Normal  Fundus Exam       Right Left   Posterior Vitreous Posterior vitreous detachment Normal   Disc Normal Normal   C/D Ratio 0.15 0.1   Macula Macular thickening, Epiretinal membrane, moderate to severe topographic distortion Normal   Vessels Normal Normal   Periphery Normal Normal            IMAGING AND PROCEDURES  Imaging and Procedures for 08/15/21  OCT, Retina - OU - Both Eyes       Right Eye Quality was good. Scan locations included subfoveal. Central Foveal Thickness: 482. Findings include  epiretinal membrane, abnormal foveal contour.   Left Eye Quality was good. Scan locations included subfoveal. Central Foveal Thickness: 287. Findings include normal foveal contour.   Notes Photoreceptor layer disturbance central fovea OD secondary to epiretinal membrane and its thickening.  OS is normal     Color Fundus Photography Optos - OU - Both Eyes       Right Eye Disc findings include normal observations. Macula : epiretinal membrane. Vessels : normal observations. Periphery : normal observations.   Left Eye Progression has no prior data. Disc findings include normal observations. Macula : normal observations. Vessels : normal observations. Periphery : mass.   Notes OD, with epiretinal membrane and significant distortion to the fovea in the outer photoreceptor segments             ASSESSMENT/PLAN:  Right epiretinal membrane The nature of macular pucker (epiretinal membrane ERM) was discussed with the patient as well as threshold criteria for vitrectomy surgery. I explained that in rare cases another surgery is needed to actually remove a second wrinkle should it regrow.  Most often, the epiretinal membrane and underlying wrinkled internal limiting membrane are removed with the first surgery, to accomplish the goals.   If the operative eye is Phakic (natural lens still present), cataract surgery is often recommended prior to Vitrectomy. This will enable the retina surgeon to have the best view during surgery and the patient to obtain optimal results in the future. Treatment options were discussed.  I have recommended at home monitoring the near vision task in a monocular (1 eye at a time), with or without near vision glasses, to look for changes or declines in reading.  OD, explained to the patient that there is no other therapy to attempt to recover visual acuity in the right eye, other than vitrectomy and membrane peel.  Explained the patient the surgery last 15 to 18  minutes or 20 minutes at the most typically.  There is no planned injection of the gas bubble in his case.  He would not be required to do postoperative positioning.  I explained the patient that 3 to 3 weeks of topical medications will be necessary in 10 days of limited activity postoperatively where no intentional heart rate elevation was undertaken with activity     ICD-10-CM   1. Right epiretinal membrane  H35.371 OCT, Retina - OU - Both Eyes    Color Fundus Photography Optos - OU - Both Eyes      1.  OD, option to proceed with vitrectomy membrane peel at any time per patient.  2.  I have explained the patient to use monocular testing at home so he can distinguish the quality of vision between the 2 eyes knowing that the right eye has a very good chance of recovering similar to what the left eye has.  3.  Ophthalmic Meds Ordered this visit:  No orders of the  defined types were placed in this encounter.      Return ,, [facility], for As needed, schedule vitrectomy membrane [phone], OD.  There are no Patient Instructions on file for this visit.   Explained the diagnoses, plan, and follow up with the patient and they expressed understanding.  Patient expressed understanding of the importance of proper follow up care.   Clent Demark Trinaty Bundrick M.D. Diseases & Surgery of the Retina and Vitreous [facility] [place] 08/15/21     Abbreviations: M myopia (nearsighted); A astigmatism; H hyperopia (farsighted); P presbyopia; Mrx spectacle prescription;  CTL contact lenses; OD right eye; OS left eye; OU both eyes  XT exotropia; ET esotropia; PEK punctate epithelial keratitis; PEE punctate epithelial erosions; DES dry eye syndrome; MGD meibomian gland dysfunction; ATs artificial tears; PFAT's preservative free artificial tears; [place] nuclear sclerotic cataract; PSC posterior subcapsular cataract; ERM epi-retinal membrane; PVD posterior vitreous detachment; RD retinal  detachment; DM diabetes mellitus; DR diabetic retinopathy; NPDR non-proliferative diabetic retinopathy; PDR proliferative diabetic retinopathy; CSME clinically significant macular edema; DME diabetic macular edema; dbh dot blot hemorrhages; CWS cotton wool spot; POAG primary open angle glaucoma; C/D cup-to-disc ratio; HVF humphrey visual field; GVF goldmann visual field; OCT optical coherence tomography; IOP intraocular pressure; BRVO Branch retinal vein occlusion; CRVO central retinal vein occlusion; CRAO central retinal artery occlusion; BRAO branch retinal artery occlusion; RT retinal tear; SB scleral buckle; PPV pars plana vitrectomy; VH Vitreous hemorrhage; PRP panretinal laser photocoagulation; IVK intravitreal kenalog; VMT vitreomacular traction; MH Macular hole;  NVD neovascularization of the disc; NVE neovascularization elsewhere; AREDS age related eye disease study; ARMD age related macular degeneration; POAG primary open angle glaucoma; EBMD epithelial/anterior basement membrane dystrophy; ACIOL anterior chamber intraocular lens; IOL intraocular lens; PCIOL posterior chamber intraocular lens; Phaco/IOL phacoemulsification with intraocular lens placement; [place] photorefractive keratectomy; LASIK laser assisted in situ keratomileusis; HTN hypertension; DM diabetes mellitus; COPD chronic obstructive pulmonary disease

## 2021-08-15 NOTE — Assessment & Plan Note (Signed)
The nature of macular pucker (epiretinal membrane ERM) was discussed with the patient as well as threshold criteria for vitrectomy surgery. I explained that in rare cases another surgery is needed to actually remove a second wrinkle should it regrow.  Most often, the epiretinal membrane and underlying wrinkled internal limiting membrane are removed with the first surgery, to accomplish the goals.   If the operative eye is Phakic (natural lens still present), cataract surgery is often recommended prior to Vitrectomy. This will enable the retina surgeon to have the best view during surgery and the patient to obtain optimal results in the future. Treatment options were discussed.  I have recommended at home monitoring the near vision task in a monocular (1 eye at a time), with or without near vision glasses, to look for changes or declines in reading.  OD, explained to the patient that there is no other therapy to attempt to recover visual acuity in the right eye, other than vitrectomy and membrane peel.  Explained the patient the surgery last 15 to 18 minutes or 20 minutes at the most typically.  There is no planned injection of the gas bubble in his case.  He would not be required to do postoperative positioning.  I explained the patient that 3 to 3 weeks of topical medications will be necessary in 10 days of limited activity postoperatively where no intentional heart rate elevation was undertaken with activity

## 2021-08-30 NOTE — Progress Notes (Signed)
c  Chief Complaint  Patient presents with   Follow-up    Aortic stenosis   History of Present Illness: 76 yo male with history of mitral regurgitation, anxiety and GERD who is here today for cardiac follow up. Echo January 2014 with mild MR. Stress myoview March 2016 with LVEF=59%, no ischemia. Echo 08/23/15 with normal LV size and function, mild MR. Most recent echo July 2022 with LVEF =60-65% with mild MR mild aortic stenosis (mean gradient 23 mmHg). He had Covid-19 in December 2020 and did well with no respiratory issues. He is an avid cyclist and rides his bike over 150 miles per week.   He is here today for follow up. The patient denies any chest pain, dyspnea, palpitations, lower extremity edema, orthopnea, PND, dizziness, near syncope or syncope. He is very active.   Primary Care Physician: Binnie Rail, MD  Past Medical History:  Diagnosis Date   Anxiety 01/02/2011   GERD 05/24/2008   GILBERT'S SYNDROME 10/21/2008   Gynecomastia    Gynecomastia, male 07/22/2012   Heart murmur    History of colonic polyps 09/16/2014   MITRAL REGURGITATION 11/15/2008   Mild to moderate by echo 2010   Prostate cancer [facility])    Dr Alinda Money   Spinal stenosis 01/02/2011    Past Surgical History:  Procedure Laterality Date   COLONOSCOPY     2010 per pt- normal per Mr. Pulsifer    colonoscopy with polypectomy  1997   neg since; Dr Peters, [place]  11/2010   L4-5 discectomy & laminectomy, Dr Ellene Route    LUMBAR [facility] SURGERY  01/2011   L4-5 discectomy,Dr [place]  09/2011   L4-5 minimally invasive fusion , Dr Myrene Buddy, [place] EXTRACTION      Current Outpatient Medications  Medication Sig Dispense Refill   Ascorbic Acid (VITAMIN C) 1000 MG tablet Take 1,000 mg by mouth daily.     ASHWAGANDHA PO Take by mouth.     Cholecalciferol (VITAMIN D3) 25 MCG TABS Take 25 mcg by mouth daily. 50 mg daily      Coenzyme Q10 (CO Q-10) 300 MG CAPS Take by mouth.     COLLAGEN PO Take by mouth. Patient takes 10 grams daily     CREATINE PO Take 5 g by mouth. 5 grams daily     diazepam (VALIUM) 2 MG tablet Take 1 tablet (2 mg total) by mouth daily as needed for anxiety. 90 tablet 0   Krill Oil 500 MG CAPS Take by mouth.     mirtazapine (REMERON) 7.5 MG tablet TAKE 1 TABLET BY MOUTH AT  BEDTIME 90 tablet 3   Zinc 25 MG TABS Take by mouth. 3 x a week Takes Friday Saturday and Sunday     No current facility-administered medications for this visit.    Allergies  Allergen Reactions   Lexapro [Escitalopram]    Prozac [Fluoxetine Hcl]     Ramped up anxiety    Zoloft [Sertraline Hcl] Other (See Comments)    "ramped anxiety up"  Same effect with Prozac & Lexapro    Social History   Socioeconomic History   Marital status: Married    Spouse name: Not on file   Number of children: 2   Years of education: Not on file   Highest education level: Not on file  Occupational History  Occupation: Retired-works part time at [employer]  Tobacco Use   Smoking status: Former    Packs/day: 1.00    Years: 10.00    Pack years: 10.00    Types: Cigarettes    Quit date: 08/12/1973    Years since quitting: 48.0   Smokeless tobacco: Former    Types: Chew   Tobacco comments:    smoked 1960-1975, up to 1 ppd  Vaping Use   Vaping Use: Never used  Substance and Sexual Activity   Alcohol use: Yes    Alcohol/week: 7.0 standard drinks    Types: 7 Standard drinks or equivalent per week    Comment:  < 1/day   Drug use: No   Sexual activity: Not on file  Other Topics Concern   Not on file  Social History Narrative   Not on file   Social Determinants of Health   Financial Resource Strain: Not on file  Food Insecurity: Not on file  Transportation Needs: Not on file  Physical Activity: Not on file  Stress: Not on file  Social Connections: Not on file  Intimate Partner  Violence: Not on file    Family History  Problem Relation Age of Onset   Anxiety disorder Mother    Diabetes Mother        not definite   Bladder Cancer Mother    Anxiety disorder Son    Anxiety disorder Brother    Atrial fibrillation Brother    Alcohol abuse Father    CAD Neg Hx    Stroke Neg Hx    Heart disease Neg Hx    Colon cancer Neg Hx    Colon polyps Neg Hx    Esophageal cancer Neg Hx    Rectal cancer Neg Hx    Stomach cancer Neg Hx     Review of Systems:  As stated in the HPI and otherwise negative.   BP (!) 142/80    Pulse (!) 52    Ht 5\' 9"  (1.753 m)    Wt 166 lb 9.6 oz (75.6 kg)    BMI 24.60 kg/m   Physical Examination: General: Well developed, well nourished, NAD  HEENT: OP clear, mucus membranes moist  SKIN: warm, dry. No rashes. Neuro: No focal deficits  Musculoskeletal: Muscle strength 5/5 all ext  Psychiatric: Mood and affect normal  Neck: No JVD, no carotid bruits, no thyromegaly, no lymphadenopathy.  Lungs:Clear bilaterally, no wheezes, rhonci, crackles Cardiovascular: Regular rate and rhythm. Systolic murmur.  Abdomen:Soft. Bowel sounds present. Non-tender.  Extremities: No lower extremity edema. Pulses are 2 + in the bilateral DP/PT.  Echo July 2022:  1. Left ventricular ejection fraction, by estimation, is 60 to 65%. The  left ventricle has normal function. The left ventricle has no regional  wall motion abnormalities. There is mild asymmetric left ventricular  hypertrophy of the basal and septal  segments. Left ventricular diastolic parameters were normal.   2. Right ventricular systolic function is normal. The right ventricular  size is normal.   3. Left atrial size was moderately dilated.   4. The mitral valve is normal in structure. Mild mitral valve  regurgitation. No evidence of mitral stenosis.   5. The aortic valve is tricuspid. There is moderate calcification of the  aortic valve. There is moderate thickening of the aortic valve.  Aortic  valve regurgitation is not visualized. Mild aortic valve stenosis.   6. The inferior vena cava is normal in  size with greater than 50%  respiratory variability, suggesting right atrial pressure of 3 mmHg.   EKG:  EKG is ordered today. The ekg ordered today demonstrates sinus, incomplete RBBB, Chronic TWI  Recent Labs: 07/16/2021: ALT 18; BUN 15; Creatinine, Ser 1.03; Hemoglobin 13.8; Platelets 216.0; Potassium 4.1; Sodium 139; TSH 1.79   Lipid Panel    Component Value Date/Time   CHOL 187 07/16/2021 0832   TRIG 52.0 07/16/2021 0832   HDL 87.80 07/16/2021 0832   CHOLHDL 2 07/16/2021 0832   VLDL 10.4 07/16/2021 0832   LDLCALC 89 07/16/2021 0832   LDLDIRECT 119.9 09/24/2013 1125     Wt Readings from Last 3 Encounters:  08/31/21 166 lb 9.6 oz (75.6 kg)  07/16/21 170 lb 3.2 oz (77.2 kg)  07/12/20 157 lb (71.2 kg)     Other studies Reviewed: Additional studies/ records that were reviewed today include: . Review of the above records demonstrates:    Assessment and Plan:   1. Mitral regurgitation: Mild by echo in July 2022.   2. PACs: No recent palpitations  3. Aortic stenosis: mild by echo July 2022. Repeat echo July 2023.   Current medicines are reviewed at length with the patient today.  The patient does not have concerns regarding medicines.  The following changes have been made:  no change  Labs/ tests ordered today include:   Orders Placed This Encounter  Procedures   EKG 12-Lead   ECHOCARDIOGRAM COMPLETE    Disposition:   FU with me in 12  months  Signed, Lauree Chandler, MD 08/31/2021 8:29 AM    [facility] [address], [place], [place]  [address] Phone: [phone]; Fax: [phone]     Lauree Chandler

## 2021-08-31 ENCOUNTER — Encounter: Payer: Self-pay | Admitting: Cardiovascular Disease

## 2021-08-31 ENCOUNTER — Ambulatory Visit: Payer: Medicare Other | Admitting: Cardiovascular Disease

## 2021-08-31 ENCOUNTER — Other Ambulatory Visit: Payer: Self-pay

## 2021-08-31 VITALS — BP 142/80 | HR 52 | Ht 69.0 in | Wt 166.6 lb

## 2021-08-31 DIAGNOSIS — I340 Nonrheumatic mitral (valve) insufficiency: Secondary | ICD-10-CM

## 2021-08-31 DIAGNOSIS — I350 Nonrheumatic aortic (valve) stenosis: Secondary | ICD-10-CM

## 2021-08-31 NOTE — Patient Instructions (Signed)
Medication Instructions:  No changes *If you need a refill on your cardiac medications before your next appointment, please call your pharmacy*   Lab Work: none If you have labs (blood work) drawn today and your tests are completely normal, you will receive your results only by: [place] (if you have MyChart) OR A paper copy in the mail If you have any lab test that is abnormal or we need to change your treatment, we will call you to review the results.   Testing/Procedures: ECHO IN July 2023 Your physician has requested that you have an echocardiogram. Echocardiography is a painless test that uses sound waves to create images of your heart. It provides your doctor with information about the size and shape of your heart and how well your hearts chambers and valves are working. This procedure takes approximately one hour. There are no restrictions for this procedure.   Follow-Up: At [facility], you and your health needs are our priority.  As part of our continuing mission to provide you with exceptional heart care, we have created designated Provider Care Teams.  These Care Teams include your primary Cardiologist (physician) and Advanced Practice Providers (APPs -  Physician Assistants and Nurse Practitioners) who all work together to provide you with the care you need, when you need it.   Your next appointment:   12 month(s)  The format for your next appointment:   In Person  Provider:   Lauree Chandler, MD     Other Instructions

## 2021-09-05 ENCOUNTER — Other Ambulatory Visit: Payer: Self-pay

## 2021-09-05 ENCOUNTER — Encounter (INDEPENDENT_AMBULATORY_CARE_PROVIDER_SITE_OTHER): Payer: Self-pay

## 2021-09-05 ENCOUNTER — Ambulatory Visit (INDEPENDENT_AMBULATORY_CARE_PROVIDER_SITE_OTHER): Payer: Medicare Other

## 2021-09-05 DIAGNOSIS — H35371 Puckering of macula, right eye: Secondary | ICD-10-CM

## 2021-09-05 MED ORDER — OFLOXACIN 0.3 % OP SOLN: 1.0000 [drp] | Freq: Four times a day (QID) | OPHTHALMIC | 0 refills | Status: AC

## 2021-09-05 MED ORDER — PREDNISOLONE ACETATE 1 % OP SUSP: 1.0000 [drp] | Freq: Four times a day (QID) | OPHTHALMIC | 0 refills | Status: AC

## 2021-09-05 NOTE — Progress Notes (Signed)
09/05/2021     CHIEF COMPLAINT Patient presents for Pre-op Exam   HISTORY OF PRESENT ILLNESS: Edward Mccall is a 76 y.o. male who presents to the clinic today for:   HPI   Pre OP OD sx 09/12/21. Patient states vision is stable and unchanged since last visit. Denies any new floaters or FOL.  Last edited by Laurin Coder on 09/05/2021  3:04 PM.        HISTORICAL INFORMATION:   Selected notes from the MEDICAL RECORD NUMBER    Lab Results  Component Value Date   HGBA1C 5.9 07/16/2021     CURRENT MEDICATIONS: Current Outpatient Medications (Ophthalmic Drugs)  Medication Sig   ofloxacin (OCUFLOX) 0.3 % ophthalmic solution Place 1 drop into the right eye in the morning, at noon, in the evening, and at bedtime for 21 doses.   prednisoLONE acetate (PRED FORTE) 1 % ophthalmic suspension Place 1 drop into the right eye 4 (four) times daily for 21 doses.   No current facility-administered medications for this visit. (Ophthalmic Drugs)   Current Outpatient Medications (Other)  Medication Sig   Ascorbic Acid (VITAMIN C) 1000 MG tablet Take 1,000 mg by mouth daily.   ASHWAGANDHA PO Take by mouth.   Cholecalciferol (VITAMIN D3) 25 MCG TABS Take 25 mcg by mouth daily. 50 mg daily   Coenzyme Q10 (CO Q-10) 300 MG CAPS Take by mouth.   COLLAGEN PO Take by mouth. Patient takes 10 grams daily   CREATINE PO Take 5 g by mouth. 5 grams daily   diazepam (VALIUM) 2 MG tablet Take 1 tablet (2 mg total) by mouth daily as needed for anxiety.   Krill Oil 500 MG CAPS Take by mouth.   mirtazapine (REMERON) 7.5 MG tablet TAKE 1 TABLET BY MOUTH AT  BEDTIME   Zinc 25 MG TABS Take by mouth. 3 x a week Takes Friday Saturday and Sunday   No current facility-administered medications for this visit. (Other)     ALLERGIES Allergies  Allergen Reactions   Lexapro [Escitalopram]    Prozac [Fluoxetine Hcl]     Ramped up anxiety    Zoloft [Sertraline Hcl] Other (See Comments)    "ramped anxiety  up"  Same effect with Prozac & Lexapro    PAST MEDICAL HISTORY Past Medical History:  Diagnosis Date   Anxiety 01/02/2011   GERD 05/24/2008   GILBERT\'S SYNDROME 10/21/2008   Gynecomastia    Gynecomastia, male 07/22/2012   Heart murmur    History of colonic polyps 09/16/2014   MITRAL REGURGITATION 11/15/2008   Mild to moderate by echo 2010   Prostate cancer (HCC)    Dr Borden   Spinal stenosis 01/02/2011   Past Surgical History:  Procedure Laterality Date   COLONOSCOPY     2010 per pt- normal per Mr. Rudge    colonoscopy with polypectomy  1997   neg since; Dr Peters, [facility]   LUMBAR DISC SURGERY  11/2010   L4-5 discectomy & laminectomy, Dr Elsner    LUMBAR DISC SURGERY  01/2011   L4-5 discectomy,Dr Elsner   LUMBAR DISC SURGERY  09/2011   L4-5 minimally invasive fusion , Dr Issacs, [facility]   POLYPECTOMY     19 97 polyps    PROSTATECTOMY  2011   TOOTH EXTRACTION      FAMILY HISTORY Family History  Problem Relation Age of Onset   Anxiety disorder Mother    Diabetes Mother        not  definite   Bladder Cancer Mother    Anxiety disorder Son    Anxiety disorder Brother    Atrial fibrillation Brother    Alcohol abuse Father    CAD Neg Hx    Stroke Neg Hx    Heart disease Neg Hx    Colon cancer Neg Hx    Colon polyps Neg Hx    Esophageal cancer Neg Hx    Rectal cancer Neg Hx    Stomach cancer Neg Hx     SOCIAL HISTORY Social History   Tobacco Use   Smoking status: Former    Packs/day: 1.00    Years: 10.00    Pack years: 10.00    Types: Cigarettes    Quit date: 08/12/1973    Years since quitting: 48.0   Smokeless tobacco: Former    Types: Chew   Tobacco comments:    smoked 1960-1975, up to 1 ppd  Vaping Use   Vaping Use: Never used  Substance Use Topics   Alcohol use: Yes    Alcohol/week: 7.0 standard drinks    Types: 7 Standard drinks or equivalent per week    Comment:  < 1/day   Drug use: No         OPHTHALMIC EXAM:  Base Eye Exam      Visual Acuity (ETDRS)       Right Left   Dist [place] 20/80 -2 20/30   Dist ph [place] 20/60 -2          Tonometry     Tonopen, 3:05 PM         Pupils       Pupils Dark Light Shape APD   Right PERRL 5 4 Round None   Left PERRL 5 4 Round None         Extraocular Movement       Right Left    Full Full         Neuro/Psych     Oriented x3: Yes   Mood/Affect: Normal         Dilation     Both eyes: No Dilation @ 3:04 PM            IMAGING AND PROCEDURES  Imaging and Procedures for @TODAY @           ASSESSMENT/PLAN:  No diagnosis found.  Ophthalmic Meds Ordered this visit:  Meds ordered this encounter  Medications   prednisoLONE acetate (PRED FORTE) 1 % ophthalmic suspension    Sig: Place 1 drop into the right eye 4 (four) times daily for 21 doses.    Dispense:  5 mL    Refill:  0   ofloxacin (OCUFLOX) 0.3 % ophthalmic solution    Sig: Place 1 drop into the right eye in the morning, at noon, in the evening, and at bedtime for 21 doses.    Dispense:  5 mL    Refill:  0        Pre-op completed. Operative consent obtained with pre-op eye drops reviewed with Jannifer Franklin and sent via [facility] as needed. Post op instructions reviewed with patient and per patient all questions answered.  Laurin Coder

## 2021-09-12 ENCOUNTER — Encounter (AMBULATORY_SURGERY_CENTER): Payer: Medicare Other | Admitting: Ophthalmology

## 2021-09-12 DIAGNOSIS — H35371 Puckering of macula, right eye: Secondary | ICD-10-CM

## 2021-09-13 ENCOUNTER — Ambulatory Visit (INDEPENDENT_AMBULATORY_CARE_PROVIDER_SITE_OTHER): Payer: Medicare Other | Admitting: Ophthalmology

## 2021-09-13 ENCOUNTER — Other Ambulatory Visit: Payer: Self-pay

## 2021-09-13 ENCOUNTER — Encounter (INDEPENDENT_AMBULATORY_CARE_PROVIDER_SITE_OTHER): Payer: Self-pay | Admitting: Ophthalmology

## 2021-09-13 DIAGNOSIS — H35371 Puckering of macula, right eye: Secondary | ICD-10-CM

## 2021-09-13 NOTE — Progress Notes (Signed)
09/13/2021     CHIEF COMPLAINT Patient presents for  Chief Complaint  Patient presents with   Post-op Follow-up      HISTORY OF PRESENT ILLNESS: Edward Mccall is a 76 y.o. male who presents to the clinic today for:   HPI     Post-op Follow-up           Laterality: right eye   Discomfort: Negative for pain, itching, foreign body sensation, tearing, discharge and floaters   Vision: is blurred at distance         Comments   1 day PO OD sx 09/12/21, vitrectomy, membrane peel. Pt states "I feel like my vision is as good as it was, I still see the curves and lines when I close my left eye." Pt has the prednisolone and ofloxacin eye drops and will start using them today QID OD.      Last edited by Laurin Coder on 09/13/2021  8:53 AM.      Referring physician: Binnie Rail, MD [place],  [place] [id number]  HISTORICAL INFORMATION:   Selected notes from the MEDICAL RECORD NUMBER    Lab Results  Component Value Date   HGBA1C 5.9 07/16/2021     CURRENT MEDICATIONS: No current outpatient medications on file. (Ophthalmic Drugs)   No current facility-administered medications for this visit. (Ophthalmic Drugs)   Current Outpatient Medications (Other)  Medication Sig   Ascorbic Acid (VITAMIN C) 1000 MG tablet Take 1,000 mg by mouth daily.   ASHWAGANDHA PO Take by mouth.   Cholecalciferol (VITAMIN D3) 25 MCG TABS Take 25 mcg by mouth daily. 50 mg daily   Coenzyme Q10 (CO Q-10) 300 MG CAPS Take by mouth.   COLLAGEN PO Take by mouth. Patient takes 10 grams daily   CREATINE PO Take 5 g by mouth. 5 grams daily   diazepam (VALIUM) 2 MG tablet Take 1 tablet (2 mg total) by mouth daily as needed for anxiety.   Krill Oil 500 MG CAPS Take by mouth.   mirtazapine (REMERON) 7.5 MG tablet TAKE 1 TABLET BY MOUTH AT  BEDTIME   Zinc 25 MG TABS Take by mouth. 3 x a week Takes Friday Saturday and Sunday   No current facility-administered medications for this  visit. (Other)      REVIEW OF SYSTEMS:    ALLERGIES Allergies  Allergen Reactions   Lexapro [Escitalopram]    Prozac [Fluoxetine Hcl]     Ramped up anxiety    Zoloft [Sertraline Hcl] Other (See Comments)    "ramped anxiety up"  Same effect with Prozac & Lexapro    PAST MEDICAL HISTORY Past Medical History:  Diagnosis Date   Anxiety 01/02/2011   GERD 05/24/2008   GILBERT\'S SYNDROME 10/21/2008   Gynecomastia    Gynecomastia, male 07/22/2012   Heart murmur    History of colonic polyps 09/16/2014   MITRAL REGURGITATION 11/15/2008   Mild to moderate by echo 2010   Prostate cancer (HCC)    Dr Borden   Spinal stenosis 01/02/2011   Past Surgical History:  Procedure Laterality Date   COLONOSCOPY     20 10 per pt- normal per Mr. Wedin    colonoscopy with polypectomy  1997   neg since; Dr Peters, [place]  11/2010   L4-5 discectomy & laminectomy, Dr Ellene Route    LUMBAR [facility] SURGERY  01/2011   L4-5 discectomy,Dr [place]  09/2011  L4-5 minimally invasive fusion , Dr Myrene Buddy, [place] EXTRACTION      FAMILY HISTORY Family History  Problem Relation Age of Onset   Anxiety disorder Mother    Diabetes Mother        not definite   Bladder Cancer Mother    Anxiety disorder Son    Anxiety disorder Brother    Atrial fibrillation Brother    Alcohol abuse Father    CAD Neg Hx    Stroke Neg Hx    Heart disease Neg Hx    Colon cancer Neg Hx    Colon polyps Neg Hx    Esophageal cancer Neg Hx    Rectal cancer Neg Hx    Stomach cancer Neg Hx     SOCIAL HISTORY Social History   Tobacco Use   Smoking status: Former    Packs/day: 1.00    Years: 10.00    Pack years: 10.00    Types: Cigarettes    Quit date: 08/12/1973    Years since quitting: 48.1   Smokeless tobacco: Former    Types: Chew   Tobacco comments:    smoked 1960-1975, up to 1 ppd  Vaping Use   Vaping Use:  Never used  Substance Use Topics   Alcohol use: Yes    Alcohol/week: 7.0 standard drinks    Types: 7 Standard drinks or equivalent per week    Comment:  < 1/day   Drug use: No         OPHTHALMIC EXAM:  Base Eye Exam     Visual Acuity (ETDRS)       Right Left   Dist [place] 20/100 -2 20/50 -1   Dist ph  20/70 -2 20/40         Tonometry (Tonopen, 8:54 AM)       Right Left   Pressure 12 13         Pupils       Dark Light Shape React APD   Right [employer] Dilated  Round Minimal None   Left 5 4 Round Brisk None         Extraocular Movement       Right Left    Full Full         Neuro/Psych     Oriented x3: Yes   Mood/Affect: Normal         Dilation     Right eye: 1.0% Mydriacyl, 2.5% Phenylephrine @ 8:56 AM           Slit Lamp and Fundus Exam     External Exam       Right Left   External Normal Normal         Slit Lamp Exam       Right Left   Lids/Lashes Normal Normal   Conjunctiva/Sclera White and quiet White and quiet   Cornea Clear Clear   Anterior Chamber Deep and quiet Deep and quiet   Iris Round and reactive Round and reactive   Lens Centered posterior chamber intraocular lens 3+ Nuclear sclerosis   Anterior Vitreous Normal Normal         Fundus Exam       Right Left   Posterior Vitreous Posterior vitreous detachment    Disc Normal    C/D Ratio 0.15    Macula Macular thickening, Epiretinal membrane, moderate to severe topographic distortion    Vessels  Normal    Periphery Normal             IMAGING AND PROCEDURES  Imaging and Procedures for 09/13/21           ASSESSMENT/PLAN:  Right epiretinal membrane Postop day #1 looks great, clear avitric, less topographic distortion to the macula.     ICD-10-CM   1. Right epiretinal membrane  H35.371       1.  Looks great OD postop vitrectomy membrane peel.  2.  Restrictions and limitations reviewed with the patient  3.  Ophthalmic Meds Ordered this  visit:  No orders of the defined types were placed in this encounter.      Return in about 1 week (around 09/20/2021) for POST OP, OD, dilate.  Patient Instructions  Ofloxacin  4 times daily to the operative eye  Prednisolone acetate 1 drop to the operative eye 4 times daily  Patient instructed not to refill the medications and use them for maximum of 3 weeks.  Patient instructed do not rub the eye.  Patient has the option to use the patch at night.   No lifting and bending for 1 week. No water IN the eye for 10 days. Do not rub the eye. Wear shield at night for 1-3 days.  Continue your topical medications for a total of 3 weeks.  Do not refill your postoperative medications unless instructed.  Refrain from exercise or intentional activity which increases our heart rate above resting levels.  Normal walking to complete normal activities of your day are appropriate.  Driving:  Legally, you only need one good eye, of 20/40 or better to drive.  However, the practice does not recommend driving during first weeks after surgery, IF you are uncomfortable with your visual functioning or capabilities.   If you have known sleep apnea, wear your CPAP as you normally should.    Explained the diagnoses, plan, and follow up with the patient and they expressed understanding.  Patient expressed understanding of the importance of proper follow up care.   Clent Demark Rjay Revolorio M.D. Diseases & Surgery of the Retina and Vitreous Retina & Diabetic [place] 09/13/21     Abbreviations: M myopia (nearsighted); A astigmatism; H hyperopia (farsighted); P presbyopia; Mrx spectacle prescription;  CTL contact lenses; OD right eye; OS left eye; OU both eyes  XT exotropia; ET esotropia; PEK punctate epithelial keratitis; PEE punctate epithelial erosions; DES dry eye syndrome; MGD meibomian gland dysfunction; ATs artificial tears; PFAT's preservative free artificial tears; Ackerman nuclear sclerotic cataract; PSC  posterior subcapsular cataract; ERM epi-retinal membrane; PVD posterior vitreous detachment; RD retinal detachment; DM diabetes mellitus; DR diabetic retinopathy; NPDR non-proliferative diabetic retinopathy; PDR proliferative diabetic retinopathy; CSME clinically significant macular edema; DME diabetic macular edema; dbh dot blot hemorrhages; CWS cotton wool spot; POAG primary open angle glaucoma; C/D cup-to-disc ratio; HVF humphrey visual field; GVF goldmann visual field; OCT optical coherence tomography; IOP intraocular pressure; BRVO Branch retinal vein occlusion; CRVO central retinal vein occlusion; CRAO central retinal artery occlusion; BRAO branch retinal artery occlusion; RT retinal tear; SB scleral buckle; PPV pars plana vitrectomy; VH Vitreous hemorrhage; PRP panretinal laser photocoagulation; IVK intravitreal kenalog; VMT vitreomacular traction; MH Macular hole;  NVD neovascularization of the disc; NVE neovascularization elsewhere; AREDS age related eye disease study; ARMD age related macular degeneration; POAG primary open angle glaucoma; EBMD epithelial/anterior basement membrane dystrophy; ACIOL anterior chamber intraocular lens; IOL intraocular lens; PCIOL posterior chamber intraocular lens; Phaco/IOL phacoemulsification with intraocular lens placement; PRK photorefractive  keratectomy; LASIK laser assisted in situ keratomileusis; HTN hypertension; DM diabetes mellitus; COPD chronic obstructive pulmonary disease

## 2021-09-13 NOTE — Assessment & Plan Note (Signed)
Postop day #1 looks great, clear avitric, less topographic distortion to the macula.

## 2021-09-13 NOTE — Patient Instructions (Signed)

## 2021-09-19 ENCOUNTER — Encounter (INDEPENDENT_AMBULATORY_CARE_PROVIDER_SITE_OTHER): Payer: Self-pay | Admitting: Ophthalmology

## 2021-09-19 ENCOUNTER — Other Ambulatory Visit: Payer: Self-pay

## 2021-09-19 ENCOUNTER — Ambulatory Visit (INDEPENDENT_AMBULATORY_CARE_PROVIDER_SITE_OTHER): Payer: Medicare Other | Admitting: Ophthalmology

## 2021-09-19 DIAGNOSIS — H35371 Puckering of macula, right eye: Secondary | ICD-10-CM

## 2021-09-19 NOTE — Assessment & Plan Note (Signed)
Postop 1 week vitrectomy membrane peel OD, looks great with early improvement in outer retinal photoreceptor layers.  Much less thickening.  Patient to continue on topical medications for the next 2 weeks.

## 2021-09-19 NOTE — Progress Notes (Signed)
09/19/2021     CHIEF COMPLAINT Patient presents for  Chief Complaint  Patient presents with   Post-op Follow-up    Vitrectomy membrane peel for ERM OD,  HISTORY OF PRESENT ILLNESS: Edward Mccall is a 76 y.o. male who presents to the clinic today for:   HPI     Post-op Follow-up           Laterality: right eye   Discomfort: Negative for pain, itching, foreign body sensation, tearing, discharge and floaters   Vision: is stable         Comments   1 week PO, OD, dilate. Pt states "no pain but I had an ocular migraine the other day, I haven't had one in years though but I used to have them. I feel like my vision is better sometimes, sometimes it changes. I still see a curve when I close my left eye, like on the door looks like it has a wave in it with my right eye. It seems like it comes and goes a little but before the surgery it was steady in one place. I miss being able to work out and ride my bike." Pt is using prescribed eye drops QID OD. Pt wore patch for 5 nights after surgery. Pt asks "how long does it take to start noticing a difference."      Last edited by Laurin Coder on 09/19/2021  9:37 AM.      Referring physician: Binnie Rail, MD [place],  [place] [address]  HISTORICAL INFORMATION:   Selected notes from the MEDICAL RECORD NUMBER    Lab Results  Component Value Date   HGBA1C 5.9 07/16/2021     CURRENT MEDICATIONS: No current outpatient medications on file. (Ophthalmic Drugs)   No current facility-administered medications for this visit. (Ophthalmic Drugs)   Current Outpatient Medications (Other)  Medication Sig   Ascorbic Acid (VITAMIN C) 1000 MG tablet Take 1,000 mg by mouth daily.   ASHWAGANDHA PO Take by mouth.   Cholecalciferol (VITAMIN D3) 25 MCG TABS Take 25 mcg by mouth daily. 50 mg daily   Coenzyme Q10 (CO Q-10) 300 MG CAPS Take by mouth.   COLLAGEN PO Take by mouth. Patient takes 10 grams daily   CREATINE PO  Take 5 g by mouth. 5 grams daily   diazepam (VALIUM) 2 MG tablet Take 1 tablet (2 mg total) by mouth daily as needed for anxiety.   Krill Oil 500 MG CAPS Take by mouth.   mirtazapine (REMERON) 7.5 MG tablet TAKE 1 TABLET BY MOUTH AT  BEDTIME   Zinc 25 MG TABS Take by mouth. 3 x a week Takes Friday Saturday and Sunday   No current facility-administered medications for this visit. (Other)      REVIEW OF SYSTEMS: ROS   Negative for: Constitutional, Gastrointestinal, Neurological, Skin, Genitourinary, Musculoskeletal, HENT, Endocrine, Cardiovascular, Eyes, Respiratory, Psychiatric, Allergic/Imm, Heme/Lymph Last edited by Hurman Horn, MD on 09/19/2021  9:47 AM.       ALLERGIES Allergies  Allergen Reactions   Lexapro [Escitalopram]    Prozac [Fluoxetine Hcl]     Ramped up anxiety    Zoloft [Sertraline Hcl] Other (See Comments)    "ramped anxiety up"  Same effect with Prozac & Lexapro    PAST MEDICAL HISTORY Past Medical History:  Diagnosis Date   Anxiety 01/02/2011   GERD 05/24/2008   GILBERT'S SYNDROME 10/21/2008   Gynecomastia    Gynecomastia, male 07/22/2012  Heart murmur    History of colonic polyps 09/16/2014   MITRAL REGURGITATION 11/15/2008   Mild to moderate by echo 2010   Prostate cancer [facility])    Dr Alinda Money   Spinal stenosis 01/02/2011   Past Surgical History:  Procedure Laterality Date   COLONOSCOPY     2010 per pt- normal per Mr. Holtmeyer    colonoscopy with polypectomy  1997   neg since; Dr Peters, [place]  11/2010   L4-5 discectomy & laminectomy, Dr Ellene Route    LUMBAR [facility] SURGERY  01/2011   L4-5 discectomy,Dr Tutuilla  09/2011   L4-5 minimally invasive fusion , Dr Myrene Buddy, [place] Family History  Problem Relation Age of Onset   Anxiety disorder Mother    Diabetes Mother        not definite   Bladder Cancer Mother     Anxiety disorder Son    Anxiety disorder Brother    Atrial fibrillation Brother    Alcohol abuse Father    CAD Neg Hx    Stroke Neg Hx    Heart disease Neg Hx    Colon cancer Neg Hx    Colon polyps Neg Hx    Esophageal cancer Neg Hx    Rectal cancer Neg Hx    Stomach cancer Neg Hx     SOCIAL HISTORY Social History   Tobacco Use   Smoking status: Former    Packs/day: 1.00    Years: 10.00    Pack years: 10.00    Types: Cigarettes    Quit date: 08/12/1973    Years since quitting: 48.1   Smokeless tobacco: Former    Types: Chew   Tobacco comments:    smoked 1960-1975, up to 1 ppd  Vaping Use   Vaping Use: Never used  Substance Use Topics   Alcohol use: Yes    Alcohol/week: 7.0 standard drinks    Types: 7 Standard drinks or equivalent per week    Comment:  < 1/day   Drug use: No         OPHTHALMIC EXAM:  Base Eye Exam     Visual Acuity (ETDRS)       Right Left   Dist [place] 20/80 -1 20/60 -2   Dist ph [place] 20/50 -2 20/30 -1         Tonometry (Tonopen, 9:42 AM)       Right Left   Pressure 16 17         Pupils       Dark Light React APD   Right 4 3  None   Left 3 3 Minimal None         Extraocular Movement       Right Left    Full Full         Neuro/Psych     Oriented x3: Yes   Mood/Affect: Normal         Dilation     Right eye: 1.0% Mydriacyl, 2.5% Phenylephrine @ 9:42 AM           Slit Lamp and Fundus Exam     External Exam       Right Left   External Normal Normal         Slit Lamp Exam  Right Left   Lids/Lashes Normal Normal   Conjunctiva/Sclera White and quiet White and quiet   Cornea Clear Clear   Anterior Chamber Deep and quiet Deep and quiet   Iris Round and reactive Round and reactive   Lens Centered posterior chamber intraocular lens 3+ Nuclear sclerosis   Anterior Vitreous Normal Normal         Fundus Exam       Right Left   Posterior Vitreous clear otherwise and avitric    Disc Normal     C/D Ratio 0.15    Macula Much less topographic distortion macula flat    Vessels Normal    Periphery Normal             IMAGING AND PROCEDURES  Imaging and Procedures for 09/19/21  OCT, Retina - OU - Both Eyes       Right Eye Quality was good. Scan locations included subfoveal. Central Foveal Thickness: 480. Findings include abnormal foveal contour.   Left Eye Quality was good. Scan locations included subfoveal. Central Foveal Thickness: 287. Findings include normal foveal contour.   Notes Photoreceptor layer disturbance central fovea OD secondary to epiretinal membrane and its thickening, now 1 week post vitrectomy membrane peel slowly improving.  These photographs and improvements were shared with the patient today at the screen   OS is normal             ASSESSMENT/PLAN:  Right epiretinal membrane Postop 1 week vitrectomy membrane peel OD, looks great with early improvement in outer retinal photoreceptor layers.  Much less thickening.  Patient to continue on topical medications for the next 2 weeks.     ICD-10-CM   1. Right epiretinal membrane  H35.371 OCT, Retina - OU - Both Eyes      1.  OD looks great, will continue on topical medications.  Patient is noticing fleeting examples of improved visual acuity reading close captions on TV.  2.  OCT reviewed, reviewing the importance of the duration of the photoreceptor layer and it is currently happening already at this time.  3.  Ophthalmic Meds Ordered this visit:  No orders of the defined types were placed in this encounter.      Return in about 7 weeks (around 11/07/2021) for dilate, OD, OCT.  Patient Instructions  Ofloxacin  4 times daily to the operative eye  Prednisolone acetate 1 drop to the operative eye 4 times daily  Patient instructed not to refill the medications and use them for maximum of 3 weeks.  Patient instructed do not rub the eye.  Patient has the option to use the patch at  night.   At the end of 2 weeks from now, patient may taper the each of the medications to 1 drop twice daily to the right eye prior to discontinuing permanently, in the meantime do not refill the medication should they be completed prior to this   Explained the diagnoses, plan, and follow up with the patient and they expressed understanding.  Patient expressed understanding of the importance of proper follow up care.   Clent Demark Shanessa Hodak M.D. Diseases & Surgery of the Retina and Vitreous Retina & Diabetic [place] 09/19/21     Abbreviations: M myopia (nearsighted); A astigmatism; H hyperopia (farsighted); P presbyopia; Mrx spectacle prescription;  CTL contact lenses; OD right eye; OS left eye; OU both eyes  XT exotropia; ET esotropia; PEK punctate epithelial keratitis; PEE punctate epithelial erosions; DES dry eye syndrome; MGD meibomian gland dysfunction; ATs  artificial tears; PFAT's preservative free artificial tears; [place] nuclear sclerotic cataract; PSC posterior subcapsular cataract; ERM epi-retinal membrane; PVD posterior vitreous detachment; RD retinal detachment; DM diabetes mellitus; DR diabetic retinopathy; NPDR non-proliferative diabetic retinopathy; PDR proliferative diabetic retinopathy; CSME clinically significant macular edema; DME diabetic macular edema; dbh dot blot hemorrhages; CWS cotton wool spot; POAG primary open angle glaucoma; C/D cup-to-disc ratio; HVF humphrey visual field; GVF goldmann visual field; OCT optical coherence tomography; IOP intraocular pressure; BRVO Branch retinal vein occlusion; CRVO central retinal vein occlusion; CRAO central retinal artery occlusion; BRAO branch retinal artery occlusion; RT retinal tear; SB scleral buckle; PPV pars plana vitrectomy; VH Vitreous hemorrhage; PRP panretinal laser photocoagulation; IVK intravitreal kenalog; VMT vitreomacular traction; MH Macular hole;  NVD neovascularization of the disc; NVE neovascularization elsewhere; AREDS age  related eye disease study; ARMD age related macular degeneration; POAG primary open angle glaucoma; EBMD epithelial/anterior basement membrane dystrophy; ACIOL anterior chamber intraocular lens; IOL intraocular lens; PCIOL posterior chamber intraocular lens; Phaco/IOL phacoemulsification with intraocular lens placement; [place] photorefractive keratectomy; LASIK laser assisted in situ keratomileusis; HTN hypertension; DM diabetes mellitus; COPD chronic obstructive pulmonary disease

## 2021-09-19 NOTE — Patient Instructions (Signed)
Ofloxacin  4 times daily to the operative eye  Prednisolone acetate 1 drop to the operative eye 4 times daily  Patient instructed not to refill the medications and use them for maximum of 3 weeks.  Patient instructed do not rub the eye.  Patient has the option to use the patch at night.   At the end of 2 weeks from now, patient may taper the each of the medications to 1 drop twice daily to the right eye prior to discontinuing permanently, in the meantime do not refill the medication should they be completed prior to this

## 2021-09-26 DIAGNOSIS — D1801 Hemangioma of skin and subcutaneous tissue: Secondary | ICD-10-CM | POA: Diagnosis not present

## 2021-09-26 DIAGNOSIS — L570 Actinic keratosis: Secondary | ICD-10-CM | POA: Diagnosis not present

## 2021-09-26 DIAGNOSIS — L814 Other melanin hyperpigmentation: Secondary | ICD-10-CM | POA: Diagnosis not present

## 2021-09-26 DIAGNOSIS — L821 Other seborrheic keratosis: Secondary | ICD-10-CM | POA: Diagnosis not present

## 2021-10-11 DIAGNOSIS — Z961 Presence of intraocular lens: Secondary | ICD-10-CM | POA: Diagnosis not present

## 2021-10-11 DIAGNOSIS — H2512 Age-related nuclear cataract, left eye: Secondary | ICD-10-CM | POA: Diagnosis not present

## 2021-11-07 ENCOUNTER — Ambulatory Visit (INDEPENDENT_AMBULATORY_CARE_PROVIDER_SITE_OTHER): Payer: Medicare Other | Admitting: Ophthalmology

## 2021-11-07 ENCOUNTER — Other Ambulatory Visit: Payer: Self-pay

## 2021-11-07 ENCOUNTER — Encounter (INDEPENDENT_AMBULATORY_CARE_PROVIDER_SITE_OTHER): Payer: Self-pay | Admitting: Ophthalmology

## 2021-11-07 DIAGNOSIS — H35371 Puckering of macula, right eye: Secondary | ICD-10-CM

## 2021-11-07 DIAGNOSIS — H2512 Age-related nuclear cataract, left eye: Secondary | ICD-10-CM | POA: Insufficient documentation

## 2021-11-07 NOTE — Patient Instructions (Signed)
Patient has completed all topical medications right eye ? ?Patient may return here at anytime for any new symptoms that develop or as needed, or at the direction of Dr. Prudencio Burly ?

## 2021-11-07 NOTE — Progress Notes (Signed)
? ? ?11/07/2021 ? ?  ? ?CHIEF COMPLAINT ?Patient presents for  ?Chief Complaint  ?Patient presents with  ? Epiretinal Membrane/preretinal Fibrosis/cellophane Maculopathy  ? ? ? ? ?HISTORY OF PRESENT ILLNESS: ?Edward Mccall is a 76 y.o. male who presents to the clinic today for:  ? ?HPI   ?7 weeks for dilate, 8 weeks postop vitrectomy membrane peel OD OD, OCT. ?Pt states some improvement in vision. ?Pt denies floaters and FOL. ?Pt will have cataract surgery in April 25th performed by Katy Apo.  ? ? ?Last edited by Hurman Horn, MD on 11/07/2021 11:30 AM.  ?  ? ? ?Referring physician: ?Katy Apo, MD ?[address] ?[place],  [place] [address] ? ?HISTORICAL INFORMATION:  ? ?Selected notes from the [facility] ?  ? ?Lab Results  ?Component Value Date  ? HGBA1C 5.9 07/16/2021  ?  ? ?CURRENT MEDICATIONS: ?No current outpatient medications on file. (Ophthalmic Drugs)  ? ?No current facility-administered medications for this visit. (Ophthalmic Drugs)  ? ?Current Outpatient Medications (Other)  ?Medication Sig  ? Ascorbic Acid (VITAMIN C) 1000 MG tablet Take 1,000 mg by mouth daily.  ? ASHWAGANDHA PO Take by mouth.  ? Cholecalciferol (VITAMIN D3) 25 MCG TABS Take 25 mcg by mouth daily. 50 mg daily  ? Coenzyme Q10 (CO Q-10) 300 MG CAPS Take by mouth.  ? COLLAGEN PO Take by mouth. Patient takes 10 grams daily  ? CREATINE PO Take 5 g by mouth. 5 grams daily  ? diazepam (VALIUM) 2 MG tablet Take 1 tablet (2 mg total) by mouth daily as needed for anxiety.  ? Krill Oil 500 MG CAPS Take by mouth.  ? mirtazapine (REMERON) 7.5 MG tablet TAKE 1 TABLET BY MOUTH AT  BEDTIME  ? Zinc 25 MG TABS Take by mouth. 3 x a week Takes Friday Saturday and Sunday  ? ?No current facility-administered medications for this visit. (Other)  ? ? ? ? ?REVIEW OF SYSTEMS: ?ROS   ?Negative for: Constitutional, Gastrointestinal, Neurological, Skin, Genitourinary, Musculoskeletal, HENT, Endocrine, Cardiovascular, Eyes, Respiratory, Psychiatric,  Allergic/Imm, Heme/Lymph ?Last edited by Silvestre Moment on 11/07/2021 10:22 AM.  ?  ? ? ? ?ALLERGIES ?Allergies  ?Allergen Reactions  ? Lexapro [Escitalopram]   ? Prozac [Fluoxetine Hcl]   ?  Ramped up anxiety   ? Zoloft [Sertraline Hcl] Other (See Comments)  ?  "ramped anxiety up"  ?Same effect with Prozac & Lexapro  ? ? ?PAST MEDICAL HISTORY ?Past Medical History:  ?Diagnosis Date  ? Anxiety 01/02/2011  ? GERD 05/24/2008  ? GILBERT'S SYNDROME 10/21/2008  ? Gynecomastia   ? Gynecomastia, male 07/22/2012  ? Heart murmur   ? History of colonic polyps 09/16/2014  ? MITRAL REGURGITATION 11/15/2008  ? Mild to moderate by echo 2010  ? Prostate cancer ([place])   ? Dr Alinda Money  ? Spinal stenosis 01/02/2011  ? ?Past Surgical History:  ?Procedure Laterality Date  ? COLONOSCOPY    ? 2010 per pt- normal per Mr. Sliwa   ? colonoscopy with polypectomy  1997  ? neg since; Dr Peters, [place]  ? [place] SURGERY  11/2010  ? L4-5 discectomy & laminectomy, Dr Ellene Route   ? [place] SURGERY  01/2011  ? L4-5 discectomy,Dr Elsner  ? [place] SURGERY  09/2011  ? L4-5 minimally invasive fusion , Dr Myrene Buddy, [facility]  ? POLYPECTOMY    ? 1997 polyps   ? PROSTATECTOMY  2011  ? TOOTH EXTRACTION    ? ? ?  FAMILY HISTORY ?Family History  ?Problem Relation Age of Onset  ? Anxiety disorder Mother   ? Diabetes Mother   ?     not definite  ? Bladder Cancer Mother   ? Anxiety disorder Son   ? Anxiety disorder Brother   ? Atrial fibrillation Brother   ? Alcohol abuse Father   ? CAD Neg Hx   ? Stroke Neg Hx   ? Heart disease Neg Hx   ? Colon cancer Neg Hx   ? Colon polyps Neg Hx   ? Esophageal cancer Neg Hx   ? Rectal cancer Neg Hx   ? Stomach cancer Neg Hx   ? ? ?SOCIAL HISTORY ?Social History  ? ?Tobacco Use  ? Smoking status: Former  ?  Packs/day: 1.00  ?  Years: 10.00  ?  Pack years: 10.00  ?  Types: Cigarettes  ?  Quit date: 08/12/1973  ?  Years since quitting: 48.2  ? Smokeless tobacco: Former  ?  Types: Chew  ? Tobacco comments:  ?  smoked 1960-1975, up to 1  ppd  ?Vaping Use  ? Vaping Use: Never used  ?Substance Use Topics  ? Alcohol use: Yes  ?  Alcohol/week: 7.0 standard drinks  ?  Types: 7 Standard drinks or equivalent per week  ?  Comment:  < 1/day  ? Drug use: No  ? ?  ? ?  ? ?OPHTHALMIC EXAM: ? ?Base Eye Exam   ? ? Visual Acuity (ETDRS)   ? ?   Right Left  ? Dist [place] 20/60 -1 20/50 -2  ? Dist ph [place] 20/30 +2 20/30 +2  ? ?  ?  ? ? Tonometry (Tonopen, 10:30 AM)   ? ?   Right Left  ? Pressure 14 13  ? ?  ?  ? ? Pupils   ? ?   Pupils Dark Light Shape React APD  ? Right PERRL 3 2 Round Brisk None  ? Left PERRL 3 2 Round Brisk None  ? ?  ?  ? ? Visual Fields   ? ?   Left Right  ?  Full Full  ? ?  ?  ? ? Extraocular Movement   ? ?   Right Left  ?  Full Full  ? ?  ?  ? ? Neuro/Psych   ? ? Oriented x3: Yes  ? Mood/Affect: Normal  ? ?  ?  ? ? Dilation   ? ? Right eye: 1.0% Mydriacyl, 2.5% Phenylephrine @ 10:30 AM  ? ?  ?  ? ?  ? ?Slit Lamp and Fundus Exam   ? ? External Exam   ? ?   Right Left  ? External Normal Normal  ? ?  ?  ? ? Slit Lamp Exam   ? ?   Right Left  ? Lids/Lashes Normal Normal  ? Conjunctiva/Sclera White and quiet White and quiet  ? Cornea Clear Clear  ? Anterior Chamber Deep and quiet Deep and quiet  ? Iris Round and reactive Round and reactive  ? Lens Centered posterior chamber intraocular lens 2+ Nuclear sclerosis  ? Anterior Vitreous Normal Normal  ? ?  ?  ? ? Fundus Exam   ? ?   Right Left  ? Posterior Vitreous clear otherwise and avitric   ? Disc Normal   ? C/D Ratio 0.15   ? Macula No topographic distortion macula flat   ? Vessels Normal   ? Periphery Normal   ? ?  ?  ? ?  ? ? ?  IMAGING AND PROCEDURES  ?Imaging and Procedures for 11/07/21 ? ?OCT, Retina - OU - Both Eyes   ? ?   ?Right Eye ?Quality was good. Scan locations included subfoveal. Central Foveal Thickness: 397. Findings include abnormal foveal contour.  ? ?Left Eye ?Quality was good. Scan locations included subfoveal. Central Foveal Thickness: 291. Findings include normal foveal contour.   ? ?Notes ?Photoreceptor layer disturbance central fovea OD secondary to epiretinal membrane and its thickening, now  8week post vitrectomy membrane peel slowly improving.  These photographs and improvements were shared with the patient today at the screen ? ? ?OS is normal ? ?  ? ? ?  ?  ? ?  ?ASSESSMENT/PLAN: ? ?Right epiretinal membrane ?Post vitrectomy membrane peel OD 09-12-2021, doing very well. ? ?Best visual acuity continues to improve slowly.  Pinhole acuity now 20/30 OD. ? ?Will need follow-up with Dr. Josephina Shih ? ?Nuclear sclerotic cataract of left eye ?Scheduled with Dr. Katy Apo for cataract surgery April 2023  ? ?  ICD-10-CM   ?1. Right epiretinal membrane  H35.371 OCT, Retina - OU - Both Eyes  ?  ?2. Nuclear sclerotic cataract of left eye  H25.12   ?  ? ? ?1.  OD looks great with improving acuity in the pseudophakic condition post vitrectomy and membrane peel for ERM ? ?2. ? ?3. ? ?Ophthalmic Meds Ordered this visit:  ?No orders of the defined types were placed in this encounter. ? ? ?  ? ?Return if symptoms worsen or fail to improve, for ,, And follow-up Dr. Katy Apo as scheduled. ? ?Patient Instructions  ?Patient has completed all topical medications right eye ? ?Patient may return here at anytime for any new symptoms that develop or as needed, or at the direction of Dr. Prudencio Burly ? ? ?Explained the diagnoses, plan, and follow up with the patient and they expressed understanding.  Patient expressed understanding of the importance of proper follow up care.  ? ?Clent Demark. Lalanya Rufener M.D. ?Diseases & Surgery of the Retina and Vitreous ?[facility] ?11/07/21 ? ? ? ? ?Abbreviations: ?M myopia (nearsighted); A astigmatism; H hyperopia (farsighted); P presbyopia; Mrx spectacle prescription;  CTL contact lenses; OD right eye; OS left eye; OU both eyes  XT exotropia; ET esotropia; PEK punctate epithelial keratitis; PEE punctate epithelial erosions; DES dry eye syndrome; MGD meibomian gland  dysfunction; ATs artificial tears; PFAT's preservative free artificial tears; [place] nuclear sclerotic cataract; PSC posterior subcapsular cataract; ERM epi-retinal membrane; PVD posterior vitreous detachment; RD

## 2021-11-07 NOTE — Assessment & Plan Note (Addendum)
Scheduled with Dr. Katy Apo for cataract surgery April 2023 ?

## 2021-11-07 NOTE — Assessment & Plan Note (Signed)
Post vitrectomy membrane peel OD 09-12-2021, doing very well. ? ?Best visual acuity continues to improve slowly.  Pinhole acuity now 20/30 OD. ? ?Will need follow-up with Dr. Josephina Shih ?

## 2021-12-04 DIAGNOSIS — H2512 Age-related nuclear cataract, left eye: Secondary | ICD-10-CM | POA: Diagnosis not present

## 2021-12-04 DIAGNOSIS — H52202 Unspecified astigmatism, left eye: Secondary | ICD-10-CM | POA: Diagnosis not present

## 2022-02-20 ENCOUNTER — Ambulatory Visit (HOSPITAL_COMMUNITY): Payer: Medicare Other | Attending: Internal Medicine

## 2022-02-20 DIAGNOSIS — I340 Nonrheumatic mitral (valve) insufficiency: Secondary | ICD-10-CM | POA: Insufficient documentation

## 2022-02-20 DIAGNOSIS — I350 Nonrheumatic aortic (valve) stenosis: Secondary | ICD-10-CM | POA: Insufficient documentation

## 2022-02-20 LAB — ECHOCARDIOGRAM COMPLETE
AR max vel: 2.01 cm2
AV Area VTI: 1.81 cm2
AV Area mean vel: 1.8 cm2
AV Mean grad: 15.5 mmHg
AV Peak grad: 25.4 mmHg
Ao pk vel: 2.52 m/s
Area-P 1/2: 3.6 cm2
MV M vel: 4.77 m/s
MV Peak grad: 91 mmHg
S' Lateral: 2.8 cm

## 2022-02-25 ENCOUNTER — Ambulatory Visit (INDEPENDENT_AMBULATORY_CARE_PROVIDER_SITE_OTHER): Payer: Medicare Other

## 2022-02-25 DIAGNOSIS — Z0000 Encounter for general adult medical examination without abnormal findings: Secondary | ICD-10-CM | POA: Diagnosis not present

## 2022-02-25 NOTE — Patient Instructions (Addendum)
[name] , Thank you for taking time to come for your Medicare Wellness Visit. I appreciate your ongoing commitment to your health goals. Please review the following plan we discussed and let me know if I can assist you in the future.   Screening recommendations/referrals: Colonoscopy: Discontinued due to age Recommended yearly ophthalmology/optometry visit for glaucoma screening and checkup Recommended yearly dental visit for hygiene and checkup  Vaccinations: Influenza vaccine: 05/08/2021 Pneumococcal vaccine: 04/04/2015, 12/20/2016 Tdap vaccine: 04/24/2021; due every 10 years Shingles vaccine: never done; recommend Shingrix which is 2 doses 2-6 months apart and over 90% effective    Covid-19: 10/27/2019, 11/24/2019, 07/14/2020, 05/08/2021  Advanced directives: Yes; Please bring a copy of your health care power of attorney and living will to the office at your convenience.  Conditions/risks identified: Yes; My goal is to maintain my health.  Next appointment: Please schedule your next Medicare Wellness Visit with your Nurse Health Advisor in 1 year by calling [phone].  Preventive Care 61 Years and Older, Male Preventive care refers to lifestyle choices and visits with your health care provider that can promote health and wellness. What does preventive care include? A yearly physical exam. This is also called an annual well check. Dental exams once or twice a year. Routine eye exams. Ask your health care provider how often you should have your eyes checked. Personal lifestyle choices, including: Daily care of your teeth and gums. Regular physical activity. Eating a healthy diet. Avoiding tobacco and drug use. Limiting alcohol use. Practicing safe sex. Taking low doses of aspirin every day. Taking vitamin and mineral supplements as recommended by your health care provider. What happens during an annual well check? The services and screenings done by your health care provider during  your annual well check will depend on your age, overall health, lifestyle risk factors, and family history of disease. Counseling  Your health care provider may ask you questions about your: Alcohol use. Tobacco use. Drug use. Emotional well-being. Home and relationship well-being. Sexual activity. Eating habits. History of falls. Memory and ability to understand (cognition). Work and work Statistician. Screening  You may have the following tests or measurements: Height, weight, and BMI. Blood pressure. Lipid and cholesterol levels. These may be checked every 5 years, or more frequently if you are over 49 years old. Skin check. Lung cancer screening. You may have this screening every year starting at age 9 if you have a 30-pack-year history of smoking and currently smoke or have quit within the past 15 years. Fecal occult blood test (FOBT) of the stool. You may have this test every year starting at age 62. Flexible sigmoidoscopy or colonoscopy. You may have a sigmoidoscopy every 5 years or a colonoscopy every 10 years starting at age 71. Prostate cancer screening. Recommendations will vary depending on your family history and other risks. Hepatitis C blood test. Hepatitis B blood test. Sexually transmitted disease (STD) testing. Diabetes screening. This is done by checking your blood sugar (glucose) after you have not eaten for a while (fasting). You may have this done every 1-3 years. Abdominal aortic aneurysm (AAA) screening. You may need this if you are a current or former smoker. Osteoporosis. You may be screened starting at age 32 if you are at high risk. Talk with your health care provider about your test results, treatment options, and if necessary, the need for more tests. Vaccines  Your health care provider may recommend certain vaccines, such as: Influenza vaccine. This is recommended every year. Tetanus,  diphtheria, and acellular pertussis (Tdap, Td) vaccine. You may need  a Td booster every 10 years. Zoster vaccine. You may need this after age 38. Pneumococcal 13-valent conjugate (PCV13) vaccine. One dose is recommended after age 83. Pneumococcal polysaccharide (PPSV23) vaccine. One dose is recommended after age 65. Talk to your health care provider about which screenings and vaccines you need and how often you need them. This information is not intended to replace advice given to you by your health care provider. Make sure you discuss any questions you have with your health care provider. Document Released: 08/25/2015 Document Revised: 04/17/2016 Document Reviewed: 05/30/2015 Elsevier Interactive Patient Education  2017 [place] Prevention in the Home Falls can cause injuries. They can happen to people of all ages. There are many things you can do to make your home safe and to help prevent falls. What can I do on the outside of my home? Regularly fix the edges of walkways and driveways and fix any cracks. Remove anything that might make you trip as you walk through a door, such as a raised step or threshold. Trim any bushes or trees on the path to your home. Use bright outdoor lighting. Clear any walking paths of anything that might make someone trip, such as rocks or tools. Regularly check to see if handrails are loose or broken. Make sure that both sides of any steps have handrails. Any raised decks and porches should have guardrails on the edges. Have any leaves, snow, or ice cleared regularly. Use sand or salt on walking paths during winter. Clean up any spills in your garage right away. This includes oil or grease spills. What can I do in the bathroom? Use night lights. Install grab bars by the toilet and in the tub and shower. Do not use towel bars as grab bars. Use non-skid mats or decals in the tub or shower. If you need to sit down in the shower, use a plastic, non-slip stool. Keep the floor dry. Clean up any water that spills on the  floor as soon as it happens. Remove soap buildup in the tub or shower regularly. Attach bath mats securely with double-sided non-slip rug tape. Do not have throw rugs and other things on the floor that can make you trip. What can I do in the bedroom? Use night lights. Make sure that you have a light by your bed that is easy to reach. Do not use any sheets or blankets that are too big for your bed. They should not hang down onto the floor. Have a firm chair that has side arms. You can use this for support while you get dressed. Do not have throw rugs and other things on the floor that can make you trip. What can I do in the kitchen? Clean up any spills right away. Avoid walking on wet floors. Keep items that you use a lot in easy-to-reach places. If you need to reach something above you, use a strong step stool that has a grab bar. Keep electrical cords out of the way. Do not use floor polish or wax that makes floors slippery. If you must use wax, use non-skid floor wax. Do not have throw rugs and other things on the floor that can make you trip. What can I do with my stairs? Do not leave any items on the stairs. Make sure that there are handrails on both sides of the stairs and use them. Fix handrails that are broken or loose. Make  sure that handrails are as long as the stairways. Check any carpeting to make sure that it is firmly attached to the stairs. Fix any carpet that is loose or worn. Avoid having throw rugs at the top or bottom of the stairs. If you do have throw rugs, attach them to the floor with carpet tape. Make sure that you have a light switch at the top of the stairs and the bottom of the stairs. If you do not have them, ask someone to add them for you. What else can I do to help prevent falls? Wear shoes that: Do not have high heels. Have rubber bottoms. Are comfortable and fit you well. Are closed at the toe. Do not wear sandals. If you use a stepladder: Make sure that  it is fully opened. Do not climb a closed stepladder. Make sure that both sides of the stepladder are locked into place. Ask someone to hold it for you, if possible. Clearly mark and make sure that you can see: Any grab bars or handrails. First and last steps. Where the edge of each step is. Use tools that help you move around (mobility aids) if they are needed. These include: Canes. Walkers. Scooters. Crutches. Turn on the lights when you go into a dark area. Replace any light bulbs as soon as they burn out. Set up your furniture so you have a clear path. Avoid moving your furniture around. If any of your floors are uneven, fix them. If there are any pets around you, be aware of where they are. Review your medicines with your doctor. Some medicines can make you feel dizzy. This can increase your chance of falling. Ask your doctor what other things that you can do to help prevent falls. This information is not intended to replace advice given to you by your health care provider. Make sure you discuss any questions you have with your health care provider. Document Released: 05/25/2009 Document Revised: 01/04/2016 Document Reviewed: 09/02/2014 Elsevier Interactive Patient Education  2017 [employer].

## 2022-02-25 NOTE — Progress Notes (Signed)
I connected with Edward Mccall today by telephone and verified that I am speaking with the correct person using two identifiers. Location patient: home Location provider: work Persons participating in the virtual visit: patient, provider.   I discussed the limitations, risks, security and privacy concerns of performing an evaluation and management service by telephone and the availability of in person appointments. I also discussed with the patient that there may be a patient responsible charge related to this service. The patient expressed understanding and verbally consented to this telephonic visit.    Interactive audio and video telecommunications were attempted between this provider and patient, however failed, due to patient having technical difficulties OR patient did not have access to video capability.  We continued and completed visit with audio only.  Some vital signs may be absent or patient reported.   Time Spent with patient on telephone encounter: 30 minutes  Subjective:   Edward Mccall is a 76 y.o. male who presents for Medicare Annual/Subsequent preventive examination.  Review of Systems     Cardiac Risk Factors include: advanced age (>59mn, >>27women);male gender     Objective:    There were no vitals filed for this visit. There is no height or weight on file to calculate BMI.     02/25/2022   11:39 AM  Advanced Directives  Does Patient Have a Medical Advance Directive? Yes  Type of Advance Directive Living will;Healthcare Power of Attorney  Does patient want to make changes to medical advance directive? No - Patient declined  Copy of HJeffersonin Chart? No - copy requested    Current Medications (verified) Outpatient Encounter Medications as of 02/25/2022  Medication Sig   Ascorbic Acid (VITAMIN C) 1000 MG tablet Take 1,000 mg by mouth daily.   ASHWAGANDHA PO Take by mouth.   Cholecalciferol (VITAMIN D3) 25 MCG TABS Take 25 mcg by  mouth daily. 50 mg daily   Coenzyme Q10 (CO Q-10) 300 MG CAPS Take by mouth.   COLLAGEN PO Take by mouth. Patient takes 10 grams daily   CREATINE PO Take 5 g by mouth. 5 grams daily   diazepam (VALIUM) 2 MG tablet Take 1 tablet (2 mg total) by mouth daily as needed for anxiety.   Krill Oil 500 MG CAPS Take by mouth.   mirtazapine (REMERON) 7.5 MG tablet TAKE 1 TABLET BY MOUTH AT  BEDTIME   Zinc 25 MG TABS Take by mouth. 3 x a week Takes Friday Saturday and "Sunday   No facility-administered encounter medications on file as of 02/25/2022.    Allergies (verified) Lexapro [escitalopram], Prozac [fluoxetine hcl], and Zoloft [sertraline hcl]   History: Past Medical History:  Diagnosis Date   Anxiety 01/02/2011   GERD 05/24/2008   GILBERT'S SYNDROME 10/21/2008   Gynecomastia    Gynecomastia, male 07/22/2012   Heart murmur    History of colonic polyps 09/16/2014   MITRAL REGURGITATION 11/15/2008   Mild to moderate by echo 2010   Prostate cancer (HCC)    Dr Borden   Spinal stenosis 01/02/2011   Past Surgical History:  Procedure Laterality Date   COLONOSCOPY     20"$ 10 per pt- normal per Mr. TStockman   colonoscopy with polypectomy  1997   neg [place]; [place]   L4-5 discectomy & laminectomy, Dr [facility]llene Route   [facility]   L4-5 discectomy,Dr EWooster  09/2011   L4-5 minimally invasive fusion , Dr Myrene Buddy, [place] EXTRACTION     Family History  Problem Relation Age of Onset   Anxiety disorder Mother    Diabetes Mother        not definite   Bladder Cancer Mother    Anxiety disorder Son    Anxiety disorder Brother    Atrial fibrillation Brother    Alcohol abuse Father    CAD Neg Hx    Stroke Neg Hx    Heart disease Neg Hx    Colon cancer Neg Hx    Colon polyps Neg Hx    Esophageal cancer Neg Hx    Rectal cancer Neg Hx    Stomach cancer Neg Hx     Social History   Socioeconomic History   Marital status: Married    Spouse name: Not on file   Number of children: 2   Years of education: Not on file   Highest education level: Not on file  Occupational History   Occupation: Retired-works part time at [employer]: [employer]  Tobacco Use   Smoking status: Former    Packs/day: 1.00    Years: 10.00    Total pack years: 10.00    Types: Cigarettes    Quit date: 08/12/1973    Years since quitting: 48.5   Smokeless tobacco: Former    Types: Chew   Tobacco comments:    smoked 1960-1975, up to 1 ppd  Vaping Use   Vaping Use: Never used  Substance and Sexual Activity   Alcohol use: Yes    Alcohol/week: 7.0 standard drinks of alcohol    Types: 7 Standard drinks or equivalent per week    Comment:  < 1/day   Drug use: No   Sexual activity: Not on file  Other Topics Concern   Not on file  Social History Narrative   Not on file   Social Determinants of Health   Financial Resource Strain: Low Risk  (02/25/2022)   Overall Financial Resource Strain (CARDIA)    Difficulty of Paying Living Expenses: Not hard at all  Food Insecurity: No Food Insecurity (02/25/2022)   Hunger Vital Sign    Worried About Running Out of Food in the Last Year: Never true    Ran Out of Food in the Last Year: Never true  Transportation Needs: No Transportation Needs (02/25/2022)   PRAPARE - Hydrologist (Medical): No    Lack of Transportation (Non-Medical): No  Physical Activity: Sufficiently Active (02/25/2022)   Exercise Vital Sign    Days of Exercise per Week: 7 days    Minutes of Exercise per Session: 30 min  Stress: No Stress Concern Present (02/25/2022)   [place]    Feeling of Stress : Not at all  Social Connections: [place] (02/25/2022)   Social Connection and Isolation Panel [NHANES]    Frequency of Communication with  Friends and Family: More than three times a week    Frequency of Social Gatherings with Friends and Family: More than three times a week    Attends Religious Services: 1 to 4 times per year    Active Member of [employer] or Organizations: Yes    Attends Archivist Meetings: 1 to 4 times per year    Marital  Status: Married    Tobacco Counseling Counseling given: Not Answered Tobacco comments: smoked 1960-1975, up to 1 ppd   Clinical Intake:  Pre-visit preparation completed: Yes  Pain : No/denies pain     Nutritional Risks: None Diabetes: No  How often do you need to have someone help you when you read instructions, pamphlets, or other written materials from your doctor or pharmacy?: 1 - Never What is the last grade level you completed in school?: HSG; couple of years of college  Diabetic? no  Interpreter Needed?: No  Information entered by :: Lisette Abu, LPN.   Activities of Daily Living    02/25/2022   11:49 AM 07/16/2021    8:00 AM  In your present state of health, do you have any difficulty performing the following activities:  Hearing? 0 0  Vision? 0 0  Difficulty concentrating or making decisions? 0 0  Walking or climbing stairs? 0 0  Dressing or bathing? 0 0  Doing errands, shopping? 0 0  Preparing Food and eating ? N   Using the Toilet? N   In the past six months, have you accidently leaked urine? N   Do you have problems with loss of bowel control? N   Managing your Medications? N   Managing your Finances? N   Housekeeping or managing your Housekeeping? N     Patient Care Team: Binnie Rail, MD as PCP - General (Internal Medicine) Burnell Blanks, MD as PCP - Cardiology (Cardiology) Zadie Rhine Clent Demark, MD as Consulting Physician (Ophthalmology) Katy Apo, MD as Consulting Physician (Ophthalmology)  Indicate any recent Medical Services you may have received from other than [facility] providers in the past year (date may be  approximate).     Assessment:   This is a routine wellness examination for [place].  Hearing/Vision screen Hearing Screening - Comments:: Patient denied any hearing difficulty.   No hearing aids.  Vision Screening - Comments:: Patient does wear readers.  Eye exam done by: Dr. Katy Apo and Dr. Deloria Lair   Dietary issues and exercise activities discussed: Current Exercise Habits: Home exercise routine, Type of exercise: walking, Time (Minutes): 30, Frequency (Times/Week): 7, Weekly Exercise (Minutes/Week): 210, Intensity: Moderate, Exercise limited by: None identified   Goals Addressed             This Visit's Progress    To maintain my health.        Depression Screen    02/25/2022   11:43 AM 07/16/2021    8:09 AM 07/13/2019    7:47 AM 07/06/2018    8:25 AM 06/30/2017    9:06 AM 12/05/2016    1:50 PM 04/04/2015    3:01 PM  PHQ 2/9 Scores  PHQ - 2 Score 0 0 0 0 0 0 1  PHQ- 9 Score  1         Fall Risk    02/25/2022   11:40 AM 07/16/2021    7:55 AM 07/12/2020    8:26 AM 07/12/2020    7:49 AM 07/13/2019    7:47 AM  Fall Risk   Falls in the past year? 0 0 1 0 1  Number falls in past yr: 0 0 0 0 0  Injury with Fall? 0 0 0 0   Risk for fall due to : No Fall Risks No Fall Risks  No Fall Risks   Follow up Falls evaluation completed Falls evaluation completed  Falls evaluation completed     [place]  TO THE HOME:  Any stairs in or around the home? Yes  If so, are there any without handrails? No  Home free of loose throw rugs in walkways, pet beds, electrical cords, etc? Yes  Adequate lighting in your home to reduce risk of falls? Yes   ASSISTIVE DEVICES UTILIZED TO PREVENT FALLS:  Life alert? No  Use of a cane, walker or w/c? No  Grab bars in the bathroom? No  Shower chair or bench in shower? No  Elevated toilet seat or a handicapped toilet? Yes   TIMED UP AND GO:  Was the test performed? No .  Length of time to ambulate 10 feet: na/  sec.   Appearance of gait: Gait not evaluated during this visit.  Cognitive Function:        02/25/2022   11:50 AM  6CIT Screen  What Year? 0 points  What month? 0 points  What time? 0 points  Count back from 20 0 points  Months in reverse 0 points  Repeat phrase 0 points  Total Score 0 points    Immunizations Immunization History  Administered Date(s) Administered   Fluad Quad(high Dose 65+) 05/06/2019, 06/09/2020   Influenza Split 06/30/2012   Influenza, High Dose Seasonal PF 05/14/2016, 05/14/2017, 05/07/2018, 05/08/2021   Influenza,inj,Quad PF,6+ Mos 05/26/2013, 05/26/2015   Influenza-Unspecified 05/06/2014   PFIZER(Purple Top)SARS-COV-2 Vaccination 10/27/2019, 11/24/2019, 07/14/2020   Pfizer Covid-19 Vaccine Bivalent Booster 65yr & up 05/08/2021   Pneumococcal Conjugate-13 04/04/2015   Pneumococcal Polysaccharide-23 12/20/2016   Td 11/11/1999, 06/20/2010   Tdap 04/24/2021   Zoster, Live 09/20/2013    TDAP status: Up to date  Flu Vaccine status: Up to date  Pneumococcal vaccine status: Up to date  Covid-19 vaccine status: Completed vaccines  Qualifies for Shingles Vaccine? Yes   Zostavax completed Yes   Shingrix Completed?: No.    Education has been provided regarding the importance of this vaccine. Patient has been advised to call insurance company to determine out of pocket expense if they have not yet received this vaccine. Advised may also receive vaccine at local pharmacy or Health Dept. Verbalized acceptance and understanding.  Screening Tests Health Maintenance  Topic Date Due   Zoster Vaccines- Shingrix (1 of 2) Never done   COVID-19 Vaccine (5 - Pfizer series) 09/07/2021   INFLUENZA VACCINE  03/12/2022   TETANUS/TDAP  04/25/2031   Pneumonia Vaccine 76 Years old  Completed   Hepatitis C Screening  Completed   HPV VACCINES  Aged Out   COLONOSCOPY (Pts 45-476yrInsurance coverage will need to be confirmed)  [place] Due  Topic Date Due   Zoster Vaccines- Shingrix (1 of 2) Never done   COVID-19 Vaccine (5 - Pfizer series) 09/07/2021    Colorectal cancer screening: No longer required.   Lung Cancer Screening: (Low Dose CT Chest recommended if Age 77-80ears, 30 pack-year currently smoking OR have quit w/in 15years.) does not qualify.   Lung Cancer Screening Referral: no  Additional Screening:  Hepatitis C Screening: does qualify; Completed 12/05/2016  Vision Screening: Recommended annual ophthalmology exams for early detection of glaucoma and other disorders of the eye. Is the patient up to date with their annual eye exam?  Yes  Who is the provider or what is the name of the office in which the patient attends annual eye exams? GrKaty ApoMD and GaDeloria LairMD If pt is not established with a provider, would they like to be  referred to a provider to establish care? No .   Dental Screening: Recommended annual dental exams for proper oral hygiene  Community Resource Referral / Chronic Care Management: CRR required this visit?  No   CCM required this visit?  No      Plan:     I have personally reviewed and noted the following in the patient's chart:   Medical and social history Use of alcohol, tobacco or illicit drugs  Current medications and supplements including opioid prescriptions. Patient is not currently taking opioid prescriptions. Functional ability and status Nutritional status Physical activity Advanced directives List of other physicians Hospitalizations, surgeries, and ER visits in previous 12 months Vitals Screenings to include cognitive, depression, and falls Referrals and appointments  In addition, I have reviewed and discussed with patient certain preventive protocols, quality metrics, and best practice recommendations. A written personalized care plan for preventive services as well as general preventive health recommendations were  provided to patient.     Sheral Flow, LPN   11/08/5186   Nurse Notes:  Patient is cogitatively intact. There were no vitals filed for this visit. There is no height or weight on file to calculate BMI. Patient stated that he has no issues with gait or balance; does not use any assistive devices. Medications reviewed with patient; no opioid use noted.

## 2022-05-30 ENCOUNTER — Encounter: Payer: Self-pay | Admitting: Internal Medicine

## 2022-05-30 NOTE — Progress Notes (Signed)
Subjective:    Patient ID: Edward Mccall, male    DOB: [birth year], 76 y.o.   MRN: [id number]      HPI Edward Mccall is here for  Chief Complaint  Patient presents with   Dizziness    Pt states that when they lay down at night they fill as if they about to pass out, but they function well throughout the day    Few weeks ago he had his first episode of vertigo.  He did lay down in bed at night and he had sudden onset of room spinning, and the numbers on the clock were spinning and the light on the TV was spinning.  It went away-lasted less than a minute.  Since then he has had 3-4 episodes.  Last night he had one in the night before he had 1.  It is always been when he lays in bed and his symptoms are transient.  He has not had any nausea, numbness, tingling or weakness in his arms or legs.  He does feel little unstable at this time.  He has not had any symptoms during the day.    Medications and allergies reviewed with patient and updated if appropriate.  Current Outpatient Medications on File Prior to Visit  Medication Sig Dispense Refill   Ascorbic Acid (VITAMIN C) 1000 MG tablet Take 1,000 mg by mouth daily.     Cholecalciferol (VITAMIN D3) 25 MCG TABS Take 25 mcg by mouth daily. 50 mg daily     Coenzyme Q10 (CO Q-10) 300 MG CAPS Take by mouth.     COLLAGEN PO Take by mouth. Patient takes 10 grams daily     CREATINE PO Take 5 g by mouth. 5 grams daily     diazepam (VALIUM) 2 MG tablet Take 1 tablet (2 mg total) by mouth daily as needed for anxiety. 90 tablet 0   Krill Oil 500 MG CAPS Take by mouth.     mirtazapine (REMERON) 7.5 MG tablet TAKE 1 TABLET BY MOUTH AT  BEDTIME 90 tablet 3   Zinc 25 MG TABS Take by mouth. 3 x a week Takes Friday Saturday and Sunday     ASHWAGANDHA PO Take by mouth. (Patient not taking: Reported on 05/31/2022)     No current facility-administered medications on file prior to visit.    Review of Systems  Constitutional:  Negative for fever.   HENT:  Negative for congestion, ear pain, sinus pain and sore throat.   Eyes:  Positive for visual disturbance (with dizziness).  Respiratory:  Negative for shortness of breath.   Cardiovascular:  Negative for chest pain and palpitations.  Gastrointestinal:  Negative for nausea.  Neurological:  Negative for weakness, numbness and headaches.       Objective:   Vitals:   05/31/22 1435  BP: 120/82  Pulse: (!) 51  Temp: 98.1 F (36.7 C)  SpO2: 94%   BP Readings from Last 3 Encounters:  05/31/22 120/82  08/31/21 (!) 142/80  07/16/21 130/82   Wt Readings from Last 3 Encounters:  05/31/22 162 lb 2 oz (73.5 kg)  08/31/21 166 lb 9.6 oz (75.6 kg)  07/16/21 170 lb 3.2 oz (77.2 kg)   Body mass index is 24.65 kg/m.    Physical Exam Constitutional:      General: He is not in acute distress.    Appearance: Normal appearance. He is not ill-appearing.  HENT:     Head: Normocephalic and atraumatic.     Right  Ear: Tympanic membrane, ear canal and external ear normal. There is no impacted cerumen.     Left Ear: Tympanic membrane, ear canal and external ear normal. There is no impacted cerumen.  Eyes:     Conjunctiva/sclera: Conjunctivae normal.  Cardiovascular:     Rate and Rhythm: Normal rate and regular rhythm.     Heart sounds: Normal heart sounds. No murmur heard. Pulmonary:     Effort: Pulmonary effort is normal. No respiratory distress.     Breath sounds: Normal breath sounds. No wheezing or rales.  Musculoskeletal:     Right lower leg: No edema.     Left lower leg: No edema.  Skin:    General: Skin is warm and dry.     Findings: No rash.  Neurological:     Mental Status: He is alert. Mental status is at baseline.     Cranial Nerves: No cranial nerve deficit.     Sensory: No sensory deficit.     Motor: No weakness.  Psychiatric:        Mood and Affect: Mood normal.            Assessment & Plan:    See Problem List for Assessment and Plan of chronic medical  problems.

## 2022-05-31 ENCOUNTER — Ambulatory Visit (INDEPENDENT_AMBULATORY_CARE_PROVIDER_SITE_OTHER): Payer: Medicare Other | Admitting: Internal Medicine

## 2022-05-31 VITALS — BP 120/82 | HR 51 | Temp 98.1°F | Ht 68.0 in | Wt 162.1 lb

## 2022-05-31 DIAGNOSIS — H8110 Benign paroxysmal vertigo, unspecified ear: Secondary | ICD-10-CM

## 2022-05-31 NOTE — Patient Instructions (Addendum)
     Medications changes include :   none    A referral was ordered for Dr Wilburn Cornelia.     Someone will call you to schedule an appointment.    Return if symptoms worsen or fail to improve.

## 2022-05-31 NOTE — Assessment & Plan Note (Signed)
Acute 2 episodes of vertigo-consistent with BPPV No neurological deficits or symptoms that are concerning for stroke No evidence of infection or cardiac cause Symptoms related to laying in bed and very transient No medication needed Will refer to ENT for possible Epley Salley Slaughter is friends with Dr. Wilburn Cornelia who said that he could see him Call if symptoms worsen or do not resolve

## 2022-06-24 ENCOUNTER — Other Ambulatory Visit: Payer: Self-pay | Admitting: Internal Medicine

## 2022-07-31 DIAGNOSIS — H938X3 Other specified disorders of ear, bilateral: Secondary | ICD-10-CM | POA: Diagnosis not present

## 2022-07-31 DIAGNOSIS — R42 Dizziness and giddiness: Secondary | ICD-10-CM | POA: Diagnosis not present

## 2022-09-26 ENCOUNTER — Encounter: Payer: Self-pay | Admitting: Internal Medicine

## 2022-09-26 NOTE — Progress Notes (Addendum)
Subjective:    Patient ID: Edward Mccall, male    DOB: 1945-11-12, 77 y.o.   MRN: [id number]     HPI Amiliano is here for a physical exam.   Once a month he has mild nose bleed when he wakes up in the morning.  Always the right nostril.     Medications and allergies reviewed with patient and updated if appropriate.  Current Outpatient Medications on File Prior to Visit  Medication Sig Dispense Refill   Coenzyme Q10 (CO Q-10) 300 MG CAPS Take by mouth.     COLLAGEN PO Take by mouth. Patient takes 10 grams daily     CREATINE PO Take 5 g by mouth. 5 grams daily     diazepam (VALIUM) 2 MG tablet Take 1 tablet (2 mg total) by mouth daily as needed for anxiety. 90 tablet 0   Magnesium 100 MG CAPS Take 100 mg by mouth daily.     mirtazapine (REMERON) 7.5 MG tablet TAKE 1 TABLET BY MOUTH AT  BEDTIME 90 tablet 3   Multiple Vitamin (MULTIVITAMIN ADULT PO) Take by mouth.     Omega-3 Fatty Acids (FISH OIL PO) Take by mouth.     No current facility-administered medications on file prior to visit.    Review of Systems  Constitutional:  Negative for fever.  Eyes:  Negative for visual disturbance.  Respiratory:  Negative for cough, shortness of breath and wheezing.   Cardiovascular:  Positive for palpitations (occ). Negative for chest pain and leg swelling.  Gastrointestinal:  Negative for abdominal pain, blood in stool, constipation and diarrhea.       No gerd  Genitourinary:  Positive for difficulty urinating (at night) and frequency (at night 2-3 times). Negative for dysuria and hematuria.  Musculoskeletal:  Negative for arthralgias and back pain.  Skin:  Negative for rash.  Neurological:  Positive for headaches (sometimes in morning - goes away quick). Negative for light-headedness.  Psychiatric/Behavioral:  Negative for dysphoric mood. The patient is not nervous/anxious.        Objective:   Vitals:   09/27/22 0748  BP: 130/72  Pulse: 60  Temp: 98 F (36.7 C)  SpO2: 97%    Filed Weights   09/27/22 0748  Weight: 165 lb (74.8 kg)   Body mass index is 24.37 kg/m.  BP Readings from Last 3 Encounters:  09/27/22 130/72  05/31/22 120/82  08/31/21 (!) 142/80    Wt Readings from Last 3 Encounters:  09/27/22 165 lb (74.8 kg)  05/31/22 162 lb 2 oz (73.5 kg)  08/31/21 166 lb 9.6 oz (75.6 kg)      Physical Exam Constitutional: He appears well-developed and well-nourished. No distress.  HENT:  Head: Normocephalic and atraumatic.  Right Ear: External ear normal.  Left Ear: External ear normal.  Mouth/Throat: Oropharynx is clear and moist.  Normal ear canals and TM b/l  Eyes: Conjunctivae and EOM are normal.  Neck: Neck supple. No tracheal deviation present. No thyromegaly present.  No carotid bruit  Cardiovascular: Normal rate, regular rhythm, normal heart sounds and intact distal pulses.   2/6 sys murmur heard. Pulmonary/Chest: Effort normal and breath sounds normal. No respiratory distress. He has no wheezes. He has no rales.  Abdominal: Soft. He exhibits no distension. There is no tenderness.  Genitourinary: deferred  Musculoskeletal: He exhibits no edema.  Lymphadenopathy:   He has no cervical adenopathy.  Skin: Skin is warm and dry. He is not diaphoretic.  Psychiatric: He has a  normal mood and affect. His behavior is normal.         Assessment & Plan:   Physical exam: Screening blood work  ordered Exercise   regular - weights, biking Weight  normal Substance abuse   none   Reviewed recommended immunizations.   Health Maintenance  Topic Date Due   Zoster Vaccines- Shingrix (1 of 2) 12/26/2022 (Originally 05/29/1995)   Medicare Annual Wellness (AWV)  02/26/2023   DTaP/Tdap/Td (4 - Td or Tdap) 04/25/2031   Pneumonia Vaccine 65+ Years old  Completed   INFLUENZA VACCINE  Completed   COVID-19 Vaccine  Completed   Hepatitis C Screening  Completed   HPV VACCINES  Aged Out   COLONOSCOPY (Pts 45-75yr Insurance coverage will need to  be confirmed)  Discontinued     See Problem List for Assessment and Plan of chronic medical problems.

## 2022-09-26 NOTE — Patient Instructions (Addendum)
Blood work was ordered.   The lab is on the first floor.    Medications changes include :  none     Return in about 1 year (around 09/28/2023) for Physical Exam.    Health Maintenance, Male Adopting a healthy lifestyle and getting preventive care are important in promoting health and wellness. Ask your health care provider about: The right schedule for you to have regular tests and exams. Things you can do on your own to prevent diseases and keep yourself healthy. What should I know about diet, weight, and exercise? Eat a healthy diet  Eat a diet that includes plenty of vegetables, fruits, low-fat dairy products, and lean protein. Do not eat a lot of foods that are high in solid fats, added sugars, or sodium. Maintain a healthy weight Body mass index (BMI) is a measurement that can be used to identify possible weight problems. It estimates body fat based on height and weight. Your health care provider can help determine your BMI and help you achieve or maintain a healthy weight. Get regular exercise Get regular exercise. This is one of the most important things you can do for your health. Most adults should: Exercise for at least 150 minutes each week. The exercise should increase your heart rate and make you sweat (moderate-intensity exercise). Do strengthening exercises at least twice a week. This is in addition to the moderate-intensity exercise. Spend less time sitting. Even light physical activity can be beneficial. Watch cholesterol and blood lipids Have your blood tested for lipids and cholesterol at 78 years of age, then have this test every 5 years. You may need to have your cholesterol levels checked more often if: Your lipid or cholesterol levels are high. You are older than 78 years of age. You are at high risk for heart disease. What should I know about cancer screening? Many types of cancers can be detected early and may often be prevented. Depending on your  health history and family history, you may need to have cancer screening at various ages. This may include screening for: Colorectal cancer. Prostate cancer. Skin cancer. Lung cancer. What should I know about heart disease, diabetes, and high blood pressure? Blood pressure and heart disease High blood pressure causes heart disease and increases the risk of stroke. This is more likely to develop in people who have high blood pressure readings or are overweight. Talk with your health care provider about your target blood pressure readings. Have your blood pressure checked: Every 3-5 years if you are 51-62 years of age. Every year if you are 49 years old or older. If you are between the ages of 58 and 74 and are a current or former smoker, ask your health care provider if you should have a one-time screening for abdominal aortic aneurysm (AAA). Diabetes Have regular diabetes screenings. This checks your fasting blood sugar level. Have the screening done: Once every three years after age 20 if you are at a normal weight and have a low risk for diabetes. More often and at a younger age if you are overweight or have a high risk for diabetes. What should I know about preventing infection? Hepatitis B If you have a higher risk for hepatitis B, you should be screened for this virus. Talk with your health care provider to find out if you are at risk for hepatitis B infection. Hepatitis C Blood testing is recommended for: Everyone born from [birth year] through 1966. Anyone with known risk  factors for hepatitis C. Sexually transmitted infections (STIs) You should be screened each year for STIs, including gonorrhea and chlamydia, if: You are sexually active and are younger than 78 years of age. You are older than 77 years of age and your health care provider tells you that you are at risk for this type of infection. Your sexual activity has changed since you were last screened, and you are at increased risk  for chlamydia or gonorrhea. Ask your health care provider if you are at risk. Ask your health care provider about whether you are at high risk for HIV. Your health care provider may recommend a prescription medicine to help prevent HIV infection. If you choose to take medicine to prevent HIV, you should first get tested for HIV. You should then be tested every 3 months for as long as you are taking the medicine. Follow these instructions at home: Alcohol use Do not drink alcohol if your health care provider tells you not to drink. If you drink alcohol: Limit how much you have to 0-2 drinks a day. Know how much alcohol is in your drink. In the [place] one drink equals one 12 oz bottle of beer (355 mL), one 5 oz glass of wine (148 mL), or one 1 oz glass of hard liquor (44 mL). Lifestyle Do not use any products that contain nicotine or tobacco. These products include cigarettes, chewing tobacco, and vaping devices, such as e-cigarettes. If you need help quitting, ask your health care provider. Do not use street drugs. Do not share needles. Ask your health care provider for help if you need support or information about quitting drugs. General instructions Schedule regular health, dental, and eye exams. Stay current with your vaccines. Tell your health care provider if: You often feel depressed. You have ever been abused or do not feel safe at home. Summary Adopting a healthy lifestyle and getting preventive care are important in promoting health and wellness. Follow your health care provider's instructions about healthy diet, exercising, and getting tested or screened for diseases. Follow your health care provider's instructions on monitoring your cholesterol and blood pressure. This information is not intended to replace advice given to you by your health care provider. Make sure you discuss any questions you have with your health care provider. Document Revised: 12/18/2020 Document Reviewed:  12/18/2020 Elsevier Patient Education  [place].

## 2022-09-27 ENCOUNTER — Ambulatory Visit (INDEPENDENT_AMBULATORY_CARE_PROVIDER_SITE_OTHER): Payer: Medicare Other | Admitting: Internal Medicine

## 2022-09-27 VITALS — BP 130/72 | HR 60 | Temp 98.0°F | Ht 69.0 in | Wt 165.0 lb

## 2022-09-27 DIAGNOSIS — Z0000 Encounter for general adult medical examination without abnormal findings: Secondary | ICD-10-CM | POA: Diagnosis not present

## 2022-09-27 DIAGNOSIS — E7849 Other hyperlipidemia: Secondary | ICD-10-CM | POA: Diagnosis not present

## 2022-09-27 DIAGNOSIS — F419 Anxiety disorder, unspecified: Secondary | ICD-10-CM | POA: Diagnosis not present

## 2022-09-27 DIAGNOSIS — R7303 Prediabetes: Secondary | ICD-10-CM | POA: Diagnosis not present

## 2022-09-27 DIAGNOSIS — Z8546 Personal history of malignant neoplasm of prostate: Secondary | ICD-10-CM

## 2022-09-27 LAB — CBC WITH DIFFERENTIAL/PLATELET
Basophils Absolute: 0 10*3/uL (ref 0.0–0.1)
Basophils Relative: 0.9 % (ref 0.0–3.0)
Eosinophils Absolute: 0.1 10*3/uL (ref 0.0–0.7)
Eosinophils Relative: 2.3 % (ref 0.0–5.0)
HCT: 44.6 % (ref 39.0–52.0)
Hemoglobin: 15.2 g/dL (ref 13.0–17.0)
Lymphocytes Relative: 24.9 % (ref 12.0–46.0)
Lymphs Abs: 1.1 10*3/uL (ref 0.7–4.0)
MCHC: 34.1 g/dL (ref 30.0–36.0)
MCV: 92.1 fl (ref 78.0–100.0)
Monocytes Absolute: 0.3 10*3/uL (ref 0.1–1.0)
Monocytes Relative: 7.6 % (ref 3.0–12.0)
Neutro Abs: 2.8 10*3/uL (ref 1.4–7.7)
Neutrophils Relative %: 64.3 % (ref 43.0–77.0)
Platelets: 220 10*3/uL (ref 150.0–400.0)
RBC: 4.84 Mil/uL (ref 4.22–5.81)
RDW: 13.2 % (ref 11.5–15.5)
WBC: 4.4 10*3/uL (ref 4.0–10.5)

## 2022-09-27 LAB — LIPID PANEL
Cholesterol: 222 mg/dL — ABNORMAL HIGH (ref 0–200)
HDL: 93.4 mg/dL (ref >39.00)
LDL Cholesterol: 117 mg/dL — ABNORMAL HIGH (ref 0–99)
NonHDL: 129.03
Total CHOL/HDL Ratio: 2
Triglycerides: 59 mg/dL (ref 0.0–149.0)
VLDL: 11.8 mg/dL (ref 0.0–40.0)

## 2022-09-27 LAB — COMPREHENSIVE METABOLIC PANEL
ALT: 23 U/L (ref 0–53)
AST: 28 U/L (ref 0–37)
Albumin: 4.5 g/dL (ref 3.5–5.2)
Alkaline Phosphatase: 65 U/L (ref 39–117)
BUN: 21 mg/dL (ref 6–23)
CO2: 33 mEq/L — ABNORMAL HIGH (ref 19–32)
Calcium: 10.1 mg/dL (ref 8.4–10.5)
Chloride: 100 mEq/L (ref 96–112)
Creatinine, Ser: 1.12 mg/dL (ref 0.40–1.50)
GFR: 63.44 mL/min (ref >60.00)
Glucose, Bld: 111 mg/dL — ABNORMAL HIGH (ref 70–99)
Potassium: 5.1 mEq/L (ref 3.5–5.1)
Sodium: 138 mEq/L (ref 135–145)
Total Bilirubin: 0.8 mg/dL (ref 0.2–1.2)
Total Protein: 7 g/dL (ref 6.0–8.3)

## 2022-09-27 LAB — HEMOGLOBIN A1C: Hgb A1c MFr Bld: 5.8 % (ref 4.6–6.5)

## 2022-09-27 LAB — TSH: TSH: 2.13 u[IU]/mL (ref 0.35–5.50)

## 2022-09-27 LAB — PSA, MEDICARE: PSA: 0.12 ng/ml (ref 0.10–4.00)

## 2022-09-27 NOTE — Assessment & Plan Note (Signed)
Chronic Regular exercise and healthy diet encouraged Check lipid panel  Continue lifestyle control 

## 2022-09-27 NOTE — Assessment & Plan Note (Signed)
Chronic Check a1c Low sugar / carb diet Stressed regular exercise  

## 2022-09-27 NOTE — Assessment & Plan Note (Signed)
H/o prostate cancer Check psa

## 2022-09-27 NOTE — Assessment & Plan Note (Addendum)
Chronic Controlled Continue Remeron 7.5 mg nightly Continue Valium 2 mg daily as needed, which he does not take often

## 2022-10-10 ENCOUNTER — Telehealth: Payer: Self-pay | Admitting: Cardiovascular Disease

## 2022-10-10 NOTE — Telephone Encounter (Signed)
Patient is requesting to speak with Dr. Camillia Herter nurse. He would like to know why he is unable to communicate with Dr. Angelena Form on [place].

## 2022-10-10 NOTE — Telephone Encounter (Signed)
Pt stated in his MyChart  Dr. Angelena Form was not listed as one of his providers. He was concerned that he was not able to schedule an appointment with the clinic. Pt is going to [place] for a month and wanted to schedule 1 year f/u prior to his trip. He is schedule on 5/22 @ 1130 with Dr. Gonzella Lex.

## 2022-10-11 NOTE — Telephone Encounter (Signed)
Noted.  Pt added to wait list since his 1 year follow up was due Jan 2024.

## 2022-10-27 NOTE — Progress Notes (Unsigned)
Chief Complaint  Patient presents with   Follow-up    Aortic stenosis    History of Present Illness: 77 yo male with history of mild aortic stenosis, anxiety and GERD who is here today for cardiac follow up. Echo January 2014 with mild MR. Stress myoview March 2016 with LVEF=59%, no ischemia. Most recent echo July 2023 with LVEF =60-65% with trivial MR, mild aortic stenosis (mean gradient 15 mmHg). He is an avid cyclist and rides his bike over 150 miles per week.   He is here today for follow up. The patient denies any chest pain, dyspnea, palpitations, lower extremity edema, orthopnea, PND, dizziness, near syncope or syncope.   Primary Care Physician: Binnie Rail, MD  Past Medical History:  Diagnosis Date   Anxiety 01/02/2011   GERD 05/24/2008   GILBERT'S SYNDROME 10/21/2008   Gynecomastia    Gynecomastia, male 07/22/2012   Heart murmur    History of colonic polyps 09/16/2014   MITRAL REGURGITATION 11/15/2008   Mild to moderate by echo 2010   Prostate cancer [facility])    Dr Alinda Money   Spinal stenosis 01/02/2011    Past Surgical History:  Procedure Laterality Date   COLONOSCOPY     2010 per pt- normal per Mr. Bercier    colonoscopy with polypectomy  1997   neg since; Dr Peters, [place]  11/2010   L4-5 discectomy & laminectomy, Dr Ellene Route    LUMBAR [facility] SURGERY  01/2011   L4-5 discectomy,Dr Zapata  09/2011   L4-5 minimally invasive fusion , Dr Myrene Buddy, [place] EXTRACTION      Current Outpatient Medications  Medication Sig Dispense Refill   Coenzyme Q10 (CO Q-10) 300 MG CAPS Take by mouth.     COLLAGEN PO Take by mouth. Patient takes 10 grams daily     CREATINE PO Take 5 g by mouth. 5 grams daily     diazepam (VALIUM) 2 MG tablet Take 1 tablet (2 mg total) by mouth daily as needed for anxiety. 90 tablet 0   Magnesium 100 MG CAPS Take 100 mg by mouth daily.      mirtazapine (REMERON) 7.5 MG tablet TAKE 1 TABLET BY MOUTH AT  BEDTIME 90 tablet 3   Multiple Vitamin (MULTIVITAMIN ADULT PO) Take by mouth.     Omega-3 Fatty Acids (FISH OIL PO) Take by mouth.     No current facility-administered medications for this visit.    Allergies  Allergen Reactions   Lexapro [Escitalopram]    Prozac [Fluoxetine Hcl]     Ramped up anxiety    Zoloft [Sertraline Hcl] Other (See Comments)    "ramped anxiety up"  Same effect with Prozac & Lexapro    Social History   Socioeconomic History   Marital status: Married    Spouse name: Not on file   Number of children: 2   Years of education: Not on file   Highest education level: Not on file  Occupational History   Occupation: Retired-works part time at [employer]  Tobacco Use   Smoking status: Former    Packs/day: 1.00    Years: 10.00    Additional pack years: 0.00    Total pack years: 10.00    Types: Cigarettes    Quit date: 08/12/1973    Years  since quitting: 49.2   Smokeless tobacco: Former    Types: Chew   Tobacco comments:    smoked 1960-1975, up to 1 ppd  Vaping Use   Vaping Use: Never used  Substance and Sexual Activity   Alcohol use: Yes    Alcohol/week: 7.0 standard drinks of alcohol    Types: 7 Standard drinks or equivalent per week    Comment:  < 1/day   Drug use: No   Sexual activity: Not on file  Other Topics Concern   Not on file  Social History Narrative   Not on file   Social Determinants of Health   Financial Resource Strain: Low Risk  (02/25/2022)   Overall Financial Resource Strain (CARDIA)    Difficulty of Paying Living Expenses: Not hard at all  Food Insecurity: No Food Insecurity (02/25/2022)   Hunger Vital Sign    Worried About Running Out of Food in the Last Year: Never true    Ran Out of Food in the Last Year: Never true  Transportation Needs: No Transportation Needs (02/25/2022)   PRAPARE - Hydrologist  (Medical): No    Lack of Transportation (Non-Medical): No  Physical Activity: Sufficiently Active (02/25/2022)   Exercise Vital Sign    Days of Exercise per Week: 7 days    Minutes of Exercise per Session: 30 min  Stress: No Stress Concern Present (02/25/2022)   [place]    Feeling of Stress : Not at all  Social Connections: [place]y (02/25/2022)   Social Connection and Isolation Panel [NHANES]    Frequency of Communication with Friends and Family: More than three times a week    Frequency of Social Gatherings with Friends and Family: More than three times a week    Attends Religious Services: 1 to 4 times per year    Active Member of [employer] or Organizations: Yes    Attends Archivist Meetings: 1 to 4 times per year    Marital Status: Married  Human resources officer Violence: Not At Risk (02/25/2022)   Humiliation, Afraid, Rape, and Kick questionnaire    Fear of Current or Ex-Partner: No    Emotionally Abused: No    Physically Abused: No    Sexually Abused: No    Family History  Problem Relation Age of Onset   Anxiety disorder Mother    Diabetes Mother        not definite   Bladder Cancer Mother    Anxiety disorder Son    Anxiety disorder Brother    Atrial fibrillation Brother    Alcohol abuse Father    CAD Neg Hx    Stroke Neg Hx    Heart disease Neg Hx    Colon cancer Neg Hx    Colon polyps Neg Hx    Esophageal cancer Neg Hx    Rectal cancer Neg Hx    Stomach cancer Neg Hx    Review of Systems:  As stated in the HPI and otherwise negative.   BP (!) 140/80   Pulse (!) 46   Ht 5\' 9"  (1.753 m)   Wt 74.8 kg   SpO2 99%   BMI 24.37 kg/m   Physical Examination: General: Well developed, well nourished, NAD  HEENT: OP clear, mucus membranes moist  SKIN: warm, dry. No rashes. Neuro: No focal deficits  Musculoskeletal: Muscle strength 5/5 all ext  Psychiatric: Mood and affect normal   Neck: No JVD,  no carotid bruits, no thyromegaly, no lymphadenopathy.  Lungs:Clear bilaterally, no wheezes, rhonci, crackles Cardiovascular: Regular rate and rhythm. Systolic murmur.  Abdomen:Soft. Bowel sounds present. Non-tender.  Extremities: No lower extremity edema. Pulses are 2 + in the bilateral DP/PT.  Echo July 2023: 1. Left ventricular ejection fraction, by estimation, is 60 to 65%. The  left ventricle has normal function. The left ventricle has no regional  wall motion abnormalities. There is mild asymmetric left ventricular  hypertrophy of the basal-septal segment.  Left ventricular diastolic parameters were normal. The average left  ventricular global longitudinal strain is -19.0 %. The global longitudinal  strain is normal.   2. Right ventricular systolic function is normal. The right ventricular  size is normal. There is normal pulmonary artery systolic pressure.   3. Left atrial size was moderately dilated.   4. Right atrial size was mildly dilated.   5. The mitral valve is degenerative. Trivial mitral valve regurgitation.   6. The tricuspid valve is degenerative.   7. AV mean gradient 15 mmhg. There is moderate calcification of the  aortic valve. Aortic valve regurgitation is mild. Mild aortic valve  stenosis.   8. Aortic no significant aortic root/ascending aortic aneurysm.   9. The inferior vena cava is normal in size with greater than 50%  respiratory variability, suggesting right atrial pressure of 3 mmHg.    EKG:  EKG is ordered today. The ekg ordered today demonstrates sinus brady, rate 46 bpm. Incomplete RBBB. Chronic T wave inversions-unchanged  Recent Labs: 09/27/2022: ALT 23; BUN 21; Creatinine, Ser 1.12; Hemoglobin 15.2; Platelets 220.0; Potassium 5.1; Sodium 138; TSH 2.13   Lipid Panel    Component Value Date/Time   CHOL 222 (H) 09/27/2022 0828   TRIG 59.0 09/27/2022 0828   HDL 93.40 09/27/2022 0828   CHOLHDL 2 09/27/2022 0828   VLDL 11.8  09/27/2022 0828   LDLCALC 117 (H) 09/27/2022 0828   LDLDIRECT 119.9 09/24/2013 1125    Wt Readings from Last 3 Encounters:  10/28/22 74.8 kg  09/27/22 74.8 kg  05/31/22 73.5 kg    Assessment and Plan:   1. Aortic stenosis: Mild by echo in July 2023. Repeat echo in July 2025.  2. PACs: No palpitations  3. Sinus bradycardia: no dizziness. Will follow  4. Incomplete RBBB: unchanged  Labs/ tests ordered today include:   Orders Placed This Encounter  Procedures   EKG 12-Lead   Disposition:   F/U with me in 12  months  Signed, Lauree Chandler, MD 10/28/2022 11:48 AM    [facility] [place], [place], [place]  [address] Phone: [phone]; Fax: [phone]

## 2022-10-28 ENCOUNTER — Ambulatory Visit: Payer: Medicare Other | Attending: Cardiovascular Disease | Admitting: Cardiovascular Disease

## 2022-10-28 ENCOUNTER — Encounter: Payer: Self-pay | Admitting: Cardiovascular Disease

## 2022-10-28 VITALS — BP 140/80 | HR 46 | Ht 69.0 in | Wt 165.0 lb

## 2022-10-28 DIAGNOSIS — I491 Atrial premature depolarization: Secondary | ICD-10-CM | POA: Diagnosis not present

## 2022-10-28 DIAGNOSIS — I350 Nonrheumatic aortic (valve) stenosis: Secondary | ICD-10-CM | POA: Diagnosis not present

## 2022-10-28 NOTE — Patient Instructions (Signed)
Medication Instructions:  No changes *If you need a refill on your cardiac medications before your next appointment, please call your pharmacy*   Lab Work: none If you have labs (blood work) drawn today and your tests are completely normal, you will receive your results only by: MyChart Message (if you have MyChart) OR A paper copy in the mail If you have any lab test that is abnormal or we need to change your treatment, we will call you to review the results.   Testing/Procedures: none   Follow-Up: At [facility], you and your health needs are our priority.  As part of our continuing mission to provide you with exceptional heart care, we have created designated Provider Care Teams.  These Care Teams include your primary Cardiologist (physician) and Advanced Practice Providers (APPs -  Physician Assistants and Nurse Practitioners) who all work together to provide you with the care you need, when you need it.     Your next appointment:   12 month(s)  Provider:   Christopher McAlhany, MD      

## 2022-10-30 DIAGNOSIS — L570 Actinic keratosis: Secondary | ICD-10-CM | POA: Diagnosis not present

## 2022-10-30 DIAGNOSIS — D1801 Hemangioma of skin and subcutaneous tissue: Secondary | ICD-10-CM | POA: Diagnosis not present

## 2022-10-30 DIAGNOSIS — L812 Freckles: Secondary | ICD-10-CM | POA: Diagnosis not present

## 2022-10-30 DIAGNOSIS — L821 Other seborrheic keratosis: Secondary | ICD-10-CM | POA: Diagnosis not present

## 2022-10-30 DIAGNOSIS — L853 Xerosis cutis: Secondary | ICD-10-CM | POA: Diagnosis not present

## 2022-10-30 DIAGNOSIS — L720 Epidermal cyst: Secondary | ICD-10-CM | POA: Diagnosis not present

## 2022-11-07 DIAGNOSIS — L720 Epidermal cyst: Secondary | ICD-10-CM | POA: Diagnosis not present

## 2022-12-11 ENCOUNTER — Telehealth: Payer: Self-pay | Admitting: Internal Medicine

## 2022-12-11 NOTE — Telephone Encounter (Signed)
Contacted Barrington Ellison to schedule their annual wellness visit. Patient declined to schedule AWV at this time.  [facility] Care Guide [facility] AWV TEAM Direct Dial: [phone]

## 2023-01-01 ENCOUNTER — Ambulatory Visit: Payer: Medicare Other | Admitting: Cardiovascular Disease

## 2023-02-06 DIAGNOSIS — Z961 Presence of intraocular lens: Secondary | ICD-10-CM | POA: Diagnosis not present

## 2023-02-06 DIAGNOSIS — H52203 Unspecified astigmatism, bilateral: Secondary | ICD-10-CM | POA: Diagnosis not present

## 2023-03-18 ENCOUNTER — Encounter: Payer: Self-pay | Admitting: Internal Medicine

## 2023-03-20 MED ORDER — MIRTAZAPINE 7.5 MG PO TABS: 7.5000 mg | ORAL_TABLET | Freq: Every day | ORAL | 3 refills | Status: DC

## 2023-09-02 ENCOUNTER — Other Ambulatory Visit: Payer: Self-pay | Admitting: Medical Genetics

## 2023-09-18 ENCOUNTER — Other Ambulatory Visit (HOSPITAL_COMMUNITY)
Admission: RE | Admit: 2023-09-18 | Discharge: 2023-09-18 | Disposition: A | Discharge status: Home / Self Care | Payer: Self-pay | Source: Ambulatory Visit | Attending: Medical Genetics | Admitting: Medical Genetics

## 2023-10-03 LAB — GENECONNECT MOLECULAR SCREEN: Genetic Analysis Overall Interpretation: NEGATIVE

## 2023-10-15 ENCOUNTER — Telehealth: Payer: Self-pay | Admitting: Internal Medicine

## 2023-10-15 NOTE — Assessment & Plan Note (Signed)
 tsh

## 2023-10-15 NOTE — Progress Notes (Unsigned)
 Subjective:    Patient ID: Edward Mccall, male    DOB: January 15, 1946, 78 y.o.   MRN: [id number]     HPI Edward Mccall is here for a physical exam and his chronic medical problems.   Bought a new scale - advised him he may has low bone density and high visceral fat.    No other concerns.    Medications and allergies reviewed with patient and updated if appropriate.  Current Outpatient Medications on File Prior to Visit  Medication Sig Dispense Refill   COLLAGEN PO Take by mouth. Patient takes 10 grams daily     CREATINE PO Take 5 g by mouth. 5 grams daily     Magnesium 100 MG CAPS Take 100 mg by mouth daily.     Multiple Vitamin (MULTIVITAMIN ADULT PO) Take by mouth.     No current facility-administered medications on file prior to visit.    Review of Systems  Constitutional:  Negative for fever.  Eyes:  Negative for visual disturbance.  Respiratory:  Negative for cough, shortness of breath and wheezing.   Cardiovascular:  Negative for chest pain, palpitations and leg swelling.  Gastrointestinal:  Negative for abdominal pain, blood in stool, constipation and diarrhea.       No gerd  Genitourinary:  Negative for difficulty urinating, dysuria and hematuria.  Musculoskeletal:  Positive for arthralgias (mild) and back pain (mild first thing in the morning).  Skin:  Negative for rash.  Neurological:  Positive for headaches (in morning). Negative for light-headedness.  Psychiatric/Behavioral:  Negative for dysphoric mood. The patient is not nervous/anxious.        Objective:   Vitals:   10/16/23 0805  BP: 114/60  Pulse: (!) 55  Temp: 98.3 F (36.8 C)  SpO2: 99%   Filed Weights   10/16/23 0805  Weight: 159 lb 6.4 oz (72.3 kg)   Body mass index is 23.54 kg/m.  BP Readings from Last 3 Encounters:  10/16/23 114/60  10/28/22 (!) 140/80  09/27/22 130/72    Wt Readings from Last 3 Encounters:  10/16/23 159 lb 6.4 oz (72.3 kg)  10/28/22 165 lb (74.8 kg)  09/27/22  165 lb (74.8 kg)      Physical Exam Constitutional: He appears well-developed and well-nourished. No distress.  HENT:  Head: Normocephalic and atraumatic.  Right Ear: External ear normal.  Left Ear: External ear normal.  Normal ear canals and TM b/l  Mouth/Throat: Oropharynx is clear and moist. Eyes: Conjunctivae and EOM are normal.  Neck: Neck supple. No tracheal deviation present. No thyromegaly present.  No carotid bruit  Cardiovascular: Normal rate, regular rhythm, normal heart sounds and intact distal pulses.   No murmur heard.  No lower extremity edema. Pulmonary/Chest: Effort normal and breath sounds normal. No respiratory distress. He has no wheezes. He has no rales.  Abdominal: Soft. He exhibits no distension. There is no tenderness.  Genitourinary: deferred  Lymphadenopathy:   He has no cervical adenopathy.  Skin: Skin is warm and dry. He is not diaphoretic.  Psychiatric: He has a normal mood and affect. His behavior is normal.         Assessment & Plan:   Physical exam: Screening blood work  ordered Exercise   regular - biking, weights 3/week Weight  normal Substance abuse   none   Reviewed recommended immunizations.   Health Maintenance  Topic Date Due   COVID-19 Vaccine (6 - 2024-25 season) 11/01/2023 (Originally 04/13/2023)   INFLUENZA VACCINE  11/10/2023 (  Originally 03/13/2023)   Medicare Annual Wellness (AWV)  11/16/2023 (Originally 02/26/2023)   Zoster Vaccines- Shingrix (1 of 2) 01/16/2024 (Originally 05/29/1995)   DTaP/Tdap/Td (4 - Td or Tdap) 04/25/2031   Pneumonia Vaccine 30+ Years old  Completed   Hepatitis C Screening  Completed   HPV VACCINES  Aged Out   Colonoscopy  Discontinued     See Problem List for Assessment and Plan of chronic medical problems.

## 2023-10-15 NOTE — Patient Instructions (Addendum)
 Blood work was ordered.       Medications changes include :   None     Return in about 1 year (around 10/15/2024) for Physical Exam.    Health Maintenance, Male Adopting a healthy lifestyle and getting preventive care are important in promoting health and wellness. Ask your health care provider about: The right schedule for you to have regular tests and exams. Things you can do on your own to prevent diseases and keep yourself healthy. What should I know about diet, weight, and exercise? Eat a healthy diet  Eat a diet that includes plenty of vegetables, fruits, low-fat dairy products, and lean protein. Do not eat a lot of foods that are high in solid fats, added sugars, or sodium. Maintain a healthy weight Body mass index (BMI) is a measurement that can be used to identify possible weight problems. It estimates body fat based on height and weight. Your health care provider can help determine your BMI and help you achieve or maintain a healthy weight. Get regular exercise Get regular exercise. This is one of the most important things you can do for your health. Most adults should: Exercise for at least 150 minutes each week. The exercise should increase your heart rate and make you sweat (moderate-intensity exercise). Do strengthening exercises at least twice a week. This is in addition to the moderate-intensity exercise. Spend less time sitting. Even light physical activity can be beneficial. Watch cholesterol and blood lipids Have your blood tested for lipids and cholesterol at 79 years of age, then have this test every 5 years. You may need to have your cholesterol levels checked more often if: Your lipid or cholesterol levels are high. You are older than 79 years of age. You are at high risk for heart disease. What should I know about cancer screening? Many types of cancers can be detected early and may often be prevented. Depending on your health history and family  history, you may need to have cancer screening at various ages. This may include screening for: Colorectal cancer. Prostate cancer. Skin cancer. Lung cancer. What should I know about heart disease, diabetes, and high blood pressure? Blood pressure and heart disease High blood pressure causes heart disease and increases the risk of stroke. This is more likely to develop in people who have high blood pressure readings or are overweight. Talk with your health care provider about your target blood pressure readings. Have your blood pressure checked: Every 3-5 years if you are 70-77 years of age. Every year if you are 31 years old or older. If you are between the ages of 72 and 54 and are a current or former smoker, ask your health care provider if you should have a one-time screening for abdominal aortic aneurysm (AAA). Diabetes Have regular diabetes screenings. This checks your fasting blood sugar level. Have the screening done: Once every three years after age 76 if you are at a normal weight and have a low risk for diabetes. More often and at a younger age if you are overweight or have a high risk for diabetes. What should I know about preventing infection? Hepatitis B If you have a higher risk for hepatitis B, you should be screened for this virus. Talk with your health care provider to find out if you are at risk for hepatitis B infection. Hepatitis C Blood testing is recommended for: Everyone born from [birth year] through 1966. Anyone with known risk factors for hepatitis C. Sexually  transmitted infections (STIs) You should be screened each year for STIs, including gonorrhea and chlamydia, if: You are sexually active and are younger than 79 years of age. You are older than 79 years of age and your health care provider tells you that you are at risk for this type of infection. Your sexual activity has changed since you were last screened, and you are at increased risk for chlamydia or  gonorrhea. Ask your health care provider if you are at risk. Ask your health care provider about whether you are at high risk for HIV. Your health care provider may recommend a prescription medicine to help prevent HIV infection. If you choose to take medicine to prevent HIV, you should first get tested for HIV. You should then be tested every 3 months for as long as you are taking the medicine. Follow these instructions at home: Alcohol use Do not drink alcohol if your health care provider tells you not to drink. If you drink alcohol: Limit how much you have to 0-2 drinks a day. Know how much alcohol is in your drink. In the [place] one drink equals one 12 oz bottle of beer (355 mL), one 5 oz glass of wine (148 mL), or one 1 oz glass of hard liquor (44 mL). Lifestyle Do not use any products that contain nicotine or tobacco. These products include cigarettes, chewing tobacco, and vaping devices, such as e-cigarettes. If you need help quitting, ask your health care provider. Do not use street drugs. Do not share needles. Ask your health care provider for help if you need support or information about quitting drugs. General instructions Schedule regular health, dental, and eye exams. Stay current with your vaccines. Tell your health care provider if: You often feel depressed. You have ever been abused or do not feel safe at home. Summary Adopting a healthy lifestyle and getting preventive care are important in promoting health and wellness. Follow your health care provider's instructions about healthy diet, exercising, and getting tested or screened for diseases. Follow your health care provider's instructions on monitoring your cholesterol and blood pressure. This information is not intended to replace advice given to you by your health care provider. Make sure you discuss any questions you have with your health care provider. Document Revised: 12/18/2020 Document Reviewed: 12/18/2020 Elsevier  Patient Education  2024 [employer].

## 2023-10-16 ENCOUNTER — Encounter: Payer: Self-pay | Admitting: Internal Medicine

## 2023-10-16 ENCOUNTER — Ambulatory Visit (INDEPENDENT_AMBULATORY_CARE_PROVIDER_SITE_OTHER): Payer: Medicare Other | Admitting: Internal Medicine

## 2023-10-16 VITALS — BP 114/60 | HR 55 | Temp 98.3°F | Ht 69.0 in | Wt 159.4 lb

## 2023-10-16 DIAGNOSIS — R7303 Prediabetes: Secondary | ICD-10-CM

## 2023-10-16 DIAGNOSIS — F419 Anxiety disorder, unspecified: Secondary | ICD-10-CM

## 2023-10-16 DIAGNOSIS — E7849 Other hyperlipidemia: Secondary | ICD-10-CM

## 2023-10-16 DIAGNOSIS — Z8546 Personal history of malignant neoplasm of prostate: Secondary | ICD-10-CM | POA: Diagnosis not present

## 2023-10-16 DIAGNOSIS — Z125 Encounter for screening for malignant neoplasm of prostate: Secondary | ICD-10-CM

## 2023-10-16 DIAGNOSIS — F411 Generalized anxiety disorder: Secondary | ICD-10-CM

## 2023-10-16 DIAGNOSIS — Z13820 Encounter for screening for osteoporosis: Secondary | ICD-10-CM

## 2023-10-16 DIAGNOSIS — I359 Nonrheumatic aortic valve disorder, unspecified: Secondary | ICD-10-CM | POA: Insufficient documentation

## 2023-10-16 DIAGNOSIS — Z0000 Encounter for general adult medical examination without abnormal findings: Secondary | ICD-10-CM

## 2023-10-16 LAB — PSA, MEDICARE: PSA: 0.25 ng/mL (ref 0.10–4.00)

## 2023-10-16 LAB — COMPREHENSIVE METABOLIC PANEL
ALT: 18 U/L (ref 0–53)
AST: 23 U/L (ref 0–37)
Albumin: 4.4 g/dL (ref 3.5–5.2)
Alkaline Phosphatase: 66 U/L (ref 39–117)
BUN: 22 mg/dL (ref 6–23)
CO2: 31 meq/L (ref 19–32)
Calcium: 9.7 mg/dL (ref 8.4–10.5)
Chloride: 100 meq/L (ref 96–112)
Creatinine, Ser: 1.01 mg/dL (ref 0.40–1.50)
GFR: 71.3 mL/min (ref >60.00)
Glucose, Bld: 117 mg/dL — ABNORMAL HIGH (ref 70–99)
Potassium: 5.1 meq/L (ref 3.5–5.1)
Sodium: 138 meq/L (ref 135–145)
Total Bilirubin: 0.7 mg/dL (ref 0.2–1.2)
Total Protein: 6.6 g/dL (ref 6.0–8.3)

## 2023-10-16 LAB — LIPID PANEL
Cholesterol: 197 mg/dL (ref 0–200)
HDL: 89.8 mg/dL (ref >39.00)
LDL Cholesterol: 95 mg/dL (ref 0–99)
NonHDL: 107.06
Total CHOL/HDL Ratio: 2
Triglycerides: 62 mg/dL (ref 0.0–149.0)
VLDL: 12.4 mg/dL (ref 0.0–40.0)

## 2023-10-16 LAB — HEMOGLOBIN A1C: Hgb A1c MFr Bld: 5.8 % (ref 4.6–6.5)

## 2023-10-16 LAB — TSH: TSH: 1.71 u[IU]/mL (ref 0.35–5.50)

## 2023-10-16 LAB — CBC WITH DIFFERENTIAL/PLATELET
Basophils Absolute: 0 10*3/uL (ref 0.0–0.1)
Basophils Relative: 1.1 % (ref 0.0–3.0)
Eosinophils Absolute: 0.1 10*3/uL (ref 0.0–0.7)
Eosinophils Relative: 2 % (ref 0.0–5.0)
HCT: 44.5 % (ref 39.0–52.0)
Hemoglobin: 15.1 g/dL (ref 13.0–17.0)
Lymphocytes Relative: 23.3 % (ref 12.0–46.0)
Lymphs Abs: 1 10*3/uL (ref 0.7–4.0)
MCHC: 34 g/dL (ref 30.0–36.0)
MCV: 93 fl (ref 78.0–100.0)
Monocytes Absolute: 0.4 10*3/uL (ref 0.1–1.0)
Monocytes Relative: 8.4 % (ref 3.0–12.0)
Neutro Abs: 2.8 10*3/uL (ref 1.4–7.7)
Neutrophils Relative %: 65.2 % (ref 43.0–77.0)
Platelets: 231 10*3/uL (ref 150.0–400.0)
RBC: 4.79 Mil/uL (ref 4.22–5.81)
RDW: 13.3 % (ref 11.5–15.5)
WBC: 4.4 10*3/uL (ref 4.0–10.5)

## 2023-10-16 MED ORDER — DIAZEPAM 2 MG PO TABS: 2.0000 mg | ORAL_TABLET | Freq: Every day | ORAL | 0 refills | Status: AC | PRN

## 2023-10-16 MED ORDER — MIRTAZAPINE 7.5 MG PO TABS: 7.5000 mg | ORAL_TABLET | Freq: Every day | ORAL | 3 refills | Status: AC

## 2023-10-16 NOTE — Assessment & Plan Note (Signed)
 Chronic Lab Results  Component Value Date   HGBA1C 5.8 09/27/2022   Check a1c Low sugar / carb diet Stressed regular exercise

## 2023-10-16 NOTE — Assessment & Plan Note (Signed)
H/o prostate cancer Check psa

## 2023-10-16 NOTE — Assessment & Plan Note (Signed)
 Chronic Regular exercise and healthy diet encouraged Check lipid panel, cmp, cbc, tsh  Continue lifestyle control

## 2023-10-16 NOTE — Telephone Encounter (Signed)
 error

## 2023-10-19 ENCOUNTER — Encounter: Payer: Self-pay | Admitting: Internal Medicine

## 2023-11-03 ENCOUNTER — Ambulatory Visit (INDEPENDENT_AMBULATORY_CARE_PROVIDER_SITE_OTHER)
Admission: RE | Admit: 2023-11-03 | Discharge: 2023-11-03 | Disposition: A | Discharge status: Home / Self Care | Source: Ambulatory Visit | Attending: Internal Medicine

## 2023-11-03 DIAGNOSIS — Z13820 Encounter for screening for osteoporosis: Secondary | ICD-10-CM

## 2023-11-05 DIAGNOSIS — C61 Malignant neoplasm of prostate: Secondary | ICD-10-CM | POA: Diagnosis not present

## 2023-11-09 ENCOUNTER — Encounter: Payer: Self-pay | Admitting: Internal Medicine

## 2023-11-09 DIAGNOSIS — M8580 Other specified disorders of bone density and structure, unspecified site: Secondary | ICD-10-CM | POA: Insufficient documentation

## 2023-11-10 DIAGNOSIS — K080 Exfoliation of teeth due to systemic causes: Secondary | ICD-10-CM | POA: Diagnosis not present

## 2023-11-13 DIAGNOSIS — L821 Other seborrheic keratosis: Secondary | ICD-10-CM | POA: Diagnosis not present

## 2023-11-13 DIAGNOSIS — L812 Freckles: Secondary | ICD-10-CM | POA: Diagnosis not present

## 2023-11-13 DIAGNOSIS — L570 Actinic keratosis: Secondary | ICD-10-CM | POA: Diagnosis not present

## 2023-11-13 DIAGNOSIS — D1801 Hemangioma of skin and subcutaneous tissue: Secondary | ICD-10-CM | POA: Diagnosis not present

## 2023-11-13 DIAGNOSIS — L820 Inflamed seborrheic keratosis: Secondary | ICD-10-CM | POA: Diagnosis not present

## 2023-11-17 ENCOUNTER — Encounter: Payer: Self-pay | Admitting: Cardiovascular Disease

## 2023-11-17 ENCOUNTER — Ambulatory Visit: Payer: Medicare Other | Attending: Cardiovascular Disease | Admitting: Cardiovascular Disease

## 2023-11-17 VITALS — BP 134/78 | HR 68 | Ht 69.0 in | Wt 160.8 lb

## 2023-11-17 DIAGNOSIS — I350 Nonrheumatic aortic (valve) stenosis: Secondary | ICD-10-CM

## 2023-11-17 NOTE — Patient Instructions (Signed)
 Medication Instructions:  No changes *If you need a refill on your cardiac medications before your next appointment, please call your pharmacy*  Lab Work: none If you have labs (blood work) drawn today and your tests are completely normal, you will receive your results only by: MyChart Message (if you have MyChart) OR A paper copy in the mail If you have any lab test that is abnormal or we need to change your treatment, we will call you to review the results.  Testing/Procedures: ECHO DUE IN [place] Your physician has requested that you have an echocardiogram. Echocardiography is a painless test that uses sound waves to create images of your heart. It provides your doctor with information about the size and shape of your heart and how well your heart's chambers and valves are working. This procedure takes approximately one hour. There are no restrictions for this procedure. Please do NOT wear cologne, perfume, aftershave, or lotions (deodorant is allowed). Please arrive 15 minutes prior to your appointment time.  Please note: We ask at that you not bring children with you during ultrasound (echo/ vascular) testing. Due to room size and safety concerns, children are not allowed in the ultrasound rooms during exams. Our front office staff cannot provide observation of children in our lobby area while testing is being conducted. An adult accompanying a patient to their appointment will only be allowed in the ultrasound room at the discretion of the ultrasound technician under special circumstances. We apologize for any inconvenience.   Follow-Up: At [facility], you and your health needs are our priority.  As part of our continuing mission to provide you with exceptional heart care, our providers are all part of one team.  This team includes your primary Cardiologist (physician) and Advanced Practice Providers or APPs (Physician Assistants and Nurse Practitioners) who all work together to  provide you with the care you need, when you need it.  Your next appointment:   12 month(s)  Provider:   Verne Carrow, MD        1st Floor: - Lobby - Registration  - Pharmacy  - Lab - Cafe  2nd Floor: - PV Lab - Diagnostic Testing (echo, CT, nuclear med)  3rd Floor: - Vacant  4th Floor: - TCTS (cardiothoracic surgery) - AFib Clinic - Structural Heart Clinic - Vascular Surgery  - Vascular Ultrasound  5th Floor: - HeartCare Cardiology (general and EP) - Clinical Pharmacy for coumadin, hypertension, lipid, weight-loss medications, and med management appointments    Valet parking services will be available as well.

## 2023-11-17 NOTE — Progress Notes (Signed)
 Chief Complaint  Patient presents with   Follow-up    Aortic stenosis   History of Present Illness: 78 yo male with history of mild aortic stenosis, anxiety and GERD who is here today for cardiac follow up. Stress myoview March 2016 with no ischemia. Echo July 2023 with LVEF =60-65% with trivial MR, mild aortic stenosis (mean gradient 15 mmHg). He is an avid cyclist.   He is here today for follow up. The patient denies any chest pain, dyspnea, palpitations, lower extremity edema, orthopnea, PND, dizziness, near syncope or syncope.   Primary Care Physician: Pincus Sanes, MD  Past Medical History:  Diagnosis Date   Anxiety 01/02/2011   GERD 05/24/2008   GILBERT'S SYNDROME 10/21/2008   Gynecomastia    Gynecomastia, male 07/22/2012   Heart murmur    History of colonic polyps 09/16/2014   MITRAL REGURGITATION 11/15/2008   Mild to moderate by echo 2010   Prostate cancer [facility])    Dr Laverle Patter   Spinal stenosis 01/02/2011    Past Surgical History:  Procedure Laterality Date   COLONOSCOPY     2010 per pt- normal per Mr. Orman    colonoscopy with polypectomy  1997   neg since; Dr [facility]   LUMBAR [facility] SURGERY  11/2010   L4-5 discectomy & laminectomy, Dr Danielle Dess    LUMBAR [facility] SURGERY  01/2011   L4-5 discectomy,Dr Elsner   LUMBAR DISC SURGERY  09/2011   L4-5 minimally invasive fusion , Dr Penne Lash, [facility]   POLYPECTOMY     1997 polyps    PROSTATECTOMY  2011   TOOTH EXTRACTION      Current Outpatient Medications  Medication Sig Dispense Refill   COLLAGEN PO Take by mouth. Patient takes 10 grams daily     CREATINE PO Take 5 g by mouth. 5 grams daily     diazepam (VALIUM) 2 MG tablet Take 1 tablet (2 mg total) by mouth daily as needed for anxiety. 45 tablet 0   Magnesium 100 MG CAPS Take 100 mg by mouth daily.     mirtazapine (REMERON) 7.5 MG tablet Take 1 tablet (7.5 mg total) by mouth at bedtime. 90 tablet 3   Multiple Vitamin (MULTIVITAMIN ADULT PO) Take by mouth.      No current facility-administered medications for this visit.    Allergies  Allergen Reactions   Lexapro [Escitalopram]    Prozac [Fluoxetine Hcl]     Ramped up anxiety    Zoloft [Sertraline Hcl] Other (See Comments)    "ramped anxiety up"  Same effect with Prozac & Lexapro    Social History   Socioeconomic History   Marital status: Married    Spouse name: Not on file   Number of children: 2   Years of education: Not on file   Highest education level: Some college, no degree  Occupational History   Occupation: Retired-works part time at [employer]: [employer]  Tobacco Use   Smoking status: Former    Current packs/day: 0.00    Average packs/day: 1 pack/day for 10.0 years (10.0 ttl pk-yrs)    Types: Cigarettes    Start date: 08/13/1963    Quit date: 08/12/1973    Years since quitting: 50.2   Smokeless tobacco: Former    Types: Chew   Tobacco comments:    smoked 1960-1975, up to 1 ppd  Vaping Use   Vaping status: Never Used  Substance and Sexual Activity  Alcohol use: Yes    Alcohol/week: 7.0 standard drinks of alcohol    Types: 7 Standard drinks or equivalent per week    Comment:  < 1/day   Drug use: No   Sexual activity: Not on file  Other Topics Concern   Not on file  Social History Narrative   Not on file   Social Drivers of Health   Financial Resource Strain: Low Risk  (10/15/2023)   Overall Financial Resource Strain (CARDIA)    Difficulty of Paying Living Expenses: Not hard at all  Food Insecurity: No Food Insecurity (10/15/2023)   Hunger Vital Sign    Worried About Running Out of Food in the Last Year: Never true    Ran Out of Food in the Last Year: Never true  Transportation Needs: No Transportation Needs (10/15/2023)   PRAPARE - Administrator, Civil Service (Medical): No    Lack of Transportation (Non-Medical): No  Physical Activity: Sufficiently Active (10/15/2023)   Exercise Vital Sign    Days of Exercise per Week: 6 days     Minutes of Exercise per Session: 150+ min  Stress: Patient Declined (10/15/2023)   [employer] of Occupational Health - Occupational Stress Questionnaire    Feeling of Stress : Patient declined  Social Connections: Unknown (10/15/2023)   Social Connection and Isolation Panel [NHANES]    Frequency of Communication with Friends and Family: More than three times a week    Frequency of Social Gatherings with Friends and Family: More than three times a week    Attends Religious Services: Patient declined    Database administrator or Organizations: Yes    Attends Banker Meetings: Patient declined    Marital Status: Married  Catering manager Violence: Not At Risk (02/25/2022)   Humiliation, Afraid, Rape, and Kick questionnaire    Fear of Current or Ex-Partner: No    Emotionally Abused: No    Physically Abused: No    Sexually Abused: No    Family History  Problem Relation Age of Onset   Anxiety disorder Mother    Diabetes Mother        not definite   Bladder Cancer Mother    Anxiety disorder Son    Anxiety disorder Brother    Atrial fibrillation Brother    Alcohol abuse Father    CAD Neg Hx    Stroke Neg Hx    Heart disease Neg Hx    Colon cancer Neg Hx    Colon polyps Neg Hx    Esophageal cancer Neg Hx    Rectal cancer Neg Hx    Stomach cancer Neg Hx    Review of Systems:  As stated in the HPI and otherwise negative.   BP 134/78   Pulse 68   Ht 5\' 9"  (1.753 m)   Wt 72.9 kg   SpO2 97%   BMI 23.75 kg/m   Physical Examination: General: Well developed, well nourished, NAD  HEENT: OP clear, mucus membranes moist  SKIN: warm, dry. No rashes. Neuro: No focal deficits  Musculoskeletal: Muscle strength 5/5 all ext  Psychiatric: Mood and affect normal  Neck: No JVD, no carotid bruits, no thyromegaly, no lymphadenopathy.  Lungs:Clear bilaterally, no wheezes, rhonci, crackles Cardiovascular: Regular rate and rhythm. Systolic murmur.  Abdomen:Soft. Bowel  sounds present. Non-tender.  Extremities: No lower extremity edema. Pulses are 2 + in the bilateral DP/PT.  Echo July 2023: 1. Left ventricular ejection fraction, by estimation, is 60 to 65%.  The  left ventricle has normal function. The left ventricle has no regional  wall motion abnormalities. There is mild asymmetric left ventricular  hypertrophy of the basal-septal segment.  Left ventricular diastolic parameters were normal. The average left  ventricular global longitudinal strain is -19.0 %. The global longitudinal  strain is normal.   2. Right ventricular systolic function is normal. The right ventricular  size is normal. There is normal pulmonary artery systolic pressure.   3. Left atrial size was moderately dilated.   4. Right atrial size was mildly dilated.   5. The mitral valve is degenerative. Trivial mitral valve regurgitation.   6. The tricuspid valve is degenerative.   7. AV mean gradient 15 mmhg. There is moderate calcification of the  aortic valve. Aortic valve regurgitation is mild. Mild aortic valve  stenosis.   8. Aortic no significant aortic root/ascending aortic aneurysm.   9. The inferior vena cava is normal in size with greater than 50%  respiratory variability, suggesting right atrial pressure of 3 mmHg.    EKG:  EKG is ordered today. The ekg ordered today demonstrates  EKG Interpretation Date/Time:  Monday November 17 2023 08:05:10 EDT Ventricular Rate:  56 PR Interval:  198 QRS Duration:  122 QT Interval:  430 QTC Calculation: 414 R Axis:   68  Text Interpretation: Sinus bradycardia RSR' or QR pattern in V1 suggests right ventricular conduction delay Poor R wave progression Poor R-wave progression Inferior T wave inversion Confirmed by Verne Carrow [phone]) on 11/17/2023 8:15:14 AM    Recent Labs: 10/16/2023: ALT 18; BUN 22; Creatinine, Ser 1.01; Hemoglobin 15.1; Platelets 231.0; Potassium 5.1; Sodium 138; TSH 1.71   Lipid Panel    Component Value  Date/Time   CHOL 197 10/16/2023 0905   TRIG 62.0 10/16/2023 0905   HDL 89.80 10/16/2023 0905   CHOLHDL 2 10/16/2023 0905   VLDL 12.4 10/16/2023 0905   LDLCALC 95 10/16/2023 0905   LDLDIRECT 119.9 09/24/2013 1125    Wt Readings from Last 3 Encounters:  11/17/23 72.9 kg  10/16/23 72.3 kg  10/28/22 74.8 kg    Assessment and Plan:   1. Aortic stenosis: Mild by echo in July 2023. Repeat echo July 2025.   2. PACs: No awareness of palpitations  3. Sinus bradycardia: No dizziness or fatigue  4. Incomplete RBBB: Unchanged  Labs/ tests ordered today include:   Orders Placed This Encounter  Procedures   EKG 12-Lead   ECHOCARDIOGRAM COMPLETE   Disposition:   F/U with me in 12  months  Signed, Verne Carrow, MD 11/17/2023 9:09 AM    [facility] [address] [place], [place], [place]  [address] Phone: [phone]; Fax: [phone]

## 2024-02-10 ENCOUNTER — Ambulatory Visit (INDEPENDENT_AMBULATORY_CARE_PROVIDER_SITE_OTHER): Admitting: Emergency Medicine

## 2024-02-10 ENCOUNTER — Encounter: Payer: Self-pay | Admitting: Emergency Medicine

## 2024-02-10 VITALS — BP 128/78 | HR 51 | Temp 97.9°F | Ht 69.0 in | Wt 158.0 lb

## 2024-02-10 DIAGNOSIS — R21 Rash and other nonspecific skin eruption: Secondary | ICD-10-CM | POA: Insufficient documentation

## 2024-02-10 MED ORDER — MUPIROCIN 2 % EX OINT
TOPICAL_OINTMENT | CUTANEOUS | 2 refills | Status: AC
Start: 2024-02-10 — End: ?

## 2024-02-10 NOTE — Progress Notes (Signed)
 Edward Mccall 78 y.o.   Chief Complaint  Patient presents with   Rash    Patient here for a bike injury that has turned into a knot. He states poking it to see if pus would come out. Patient states this happened 3 weeks ago from a bike pedal that he hit.     HISTORY OF PRESENT ILLNESS: This is a 78 y.o. male complaining of small skin lesion that started 3 weeks ago after getting poked with the bike pedal At some point thought perhaps there was pus so he punctured it with sterile needle but no pus came out. Other complaint or medical concerns today.  Rash Pertinent negatives include no congestion, cough, diarrhea, fever, shortness of breath, sore throat or vomiting.     Prior to Admission medications   Medication Sig Start Date End Date Taking? Authorizing Provider  COLLAGEN PO Take by mouth. Patient takes 10 grams daily   Yes [provider]  CREATINE PO Take 5 g by mouth. 5 grams daily   Yes [provider]  diazepam  (VALIUM ) 2 MG tablet Take 1 tablet (2 mg total) by mouth daily as needed for anxiety. 10/16/23  Yes Burns, Glade PARAS, MD  Magnesium 100 MG CAPS Take 100 mg by mouth daily.   Yes [provider]  mirtazapine  (REMERON ) 7.5 MG tablet Take 1 tablet (7.5 mg total) by mouth at bedtime. 10/16/23  Yes Geofm Glade PARAS, MD  Multiple Vitamin (MULTIVITAMIN ADULT PO) Take by mouth.   Yes [provider]    Allergies  Allergen Reactions   Lexapro [Escitalopram]    Prozac  [Fluoxetine  Hcl]     Ramped up anxiety    Zoloft  [Sertraline  Hcl] Other (See Comments)    ramped anxiety up  Same effect with Prozac  & Lexapro    Patient Active Problem List   Diagnosis Date Noted   Osteopenia 11/09/2023   Aortic valve disorder, mild AR, mild AS 10/16/2023   Benign paroxysmal positional vertigo 05/31/2022   Nuclear sclerotic cataract of left eye 11/07/2021   Right epiretinal membrane 08/15/2021   Testosterone  deficiency in male 06/30/2017   History of  colonic polyps 09/16/2014   Hyperlipidemia 09/24/2013   Degeneration of intervertebral disc of lumbosacral region 10/29/2011   Lipoma 04/06/2011   Anxiety 01/02/2011   Spinal stenosis 01/02/2011   PROSTATE CANCER, HX OF 06/20/2010   MITRAL REGURGITATION, trivial 11/15/2008   Supraventricular premature beats 11/15/2008   GILBERT'S SYNDROME 10/21/2008   Prediabetes 10/21/2008    Past Medical History:  Diagnosis Date   Anxiety 01/02/2011   GERD 05/24/2008   GILBERT'S SYNDROME 10/21/2008   Gynecomastia    Gynecomastia, male 07/22/2012   Heart murmur    History of colonic polyps 09/16/2014   MITRAL REGURGITATION 11/15/2008   Mild to moderate by echo 2010   Prostate cancer [facility])    Dr Renda   Spinal stenosis 01/02/2011    Past Surgical History:  Procedure Laterality Date   COLONOSCOPY     2010 per pt- normal per Mr. Grobe    colonoscopy with polypectomy  1997   neg since; Dr [facility]   LUMBAR [facility] SURGERY  11/2010   L4-5 discectomy & laminectomy, Dr Colon    LUMBAR [facility] SURGERY  01/2011   L4-5 discectomy,Dr Elsner   LUMBAR DISC SURGERY  09/2011   L4-5 minimally invasive fusion , Dr Janene, [facility]   POLYPECTOMY     1997 polyps    PROSTATECTOMY  2011  TOOTH EXTRACTION      Social History   Socioeconomic History   Marital status: Married    Spouse name: Not on file   Number of children: 2   Years of education: Not on file   Highest education level: Some college, no degree  Occupational History   Occupation: Retired-works part time at [employer]: [employer]  Tobacco Use   Smoking status: Former    Current packs/day: 0.00    Average packs/day: 1 pack/day for 10.0 years (10.0 ttl pk-yrs)    Types: Cigarettes    Start date: 08/13/1963    Quit date: 08/12/1973    Years since quitting: 50.5   Smokeless tobacco: Former    Types: Chew   Tobacco comments:    smoked 1960-1975, up to 1 ppd  Vaping Use   Vaping status: Never Used  Substance  and Sexual Activity   Alcohol use: Yes    Alcohol/week: 7.0 standard drinks of alcohol    Types: 7 Standard drinks or equivalent per week    Comment:  < 1/day   Drug use: No   Sexual activity: Not on file  Other Topics Concern   Not on file  Social History Narrative   Not on file   Social Drivers of Health   Financial Resource Strain: Low Risk  (10/15/2023)   Overall Financial Resource Strain (CARDIA)    Difficulty of Paying Living Expenses: Not hard at all  Food Insecurity: No Food Insecurity (10/15/2023)   Hunger Vital Sign    Worried About Running Out of Food in the Last Year: Never true    Ran Out of Food in the Last Year: Never true  Transportation Needs: No Transportation Needs (10/15/2023)   PRAPARE - Administrator, Civil Service (Medical): No    Lack of Transportation (Non-Medical): No  Physical Activity: Sufficiently Active (10/15/2023)   Exercise Vital Sign    Days of Exercise per Week: 6 days    Minutes of Exercise per Session: 150+ min  Stress: Patient Declined (10/15/2023)   [employer] of Occupational Health - Occupational Stress Questionnaire    Feeling of Stress : Patient declined  Social Connections: Unknown (10/15/2023)   Social Connection and Isolation Panel    Frequency of Communication with Friends and Family: More than three times a week    Frequency of Social Gatherings with Friends and Family: More than three times a week    Attends Religious Services: Patient declined    Database administrator or Organizations: Yes    Attends Banker Meetings: Patient declined    Marital Status: Married  Catering manager Violence: Not At Risk (02/25/2022)   Humiliation, Afraid, Rape, and Kick questionnaire    Fear of Current or Ex-Partner: No    Emotionally Abused: No    Physically Abused: No    Sexually Abused: No    Family History  Problem Relation Age of Onset   Anxiety disorder Mother    Diabetes Mother        not definite   Bladder  Cancer Mother    Anxiety disorder Son    Anxiety disorder Brother    Atrial fibrillation Brother    Alcohol abuse Father    CAD Neg Hx    Stroke Neg Hx    Heart disease Neg Hx    Colon cancer Neg Hx    Colon polyps Neg Hx    Esophageal cancer Neg Hx  Rectal cancer Neg Hx    Stomach cancer Neg Hx      Review of Systems  Constitutional: Negative.  Negative for chills and fever.  HENT:  Negative for congestion and sore throat.   Respiratory: Negative.  Negative for cough and shortness of breath.   Cardiovascular:  Negative for chest pain and palpitations.  Gastrointestinal:  Negative for abdominal pain, diarrhea, nausea and vomiting.  Genitourinary: Negative.  Negative for dysuria and hematuria.  Skin:  Positive for rash.  Neurological: Negative.  Negative for dizziness and headaches.    Today's Vitals   02/10/24 0759  BP: 128/78  Pulse: (!) 51  Temp: 97.9 F (36.6 C)  TempSrc: Oral  SpO2: 98%  Weight: 158 lb (71.7 kg)  Height: 5' 9 (1.753 m)   Body mass index is 23.33 kg/m.   Physical Exam Vitals reviewed.  Constitutional:      Appearance: Normal appearance.  HENT:     Head: Normocephalic.   Eyes:     Extraocular Movements: Extraocular movements intact.    Cardiovascular:     Rate and Rhythm: Normal rate.  Pulmonary:     Effort: Pulmonary effort is normal.   Skin:    General: Skin is warm and dry.     Findings: Lesion present.     Comments: Small, round, erythematous, nontender and nonfluctuant protruding mass.   Neurological:     Mental Status: He is alert and oriented to person, place, and time.   Psychiatric:        Mood and Affect: Mood normal.        Behavior: Behavior normal.     ASSESSMENT & PLAN: A total of 30 minutes was spent with the patient and counseling/coordination of care regarding preparing for this visit, review of most recent office visit notes, review of multiple chronic medical conditions under management, review of all  medications, diagnosis of skin rash/lesion and management, prognosis, documentation, and need for follow-up if no better or worse during the next several weeks.  Problem List Items Addressed This Visit       Musculoskeletal and Integument   Rash and nonspecific skin eruption - Primary   Differential diagnosis discussed No red flag signs or symptoms Likely foreign body reaction.  No sign of abscess formation Some inflammatory changes noted Hard and nontender.  Nonfluctuant. Recommend Bactroban twice a day for 7 to 10 days Advised to follow-up with PCP if no better or worse during the next several weeks. May need referral to dermatologist.      Patient Instructions  Health Maintenance After Age 80 After age 64, you are at a higher risk for certain long-term diseases and infections as well as injuries from falls. Falls are a major cause of broken bones and head injuries in people who are older than age 2. Getting regular preventive care can help to keep you healthy and well. Preventive care includes getting regular testing and making lifestyle changes as recommended by your health care provider. Talk with your health care provider about: Which screenings and tests you should have. A screening is a test that checks for a disease when you have no symptoms. A diet and exercise plan that is right for you. What should I know about screenings and tests to prevent falls? Screening and testing are the best ways to find a health problem early. Early diagnosis and treatment give you the best chance of managing medical conditions that are common after age 70. Certain conditions and lifestyle choices  may make you more likely to have a fall. Your health care provider may recommend: Regular vision checks. Poor vision and conditions such as cataracts can make you more likely to have a fall. If you wear glasses, make sure to get your prescription updated if your vision changes. Medicine review. Work with  your health care provider to regularly review all of the medicines you are taking, including over-the-counter medicines. Ask your health care provider about any side effects that may make you more likely to have a fall. Tell your health care provider if any medicines that you take make you feel dizzy or sleepy. Strength and balance checks. Your health care provider may recommend certain tests to check your strength and balance while standing, walking, or changing positions. Foot health exam. Foot pain and numbness, as well as not wearing proper footwear, can make you more likely to have a fall. Screenings, including: Osteoporosis screening. Osteoporosis is a condition that causes the bones to get weaker and break more easily. Blood pressure screening. Blood pressure changes and medicines to control blood pressure can make you feel dizzy. Depression screening. You may be more likely to have a fall if you have a fear of falling, feel depressed, or feel unable to do activities that you used to do. Alcohol use screening. Using too much alcohol can affect your balance and may make you more likely to have a fall. Follow these instructions at home: Lifestyle Do not drink alcohol if: Your health care provider tells you not to drink. If you drink alcohol: Limit how much you have to: 0-1 drink a day for women. 0-2 drinks a day for men. Know how much alcohol is in your drink. In the [place] one drink equals one 12 oz bottle of beer (355 mL), one 5 oz glass of wine (148 mL), or one 1 oz glass of hard liquor (44 mL). Do not use any products that contain nicotine or tobacco. These products include cigarettes, chewing tobacco, and vaping devices, such as e-cigarettes. If you need help quitting, ask your health care provider. Activity  Follow a regular exercise program to stay fit. This will help you maintain your balance. Ask your health care provider what types of exercise are appropriate for you. If you need  a cane or walker, use it as recommended by your health care provider. Wear supportive shoes that have nonskid soles. Safety  Remove any tripping hazards, such as rugs, cords, and clutter. Install safety equipment such as grab bars in bathrooms and safety rails on stairs. Keep rooms and walkways well-lit. General instructions Talk with your health care provider about your risks for falling. Tell your health care provider if: You fall. Be sure to tell your health care provider about all falls, even ones that seem minor. You feel dizzy, tiredness (fatigue), or off-balance. Take over-the-counter and prescription medicines only as told by your health care provider. These include supplements. Eat a healthy diet and maintain a healthy weight. A healthy diet includes low-fat dairy products, low-fat (lean) meats, and fiber from whole grains, beans, and lots of fruits and vegetables. Stay current with your vaccines. Schedule regular health, dental, and eye exams. Summary Having a healthy lifestyle and getting preventive care can help to protect your health and wellness after age 56. Screening and testing are the best way to find a health problem early and help you avoid having a fall. Early diagnosis and treatment give you the best chance for managing medical conditions that are more  common for people who are older than age 22. Falls are a major cause of broken bones and head injuries in people who are older than age 9. Take precautions to prevent a fall at home. Work with your health care provider to learn what changes you can make to improve your health and wellness and to prevent falls. This information is not intended to replace advice given to you by your health care provider. Make sure you discuss any questions you have with your health care provider. Document Revised: 12/18/2020 Document Reviewed: 12/18/2020 Elsevier Patient Education  2024 Elsevier Inc.    Emil Schaumann, MD [facility]  [facility] at [facility]

## 2024-02-10 NOTE — Assessment & Plan Note (Signed)
 Differential diagnosis discussed No red flag signs or symptoms Likely foreign body reaction.  No sign of abscess formation Some inflammatory changes noted Hard and nontender.  Nonfluctuant. Recommend Bactroban twice a day for 7 to 10 days Advised to follow-up with PCP if no better or worse during the next several weeks. May need referral to dermatologist.

## 2024-02-10 NOTE — Patient Instructions (Signed)
 Health Maintenance After Age 79 After age 4, you are at a higher risk for certain long-term diseases and infections as well as injuries from falls. Falls are a major cause of broken bones and head injuries in people who are older than age 47. Getting regular preventive care can help to keep you healthy and well. Preventive care includes getting regular testing and making lifestyle changes as recommended by your health care provider. Talk with your health care provider about: Which screenings and tests you should have. A screening is a test that checks for a disease when you have no symptoms. A diet and exercise plan that is right for you. What should I know about screenings and tests to prevent falls? Screening and testing are the best ways to find a health problem early. Early diagnosis and treatment give you the best chance of managing medical conditions that are common after age 37. Certain conditions and lifestyle choices may make you more likely to have a fall. Your health care provider may recommend: Regular vision checks. Poor vision and conditions such as cataracts can make you more likely to have a fall. If you wear glasses, make sure to get your prescription updated if your vision changes. Medicine review. Work with your health care provider to regularly review all of the medicines you are taking, including over-the-counter medicines. Ask your health care provider about any side effects that may make you more likely to have a fall. Tell your health care provider if any medicines that you take make you feel dizzy or sleepy. Strength and balance checks. Your health care provider may recommend certain tests to check your strength and balance while standing, walking, or changing positions. Foot health exam. Foot pain and numbness, as well as not wearing proper footwear, can make you more likely to have a fall. Screenings, including: Osteoporosis screening. Osteoporosis is a condition that causes  the bones to get weaker and break more easily. Blood pressure screening. Blood pressure changes and medicines to control blood pressure can make you feel dizzy. Depression screening. You may be more likely to have a fall if you have a fear of falling, feel depressed, or feel unable to do activities that you used to do. Alcohol use screening. Using too much alcohol can affect your balance and may make you more likely to have a fall. Follow these instructions at home: Lifestyle Do not drink alcohol if: Your health care provider tells you not to drink. If you drink alcohol: Limit how much you have to: 0-1 drink a day for women. 0-2 drinks a day for men. Know how much alcohol is in your drink. In the [place] one drink equals one 12 oz bottle of beer (355 mL), one 5 oz glass of wine (148 mL), or one 1 oz glass of hard liquor (44 mL). Do not use any products that contain nicotine or tobacco. These products include cigarettes, chewing tobacco, and vaping devices, such as e-cigarettes. If you need help quitting, ask your health care provider. Activity  Follow a regular exercise program to stay fit. This will help you maintain your balance. Ask your health care provider what types of exercise are appropriate for you. If you need a cane or walker, use it as recommended by your health care provider. Wear supportive shoes that have nonskid soles. Safety  Remove any tripping hazards, such as rugs, cords, and clutter. Install safety equipment such as grab bars in bathrooms and safety rails on stairs. Keep rooms and walkways  well-lit. General instructions Talk with your health care provider about your risks for falling. Tell your health care provider if: You fall. Be sure to tell your health care provider about all falls, even ones that seem minor. You feel dizzy, tiredness (fatigue), or off-balance. Take over-the-counter and prescription medicines only as told by your health care provider. These include  supplements. Eat a healthy diet and maintain a healthy weight. A healthy diet includes low-fat dairy products, low-fat (lean) meats, and fiber from whole grains, beans, and lots of fruits and vegetables. Stay current with your vaccines. Schedule regular health, dental, and eye exams. Summary Having a healthy lifestyle and getting preventive care can help to protect your health and wellness after age 11. Screening and testing are the best way to find a health problem early and help you avoid having a fall. Early diagnosis and treatment give you the best chance for managing medical conditions that are more common for people who are older than age 28. Falls are a major cause of broken bones and head injuries in people who are older than age 48. Take precautions to prevent a fall at home. Work with your health care provider to learn what changes you can make to improve your health and wellness and to prevent falls. This information is not intended to replace advice given to you by your health care provider. Make sure you discuss any questions you have with your health care provider. Document Revised: 12/18/2020 Document Reviewed: 12/18/2020 Elsevier Patient Education  2024 [employer].

## 2024-02-25 DIAGNOSIS — C61 Malignant neoplasm of prostate: Secondary | ICD-10-CM | POA: Diagnosis not present

## 2024-03-01 DIAGNOSIS — C44722 Squamous cell carcinoma of skin of right lower limb, including hip: Secondary | ICD-10-CM | POA: Diagnosis not present

## 2024-03-03 DIAGNOSIS — C61 Malignant neoplasm of prostate: Secondary | ICD-10-CM | POA: Diagnosis not present

## 2024-03-18 ENCOUNTER — Ambulatory Visit (HOSPITAL_COMMUNITY)
Admission: RE | Admit: 2024-03-18 | Discharge: 2024-03-18 | Disposition: A | Discharge status: Home / Self Care | Source: Ambulatory Visit | Attending: Cardiology | Admitting: Cardiology

## 2024-03-18 DIAGNOSIS — I350 Nonrheumatic aortic (valve) stenosis: Secondary | ICD-10-CM | POA: Insufficient documentation

## 2024-03-18 LAB — ECHOCARDIOGRAM COMPLETE
AR max vel: 1.41 cm2
AV Area VTI: 1.28 cm2
AV Area mean vel: 1.29 cm2
AV Mean grad: 21.8 mmHg
AV Peak grad: 39.1 mmHg
Ao pk vel: 3.13 m/s
Area-P 1/2: 2.73 cm2
P 1/2 time: 759 ms
S' Lateral: 2.8 cm

## 2024-03-19 ENCOUNTER — Ambulatory Visit: Payer: Self-pay | Admitting: Cardiovascular Disease

## 2024-05-19 DIAGNOSIS — K080 Exfoliation of teeth due to systemic causes: Secondary | ICD-10-CM | POA: Diagnosis not present

## 2024-05-31 DIAGNOSIS — L570 Actinic keratosis: Secondary | ICD-10-CM | POA: Diagnosis not present

## 2024-05-31 DIAGNOSIS — L905 Scar conditions and fibrosis of skin: Secondary | ICD-10-CM | POA: Diagnosis not present

## 2024-05-31 DIAGNOSIS — Z85828 Personal history of other malignant neoplasm of skin: Secondary | ICD-10-CM | POA: Diagnosis not present

## 2024-06-03 DIAGNOSIS — C61 Malignant neoplasm of prostate: Secondary | ICD-10-CM | POA: Diagnosis not present

## 2024-06-10 ENCOUNTER — Other Ambulatory Visit (HOSPITAL_COMMUNITY): Payer: Self-pay

## 2024-06-10 MED ORDER — COMIRNATY 30 MCG/0.3ML IM SUSY
0.3000 mL | PREFILLED_SYRINGE | Freq: Once | INTRAMUSCULAR | 0 refills | Status: AC
  Filled 2024-06-10: qty 0.3, 1d supply, fill #0

## 2024-06-10 MED ORDER — FLUZONE HIGH-DOSE 0.5 ML IM SUSY
0.5000 mL | PREFILLED_SYRINGE | Freq: Once | INTRAMUSCULAR | 0 refills | Status: AC
  Filled 2024-06-10: qty 0.5, 1d supply, fill #0

## 2024-07-13 DIAGNOSIS — H6123 Impacted cerumen, bilateral: Secondary | ICD-10-CM | POA: Diagnosis not present

## 2024-07-29 DIAGNOSIS — K080 Exfoliation of teeth due to systemic causes: Secondary | ICD-10-CM | POA: Diagnosis not present

## 2024-10-19 ENCOUNTER — Encounter: Admitting: Internal Medicine

## 2024-11-18 ENCOUNTER — Ambulatory Visit: Admitting: Cardiovascular Disease
